# Patient Record
Sex: Female | Born: 1970
Health system: Southern US, Community
[De-identification: ages and names within clinical notes are randomized; demographics above are authoritative.]

## PROBLEM LIST (undated history)

## (undated) DIAGNOSIS — G4730 Sleep apnea, unspecified: Secondary | ICD-10-CM

## (undated) DIAGNOSIS — J45909 Unspecified asthma, uncomplicated: Secondary | ICD-10-CM

## (undated) DIAGNOSIS — E119 Type 2 diabetes mellitus without complications: Secondary | ICD-10-CM

## (undated) DIAGNOSIS — I10 Essential (primary) hypertension: Secondary | ICD-10-CM

## (undated) DIAGNOSIS — R112 Nausea with vomiting, unspecified: Secondary | ICD-10-CM

## (undated) DIAGNOSIS — F32A Depression, unspecified: Secondary | ICD-10-CM

## (undated) DIAGNOSIS — Z9889 Other specified postprocedural states: Secondary | ICD-10-CM

## (undated) DIAGNOSIS — T8859XA Other complications of anesthesia, initial encounter: Secondary | ICD-10-CM

## (undated) DIAGNOSIS — T4145XA Adverse effect of unspecified anesthetic, initial encounter: Secondary | ICD-10-CM

## (undated) DIAGNOSIS — F329 Major depressive disorder, single episode, unspecified: Secondary | ICD-10-CM

## (undated) DIAGNOSIS — L719 Rosacea, unspecified: Secondary | ICD-10-CM

## (undated) DIAGNOSIS — K469 Unspecified abdominal hernia without obstruction or gangrene: Secondary | ICD-10-CM

## (undated) DIAGNOSIS — F419 Anxiety disorder, unspecified: Secondary | ICD-10-CM

## (undated) DIAGNOSIS — K56609 Unspecified intestinal obstruction, unspecified as to partial versus complete obstruction: Secondary | ICD-10-CM

## (undated) DIAGNOSIS — K219 Gastro-esophageal reflux disease without esophagitis: Secondary | ICD-10-CM

## (undated) DIAGNOSIS — E282 Polycystic ovarian syndrome: Secondary | ICD-10-CM

## (undated) DIAGNOSIS — G43909 Migraine, unspecified, not intractable, without status migrainosus: Secondary | ICD-10-CM

## (undated) DIAGNOSIS — K589 Irritable bowel syndrome without diarrhea: Secondary | ICD-10-CM

## (undated) HISTORY — PX: OTHER SURGICAL HISTORY: SHX169

## (undated) HISTORY — PX: TONSILECTOMY, ADENOIDECTOMY, BILATERAL MYRINGOTOMY AND TUBES: SHX2538

## (undated) HISTORY — PX: ADENOIDECTOMY: SUR15

## (undated) HISTORY — DX: Anxiety disorder, unspecified: F41.9

## (undated) HISTORY — DX: Morbid (severe) obesity due to excess calories: E66.01

## (undated) HISTORY — PX: UMBILICAL HERNIA REPAIR: SHX196

## (undated) HISTORY — DX: Type 2 diabetes mellitus without complications: E11.9

## (undated) HISTORY — DX: Rosacea, unspecified: L71.9

## (undated) HISTORY — DX: Essential (primary) hypertension: I10

## (undated) HISTORY — DX: Major depressive disorder, single episode, unspecified: F32.9

## (undated) HISTORY — PX: BACK SURGERY: SHX140

## (undated) HISTORY — PX: HERNIA REPAIR: SHX51

## (undated) HISTORY — DX: Migraine, unspecified, not intractable, without status migrainosus: G43.909

## (undated) HISTORY — DX: Polycystic ovarian syndrome: E28.2

## (undated) HISTORY — DX: Irritable bowel syndrome, unspecified: K58.9

## (undated) HISTORY — DX: Sleep apnea, unspecified: G47.30

## (undated) HISTORY — DX: Gastro-esophageal reflux disease without esophagitis: K21.9

## (undated) HISTORY — DX: Unspecified asthma, uncomplicated: J45.909

## (undated) HISTORY — DX: Unspecified abdominal hernia without obstruction or gangrene: K46.9

## (undated) HISTORY — DX: Depression, unspecified: F32.A

---
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
## 1999-11-16 ENCOUNTER — Other Ambulatory Visit: Admission: RE | Admit: 1999-11-16 | Discharge: 1999-11-16 | Payer: Self-pay | Admitting: Obstetrics and Gynecology

## 2002-02-07 ENCOUNTER — Emergency Department (HOSPITAL_COMMUNITY): Admission: EM | Admit: 2002-02-07 | Discharge: 2002-02-07 | Payer: Self-pay | Admitting: Emergency Medicine

## 2002-02-12 ENCOUNTER — Encounter: Admission: RE | Admit: 2002-02-12 | Discharge: 2002-02-12 | Payer: Self-pay | Admitting: Family Medicine

## 2002-02-12 ENCOUNTER — Encounter: Payer: Self-pay | Admitting: Family Medicine

## 2002-03-03 ENCOUNTER — Encounter: Admission: RE | Admit: 2002-03-03 | Discharge: 2002-04-30 | Payer: Self-pay | Admitting: Family Medicine

## 2002-06-17 ENCOUNTER — Other Ambulatory Visit: Admission: RE | Admit: 2002-06-17 | Discharge: 2002-06-17 | Payer: Self-pay | Admitting: Gynecology

## 2003-06-13 ENCOUNTER — Other Ambulatory Visit: Admission: RE | Admit: 2003-06-13 | Discharge: 2003-06-13 | Payer: Self-pay | Admitting: Gynecology

## 2003-08-27 ENCOUNTER — Emergency Department (HOSPITAL_COMMUNITY): Admission: EM | Admit: 2003-08-27 | Discharge: 2003-08-27 | Payer: Self-pay | Admitting: Emergency Medicine

## 2004-06-11 ENCOUNTER — Emergency Department (HOSPITAL_COMMUNITY): Admission: EM | Admit: 2004-06-11 | Discharge: 2004-06-11 | Payer: Self-pay | Admitting: Family Medicine

## 2008-02-11 ENCOUNTER — Ambulatory Visit (HOSPITAL_COMMUNITY): Admission: RE | Admit: 2008-02-11 | Discharge: 2008-02-11 | Payer: Self-pay | Admitting: Obstetrics & Gynecology

## 2009-05-30 ENCOUNTER — Ambulatory Visit (HOSPITAL_COMMUNITY): Admission: RE | Admit: 2009-05-30 | Discharge: 2009-05-30 | Payer: Self-pay | Admitting: General Surgery

## 2010-12-07 LAB — DIFFERENTIAL
Basophils Absolute: 0.1 10*3/uL (ref 0.0–0.1)
Basophils Relative: 1 % (ref 0–1)
Eosinophils Absolute: 0.4 10*3/uL (ref 0.0–0.7)
Eosinophils Relative: 3 % (ref 0–5)
Lymphocytes Relative: 29 % (ref 12–46)
Lymphs Abs: 3.4 10*3/uL (ref 0.7–4.0)
Monocytes Absolute: 0.8 10*3/uL (ref 0.1–1.0)
Monocytes Relative: 7 % (ref 3–12)
Neutro Abs: 7 10*3/uL (ref 1.7–7.7)
Neutrophils Relative %: 60 % (ref 43–77)

## 2010-12-07 LAB — BASIC METABOLIC PANEL
BUN: 13 mg/dL (ref 6–23)
CO2: 31 mEq/L (ref 19–32)
Calcium: 9.8 mg/dL (ref 8.4–10.5)
Chloride: 100 mEq/L (ref 96–112)
Creatinine, Ser: 0.8 mg/dL (ref 0.4–1.2)
GFR calc Af Amer: >60 mL/min (ref >60)
GFR calc non Af Amer: >60 mL/min (ref >60)
Glucose, Bld: 93 mg/dL (ref 70–99)
Potassium: 4.7 mEq/L (ref 3.5–5.1)
Sodium: 138 mEq/L (ref 135–145)

## 2010-12-07 LAB — CBC
HCT: 45.5 % (ref 36.0–46.0)
Hemoglobin: 15.3 g/dL — ABNORMAL HIGH (ref 12.0–15.0)
MCHC: 33.7 g/dL (ref 30.0–36.0)
MCV: 88.7 fL (ref 78.0–100.0)
Platelets: 293 10*3/uL (ref 150–400)
RBC: 5.13 MIL/uL — ABNORMAL HIGH (ref 3.87–5.11)
RDW: 13.7 % (ref 11.5–15.5)
WBC: 11.7 10*3/uL — ABNORMAL HIGH (ref 4.0–10.5)

## 2010-12-07 LAB — GLUCOSE, CAPILLARY
Glucose-Capillary: 105 mg/dL — ABNORMAL HIGH (ref 70–99)
Glucose-Capillary: 163 mg/dL — ABNORMAL HIGH (ref 70–99)

## 2011-03-15 ENCOUNTER — Other Ambulatory Visit (INDEPENDENT_AMBULATORY_CARE_PROVIDER_SITE_OTHER): Payer: Self-pay | Admitting: General Surgery

## 2011-03-15 ENCOUNTER — Ambulatory Visit (HOSPITAL_COMMUNITY)
Admission: RE | Admit: 2011-03-15 | Discharge: 2011-03-15 | Disposition: A | Discharge status: Home / Self Care | Payer: 59 | Source: Ambulatory Visit | Attending: General Surgery | Admitting: General Surgery

## 2011-03-15 ENCOUNTER — Encounter (HOSPITAL_COMMUNITY)
Admission: RE | Admit: 2011-03-15 | Discharge: 2011-03-15 | Disposition: A | Discharge status: Home / Self Care | Payer: 59 | Source: Ambulatory Visit | Attending: General Surgery | Admitting: General Surgery

## 2011-03-15 DIAGNOSIS — R0989 Other specified symptoms and signs involving the circulatory and respiratory systems: Secondary | ICD-10-CM | POA: Insufficient documentation

## 2011-03-15 DIAGNOSIS — R05 Cough: Secondary | ICD-10-CM | POA: Insufficient documentation

## 2011-03-15 DIAGNOSIS — Z01811 Encounter for preprocedural respiratory examination: Secondary | ICD-10-CM

## 2011-03-15 DIAGNOSIS — Z01810 Encounter for preprocedural cardiovascular examination: Secondary | ICD-10-CM | POA: Insufficient documentation

## 2011-03-15 DIAGNOSIS — R059 Cough, unspecified: Secondary | ICD-10-CM | POA: Insufficient documentation

## 2011-03-15 DIAGNOSIS — Z01818 Encounter for other preprocedural examination: Secondary | ICD-10-CM | POA: Insufficient documentation

## 2011-03-15 DIAGNOSIS — K439 Ventral hernia without obstruction or gangrene: Secondary | ICD-10-CM

## 2011-03-15 DIAGNOSIS — Z01812 Encounter for preprocedural laboratory examination: Secondary | ICD-10-CM | POA: Insufficient documentation

## 2011-03-15 DIAGNOSIS — E119 Type 2 diabetes mellitus without complications: Secondary | ICD-10-CM | POA: Insufficient documentation

## 2011-03-15 DIAGNOSIS — I10 Essential (primary) hypertension: Secondary | ICD-10-CM | POA: Insufficient documentation

## 2011-03-15 LAB — CBC
HCT: 44 % (ref 36.0–46.0)
Hemoglobin: 15.5 g/dL — ABNORMAL HIGH (ref 12.0–15.0)
MCH: 30.6 pg (ref 26.0–34.0)
MCHC: 35.2 g/dL (ref 30.0–36.0)
MCV: 87 fL (ref 78.0–100.0)
Platelets: 245 10*3/uL (ref 150–400)
RBC: 5.06 MIL/uL (ref 3.87–5.11)
RDW: 13.1 % (ref 11.5–15.5)
WBC: 10.9 10*3/uL — ABNORMAL HIGH (ref 4.0–10.5)

## 2011-03-15 LAB — DIFFERENTIAL
Basophils Absolute: 0.1 10*3/uL (ref 0.0–0.1)
Basophils Relative: 1 % (ref 0–1)
Eosinophils Absolute: 0.5 10*3/uL (ref 0.0–0.7)
Eosinophils Relative: 5 % (ref 0–5)
Lymphocytes Relative: 27 % (ref 12–46)
Lymphs Abs: 3 10*3/uL (ref 0.7–4.0)
Monocytes Absolute: 0.7 10*3/uL (ref 0.1–1.0)
Monocytes Relative: 6 % (ref 3–12)
Neutro Abs: 6.7 10*3/uL (ref 1.7–7.7)
Neutrophils Relative %: 61 % (ref 43–77)

## 2011-03-15 LAB — BASIC METABOLIC PANEL
BUN: 15 mg/dL (ref 6–23)
CO2: 29 mEq/L (ref 19–32)
Calcium: 9.9 mg/dL (ref 8.4–10.5)
Chloride: 102 mEq/L (ref 96–112)
Creatinine, Ser: 0.72 mg/dL (ref 0.50–1.10)
GFR calc Af Amer: >60 mL/min (ref >60)
GFR calc non Af Amer: >60 mL/min (ref >60)
Glucose, Bld: 86 mg/dL (ref 70–99)
Potassium: 4.3 mEq/L (ref 3.5–5.1)
Sodium: 141 mEq/L (ref 135–145)

## 2011-03-15 LAB — SURGICAL PCR SCREEN
MRSA, PCR: NEGATIVE
Staphylococcus aureus: POSITIVE — AB

## 2011-03-18 ENCOUNTER — Ambulatory Visit (HOSPITAL_COMMUNITY)
Admission: RE | Admit: 2011-03-18 | Discharge: 2011-03-20 | Disposition: A | Discharge status: Home / Self Care | Payer: 59 | Source: Ambulatory Visit | Attending: General Surgery | Admitting: General Surgery

## 2011-03-18 DIAGNOSIS — K432 Incisional hernia without obstruction or gangrene: Secondary | ICD-10-CM | POA: Insufficient documentation

## 2011-03-18 DIAGNOSIS — Z01812 Encounter for preprocedural laboratory examination: Secondary | ICD-10-CM | POA: Insufficient documentation

## 2011-03-18 DIAGNOSIS — M129 Arthropathy, unspecified: Secondary | ICD-10-CM | POA: Insufficient documentation

## 2011-03-18 DIAGNOSIS — Z87891 Personal history of nicotine dependence: Secondary | ICD-10-CM | POA: Insufficient documentation

## 2011-03-18 DIAGNOSIS — E119 Type 2 diabetes mellitus without complications: Secondary | ICD-10-CM | POA: Insufficient documentation

## 2011-03-18 DIAGNOSIS — J45909 Unspecified asthma, uncomplicated: Secondary | ICD-10-CM | POA: Insufficient documentation

## 2011-03-18 DIAGNOSIS — I10 Essential (primary) hypertension: Secondary | ICD-10-CM | POA: Insufficient documentation

## 2011-03-18 DIAGNOSIS — E669 Obesity, unspecified: Secondary | ICD-10-CM | POA: Insufficient documentation

## 2011-03-18 DIAGNOSIS — K219 Gastro-esophageal reflux disease without esophagitis: Secondary | ICD-10-CM | POA: Insufficient documentation

## 2011-03-18 LAB — GLUCOSE, CAPILLARY
Glucose-Capillary: 103 mg/dL — ABNORMAL HIGH (ref 70–99)
Glucose-Capillary: 121 mg/dL — ABNORMAL HIGH (ref 70–99)

## 2011-03-20 LAB — URINALYSIS, ROUTINE W REFLEX MICROSCOPIC
Bilirubin Urine: NEGATIVE
Glucose, UA: NEGATIVE mg/dL
Hgb urine dipstick: NEGATIVE
Ketones, ur: NEGATIVE mg/dL
Leukocytes, UA: NEGATIVE
Nitrite: NEGATIVE
Protein, ur: NEGATIVE mg/dL
Specific Gravity, Urine: 1.007 (ref 1.005–1.030)
Urobilinogen, UA: 0.2 mg/dL (ref 0.0–1.0)
pH: 7 (ref 5.0–8.0)

## 2011-03-22 NOTE — Op Note (Signed)
[name], Vargas NO.:  [id number]  MEDICAL RECORD NO.:  [id number]  LOCATION:  5118                         FACILITY:  [facility]  PHYSICIAN:  Cherylynn Ridges, M.D.    DATE OF BIRTH:  06-25-1971  DATE OF PROCEDURE:  03/18/2011 DATE OF DISCHARGE:                              OPERATIVE REPORT   PREOPERATIVE DIAGNOSIS:  Ventral hernia.  POSTOPERATIVE DIAGNOSIS:  Recurrent supraumbilical ventral hernia.  PROCEDURE:  Laparoscopic ventral hernia repair with 20 x 25 cm Physio mesh.  SURGEON:  Marta Lamas. Lindie Spruce, MD  ASSISTANT:  Wilmon Arms. Tsuei, MD  ANESTHESIA:  General endotracheal.  ESTIMATED BLOOD LOSS:  Less than 20 mL.  No complications.  CONDITION:  Stable.  FINDINGS:  About a 4-cm defect in left upper portion of previous umbilical hernia repair where the mesh had torn through the fascia.  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  The patient was taken to the operating room and placed on table in supine position.  After an adequate general endotracheal anesthetic was administered, she was prepped and draped in the usual sterile manner exposing the entire abdomen.  We started off using a OptiVu 11/12 mm cannula and placed in left upper quadrant.  We eventually had to move it more laterally as the entrance site was close to the margin of the previous hernia.  However, we were able to work to it initially and place another right upper quadrant 10/11 mm cannula under direct vision and then 2 right lower quadrant 5- mm cannulas under direct vision.  We insufflated gas through the cannula passed initially.  We used a 30-degree scope in order to inspect the area of the hernia.  This was all done after proper time-out was performed identifying the patient and procedure to be performed.  We dissected out the omentum which was caught up in the hernia defect that was in the left  upper portion of the previously placed Proceed patch.  We pulled that down and dissected it away with minimal bleeding.  We marked the edges of the hernia defect using a spinal needle and then on the skin circle with a marking pen.  5 cm extended away from the margins of the hernia defect. We measured for our mesh.  We came out with a piece of 20 x 25 piece of Ultra Physio mesh which subsequently tacked with pull-through sutures of 0 Novafil.  We rolled up the piece of mesh with eight evenly spaced Novafil sutures in it through the left upper quadrant cannula.  We then evenly around circumferentially around the wound used a suture retriever and pulled the mesh up to the anterior abdominal wall.  It was not tight with that initially, however, we were able to tack it down using a SecureStrap tacker circumferentially.  We tied down all the eighth sutures initially.  We used SecureStrap to cover the defect.  At that point, all counts were correct.  It was evenly applied to the wound. There was minimal difficulty in apply it.  We allowed the gas to escape as we watched the mesh come down which completely  occluded the area. All counts were correct.  We removed all cannulas and gas.  We used local anesthetic lipolyse liposomal infusion in order to provide local anesthesia.  This was done at all of the sites.  We closed the 10/11 mm cannula skin sites using running subcuticular stitch of 4-0 Monocryl.  We used Dermabond, Steri-Strips, and Tegaderm to complete the dressings.  An abdominal binder was applied.  All counts were correct.     Cherylynn Ridges, M.D.     JOW/MEDQ  D:  03/18/2011  T:  03/19/2011  Job:  [id number]  cc:   Carren Rang, M.D. Tamera Punt, PA  Electronically Signed by Jimmye Norman M.D. on 03/22/2011 12:00:57 AM

## 2011-03-25 ENCOUNTER — Encounter (INDEPENDENT_AMBULATORY_CARE_PROVIDER_SITE_OTHER): Payer: Self-pay

## 2011-03-28 ENCOUNTER — Telehealth (INDEPENDENT_AMBULATORY_CARE_PROVIDER_SITE_OTHER): Payer: Self-pay

## 2011-03-28 DIAGNOSIS — Z8719 Personal history of other diseases of the digestive system: Secondary | ICD-10-CM

## 2011-03-28 MED ORDER — HYDROCODONE-ACETAMINOPHEN 5-325 MG PO TABS: 1.0000 | ORAL_TABLET | Freq: Four times a day (QID) | ORAL | Status: AC | PRN

## 2011-04-02 ENCOUNTER — Ambulatory Visit (INDEPENDENT_AMBULATORY_CARE_PROVIDER_SITE_OTHER): Payer: 59 | Admitting: General Surgery

## 2011-04-02 ENCOUNTER — Encounter (INDEPENDENT_AMBULATORY_CARE_PROVIDER_SITE_OTHER): Payer: Self-pay | Admitting: General Surgery

## 2011-04-02 DIAGNOSIS — Z09 Encounter for follow-up examination after completed treatment for conditions other than malignant neoplasm: Secondary | ICD-10-CM | POA: Insufficient documentation

## 2011-04-02 MED ORDER — OXYCODONE-ACETAMINOPHEN 7.5-500 MG PO TABS: 1.0000 | ORAL_TABLET | Freq: Four times a day (QID) | ORAL | Status: DC | PRN

## 2011-04-02 NOTE — Progress Notes (Signed)
HPI The patient is status post laparoscopic ventral hernia repair and doing well. She has some dimpling near some of her sutures but otherwise is doing well.  PE Excellent repair no evidence of recurrence of her hernia. She does have some dimpling and some of the suture sites which should resolve over time. P 60, BP 122/72 Suddenly after I left the room after initial evaluation the patient became slightly clammy diaphoretic and dizzy. Vital signs have been taken  Studiy review There are no studies reviewed today.  Assessment Doing well status post laparoscopic ventral hernia repair  Plan I will see the patient in 2 weeks for followup . If at that time she is doing well I will release her from clinic. I will also prescribe her an additional 25 tablets of Percocet for pain control.

## 2011-04-10 ENCOUNTER — Other Ambulatory Visit (INDEPENDENT_AMBULATORY_CARE_PROVIDER_SITE_OTHER): Payer: Self-pay | Admitting: General Surgery

## 2011-04-10 DIAGNOSIS — R52 Pain, unspecified: Secondary | ICD-10-CM

## 2011-04-10 MED ORDER — OXYCODONE-ACETAMINOPHEN 7.5-500 MG PO TABS: 1.0000 | ORAL_TABLET | Freq: Four times a day (QID) | ORAL | Status: AC | PRN

## 2011-04-16 ENCOUNTER — Encounter (INDEPENDENT_AMBULATORY_CARE_PROVIDER_SITE_OTHER): Payer: Self-pay | Admitting: General Surgery

## 2011-04-16 ENCOUNTER — Ambulatory Visit (INDEPENDENT_AMBULATORY_CARE_PROVIDER_SITE_OTHER): Payer: 59 | Admitting: General Surgery

## 2011-04-16 DIAGNOSIS — Z09 Encounter for follow-up examination after completed treatment for conditions other than malignant neoplasm: Secondary | ICD-10-CM

## 2011-04-16 NOTE — Progress Notes (Signed)
HPI The patient comes in doing extremely well from her surgery.  PE No evidence of recurrent hernia. All her wounds are healing well without evidence of infection  Studiy review There are no studies  Assessment Doing well status post laparoscopic ventral hernia repair.  Plan See the patient on a p.r.n. basis.

## 2012-12-09 ENCOUNTER — Other Ambulatory Visit: Payer: Self-pay | Admitting: Obstetrics & Gynecology

## 2012-12-10 NOTE — Telephone Encounter (Signed)
Patient states she is still seeing [facility]- she does not have diabetes- her A1C is 5.6 at last check. She is using the metformin for her PCOS management. Can we please refill? Patient has annual exam in June with Dr Tamela Oddi.

## 2012-12-10 NOTE — Telephone Encounter (Signed)
Rx should be forwarded to primary care provider

## 2012-12-11 ENCOUNTER — Other Ambulatory Visit: Payer: Self-pay | Admitting: Obstetrics & Gynecology

## 2013-02-17 ENCOUNTER — Encounter: Payer: Self-pay | Admitting: Obstetrics & Gynecology

## 2013-02-17 ENCOUNTER — Ambulatory Visit (INDEPENDENT_AMBULATORY_CARE_PROVIDER_SITE_OTHER): Payer: 59 | Admitting: Obstetrics & Gynecology

## 2013-02-17 VITALS — BP 138/85 | HR 85 | Temp 98.6°F | Ht 72.5 in | Wt 370.0 lb

## 2013-02-17 DIAGNOSIS — Z01419 Encounter for gynecological examination (general) (routine) without abnormal findings: Secondary | ICD-10-CM

## 2013-02-17 NOTE — Progress Notes (Signed)
Subjective:     Kelly Vargas is a 42 y.o. female here for a routine exam.  Current complaints: annual exam.  Personal health questionnaire reviewed: yes.   Gynecologic History Patient's last menstrual period was 01/25/2013. Contraception: none Last Pap: 10/2011 Results were: normal Last mammogram: n/a  Obstetric History OB History   Grav Para Term Preterm Abortions TAB SAB Ect Mult Living                   The following portions of the patient's history were reviewed and updated as appropriate: allergies, current medications, past family history, past medical history, past social history, past surgical history and problem list.  Review of Systems Pertinent items are noted in HPI.    Objective:    General appearance: alert Breasts: normal appearance, no masses or tenderness Abdomen: soft, non-tender; bowel sounds normal; no masses,  no organomegaly Pelvic: cervix normal in appearance, external genitalia normal, no adnexal masses or tenderness, uterus normal size, shape, and consistency and vagina normal without discharge    Assessment:    Healthy female exam.    Plan:   Return prn

## 2013-02-17 NOTE — Patient Instructions (Addendum)

## 2013-02-19 ENCOUNTER — Encounter: Payer: Self-pay | Admitting: Obstetrics & Gynecology

## 2013-12-29 ENCOUNTER — Other Ambulatory Visit: Payer: Self-pay | Admitting: Obstetrics & Gynecology

## 2014-01-03 NOTE — Telephone Encounter (Signed)
Please review

## 2014-01-07 ENCOUNTER — Encounter: Payer: Self-pay | Admitting: Obstetrics & Gynecology

## 2014-05-13 ENCOUNTER — Ambulatory Visit (INDEPENDENT_AMBULATORY_CARE_PROVIDER_SITE_OTHER): Payer: 59 | Admitting: Neurology

## 2014-05-13 ENCOUNTER — Encounter: Payer: Self-pay | Admitting: Neurology

## 2014-05-13 VITALS — BP 151/82 | HR 91 | Ht 72.0 in | Wt >= 6400 oz

## 2014-05-13 DIAGNOSIS — G478 Other sleep disorders: Secondary | ICD-10-CM

## 2014-05-13 DIAGNOSIS — G4750 Parasomnia, unspecified: Secondary | ICD-10-CM

## 2014-05-13 DIAGNOSIS — G4733 Obstructive sleep apnea (adult) (pediatric): Secondary | ICD-10-CM

## 2014-05-13 DIAGNOSIS — R351 Nocturia: Secondary | ICD-10-CM

## 2014-05-13 DIAGNOSIS — G4763 Sleep related bruxism: Secondary | ICD-10-CM

## 2014-05-13 DIAGNOSIS — IMO0002 Reserved for concepts with insufficient information to code with codable children: Secondary | ICD-10-CM

## 2014-05-13 NOTE — Patient Instructions (Signed)

## 2014-05-13 NOTE — Progress Notes (Signed)
Subjective:    Patient ID: Kelly Vargas is a 43 y.o. female.  HPI    Huston Foley, MD, PhD [facility] [address], [address] [address] [place], [place] [address]  Dear Lawerance Bach,   I saw your patient, Kelly Vargas, upon your kind request in my neurologic clinic today for initial consultation of her sleep disturbance, in particular concern for underlying obstructive sleep apnea. The patient is unaccompanied today. As you know, Kelly Vargas is a 43 year old right-handed woman with an underlying medical history of polycystic ovarian syndrome, IBS, hypertension, reflux disease, obesity, depression and anxiety, PTSD, status post ventral hernia and umbilical hernia repairs, lumbar laminectomy, tonsillectomy and adenoidectomy, who reports difficulty with sleep maintenance with frequent nighttime awakenings, snoring, nocturia, witnessed apneas (per wife/partner). She works from home for [employer]. She has had sleep walking and sleep eating into adulthood. She has tried Nyquil, Xanax, benadryl. She used to get morning HAs, but these improved since she starting using a custom made bite guard for bruxism. She has had nocturia x 2-3 times, but also nocturnal incontinence. She denies RLS symptoms and is not known to kick in her sleep.  Her typical bedtime is reported to be around MN and usual wake time is around 7:30 AM. Sleep onset typically occurs within minutes after reading on her Kindle. She reports feeling poorly rested upon awakening. She wakes up on an average 3 times in the middle of the night and has to go to the bathroom 2-3 times on a typical night.  She reports excessive daytime somnolence (EDS) and Her Epworth Sleepiness Score (ESS) is 6/24 today. She has not fallen asleep while driving. The patient has been taking a nap during lunch time at times.  She has been known to snore for the past many years. Snoring is reportedly marked, and associated with choking sounds and  witnessed apneas. The patient admits to a sense of choking or strangling feeling. There is report of nighttime reflux, and she has to sleep on 2 pillows. She prefers to sleep on the R side. There is no nighttime cough experienced. The patient has not noted any RLS symptoms and is not known to kick while asleep or before falling asleep. There is family history of insomnia, but no obvious RLS or OSA.  She is a restless sleeper and in the morning, the bed is quite disheveled.   She denies cataplexy, sleep paralysis, hypnagogic or hypnopompic hallucinations, or sleep attacks. She does report any vivid dreams, some nightmares, no dream enactments, but parasomnias, such as sleep eating, and sleep walking. The patient has not had a sleep study or a home sleep test.  She consumes 5-6 caffeinated beverages per day, usually in the form of coffee and sodas, as late as dinner.  Her bedroom is usually dark and cool. There is no TV in the bedroom.   Her Past Medical History Is Significant For: Past Medical History  Diagnosis Date  . Hypertension   . Rosacea   . Polycystic ovarian disease   . Morbid obesity   . Hernia   . GERD (gastroesophageal reflux disease)   . Migraine   . Depression   . Anxiety     Her Past Surgical History Is Significant For: Past Surgical History  Procedure Laterality Date  . Hernia repair    . Tonsilectomy, adenoidectomy, bilateral myringotomy and tubes    . Adenoidectomy    . Back surgery    . Ventrical hernia repair    .  Umbilical hernia repair      Her Family History Is Significant For: Family History  Problem Relation Age of Onset  . Heart disease Mother   . Cancer Mother   . Stroke Mother   . Diabetes Mother   . Hypertension Mother   . Heart disease Father   . Hypertension Brother   . High Cholesterol Brother   . Depression Brother     Her Social History Is Significant For: History   Social History  . Marital Status: Legally Separated    Spouse Name:  [name]    Number of Children: 0  . Years of Education: Market researcher.   Occupational History  .  [employer]   Social History Main Topics  . Smoking status: Former Smoker -- 1.00 packs/day for 20 years    Types: Cigarettes    Quit date: 09/02/2006  . Smokeless tobacco: None  . Alcohol Use: Yes     Comment: occasionally  . Drug Use: No  . Sexual Activity: Yes    Partners: Female   Other Topics Concern  . None   Social History Narrative   Patient lives at home with spouse.   Caffeine Use: 5-6 cups daily    Her Allergies Are:  Allergies  Allergen Reactions  . Contrast Media [Iodinated Diagnostic Agents]     Renal failure   . Erythromycin   . Penicillins   :   Her Current Medications Are:  Outpatient Encounter Prescriptions as of 05/13/2014  Medication Sig  . Albuterol (VENTOLIN IN) Inhale 2 Inhalers into the lungs 3 (three) times daily as needed.    . ALPRAZolam (XANAX) 0.5 MG tablet Take 0.5 mg by mouth 2 (two) times daily as needed.    Marland Kitchen aspirin 81 MG chewable tablet Chew 81 mg by mouth daily.  . Cholecalciferol (VITAMIN D PO) Take 5,000 mg by mouth daily.  Tery Sanfilippo Sodium (COLACE PO) Take 1 tablet by mouth daily.  Marland Kitchen escitalopram (LEXAPRO) 20 MG tablet Take 20 mg by mouth daily.    . famotidine (PEPCID) 10 MG tablet Take 10 mg by mouth 2 (two) times daily.  . fexofenadine (ALLEGRA) 180 MG tablet Take 180 mg by mouth daily.  . hydrochlorothiazide (HYDRODIURIL) 12.5 MG tablet Take 12.5 mg by mouth daily.    . metFORMIN (GLUCOPHAGE) 500 MG tablet Take 500 mg by mouth.    . metFORMIN (GLUMETZA) 500 MG (MOD) 24 hr tablet Take 500 mg by mouth 2 (two) times daily with a meal.    . Prenatal Vit-Fe Fumarate-FA (M-VIT PO) Take by mouth.    . [DISCONTINUED] Erythromycin 2 % ointment Apply topically at bedtime and may repeat dose one time if needed.    . [DISCONTINUED] hydrOXYzine (VISTARIL) 25 MG capsule Take 25 mg by mouth 4 (four) times daily as needed.    . [DISCONTINUED]  lisinopril-hydrochlorothiazide (PRINZIDE,ZESTORETIC) 20-12.5 MG per tablet Take 1 tablet by mouth daily.    . [DISCONTINUED] meloxicam (MOBIC) 15 MG tablet Take 15 mg by mouth daily.  . [DISCONTINUED] metFORMIN (GLUCOPHAGE-XR) 500 MG 24 hr tablet TAKE 1 TABLET BY MOUTH TWICE DAILY WITH MEALS  . [DISCONTINUED] predniSONE (DELTASONE) 20 MG tablet Take 20 mg by mouth daily.    . [DISCONTINUED] promethazine-dextromethorphan (PROMETHAZINE-DM) 6.25-15 MG/5ML syrup Take by mouth 4 (four) times daily as needed.    . [DISCONTINUED] trimethoprim-polymyxin b (POLYTRIM) ophthalmic solution 3 (three) times daily.    :  Review of Systems:  Out of a complete 14 point review of  systems, all are reviewed and negative with the exception of these symptoms as listed below:   Review of Systems  Constitutional: Positive for fatigue.  Respiratory:       Snoring  Gastrointestinal: Positive for constipation.       IBS  Genitourinary:       Incontinence   Musculoskeletal:       Joint pain, aching muscles  Allergic/Immunologic:       Sensitive skin  Psychiatric/Behavioral: Positive for sleep disturbance.       Anxiety, Depression, Decreased energy     Objective:  Neurologic Exam  Physical Exam Physical Examination:   Filed Vitals:   05/13/14 0819  BP: 151/82  Pulse: 91    General Examination: The patient is a very pleasant 43 y.o. female in no acute distress. She appears well-developed and well-nourished and well groomed. She is morbidly obese.   HEENT: Normocephalic, atraumatic, pupils are equal, round and reactive to light and accommodation. Funduscopic exam is normal with sharp disc margins noted. Extraocular tracking is good without limitation to gaze excursion or nystagmus noted. Normal smooth pursuit is noted. Hearing is grossly intact. Tympanic membranes are clear on the L, but obscured with cerumen on the R. ace is symmetric with normal facial animation and normal facial sensation. Speech is  clear with no dysarthria noted. There is no hypophonia. There is no lip, neck/head, jaw or voice tremor. Neck is supple with full range of passive and active motion. There are no carotid bruits on auscultation. Oropharynx exam reveals: mild mouth dryness, adequate dental hygiene and mild to moderate airway crowding, due to small airway opening and swollen uvula. Mallampati is class II. She has a significant amount of erythema in her pharynx and even hard palate. Tongue protrudes centrally and palate elevates symmetrically. Tonsils are absent. Neck size is 20 3/8 inches. She has a Mild overbite. Nasal inspection reveals no significant nasal mucosal bogginess or redness and no septal deviation.   Chest: Clear to auscultation without wheezing, rhonchi or crackles noted.  Heart: S1+S2+0, regular and normal without murmurs, rubs or gallops noted.   Abdomen: Soft, non-tender and non-distended with normal bowel sounds appreciated on auscultation.  Extremities: There is no pitting edema in the distal lower extremities bilaterally. Pedal pulses are intact.  Skin: Warm and dry without trophic changes noted. There are no varicose veins.  Musculoskeletal: exam reveals no obvious joint deformities, tenderness or joint swelling or erythema.   Neurologically:  Mental status: The patient is awake, alert and oriented in all 4 spheres. Her immediate and remote memory, attention, language skills and fund of knowledge are appropriate. There is no evidence of aphasia, agnosia, apraxia or anomia. Speech is clear with normal prosody and enunciation. Thought process is linear. Mood is normal and affect is normal.  Cranial nerves II - XII are as described above under HEENT exam. In addition: shoulder shrug is normal with equal shoulder height noted. Motor exam: Normal bulk, strength and tone is noted. There is no drift, tremor or rebound. Romberg is negative. Reflexes are 2+ throughout. Babinski: Toes are flexor  bilaterally. Fine motor skills and coordination: intact with normal finger taps, normal hand movements, normal rapid alternating patting, normal foot taps and normal foot agility.  Cerebellar testing: No dysmetria or intention tremor on finger to nose testing. Heel to shin is unremarkable bilaterally. There is no truncal or gait ataxia.  Sensory exam: intact to light touch, pinprick, vibration, temperature sense in the upper and lower extremities.  Gait, station and balance: She stands easily. No veering to one side is noted. No leaning to one side is noted. Posture is age-appropriate and stance is narrow based. Gait shows normal stride length and normal pace. No problems turning are noted. She turns en bloc. Tandem walk is unremarkable. Intact toe and heel stance is noted.               Assessment and Plan:   In summary, NIOMI VALENT is a very pleasant 43 y.o.-year old female with an underlying medical history of polycystic ovarian syndrome, IBS, hypertension, reflux disease, obesity, depression and anxiety, status post ventral hernia and umbilical hernia repairs, lumbar laminectomy, tonsillectomy and adenoidectomy, who reports difficulty with sleep maintenance with frequent nighttime awakenings, nocturia, snoring, and witnessed apneas, as well as parasomnias. Her history and physical exam are concerning for obstructive sleep apnea (OSA). I had a long chat with the patient about my findings and the diagnosis of OSA, its prognosis and treatment options. We talked about medical treatments, surgical interventions and non-pharmacological approaches. I explained in particular the risks and ramifications of untreated moderate to severe OSA, especially with respect to developing cardiovascular disease down the Road, including congestive heart failure, difficult to treat hypertension, cardiac arrhythmias, or stroke. Even type 2 diabetes has, in part, been linked to untreated OSA. Symptoms of untreated OSA  include daytime sleepiness, memory problems, mood irritability and mood disorder such as depression and anxiety, lack of energy, as well as recurrent headaches, especially morning headaches. We talked about trying to maintain a healthy lifestyle in general, as well as the importance of weight control. I encouraged the patient to eat healthy, exercise daily and keep well hydrated, to keep a scheduled bedtime and wake time routine, to not skip any meals and eat healthy snacks in between meals. I advised the patient not to drive when feeling sleepy. I recommended the following at this time: sleep study with potential positive airway pressure titration.  I explained the sleep test procedure to the patient and also outlined possible surgical and non-surgical treatment options of OSA, including the use of a custom-made dental device (which would require a referral to a specialist dentist or oral surgeon), upper airway surgical options, such as pillar implants, radiofrequency surgery, tongue base surgery, and UPPP (which would involve a referral to an ENT surgeon). Rarely, jaw surgery such as mandibular advancement may be considered.  I also explained the CPAP treatment option to the patient, who indicated that she would be willing to try CPAP if the need arises. I explained the importance of being compliant with PAP treatment, not only for insurance purposes but primarily to improve Her symptoms, and for the patient's long term health benefit, including to reduce Her cardiovascular risks. I answered all her questions today and the patient was in agreement. I would like to see her back after the sleep study is completed and encouraged her to call with any interim questions, concerns, problems or updates.   Thank you very much for allowing me to participate in the care of this nice patient. If I can be of any further assistance to you please do not hesitate to call me at [phone].  Sincerely,   Huston Foley,  MD, PhD

## 2014-06-07 ENCOUNTER — Ambulatory Visit: Payer: 59

## 2014-06-08 ENCOUNTER — Telehealth: Payer: Self-pay | Admitting: Neurology

## 2014-06-08 DIAGNOSIS — G4731 Primary central sleep apnea: Secondary | ICD-10-CM

## 2014-06-08 DIAGNOSIS — G4730 Sleep apnea, unspecified: Secondary | ICD-10-CM

## 2014-06-08 DIAGNOSIS — G4710 Hypersomnia, unspecified: Secondary | ICD-10-CM

## 2014-06-08 DIAGNOSIS — G4733 Obstructive sleep apnea (adult) (pediatric): Secondary | ICD-10-CM

## 2014-06-08 NOTE — Telephone Encounter (Signed)
Please call and notify patient that the recent sleep study confirmed the diagnosis of OSA. She did well with BiPAP during the study with significant improvement of the respiratory events. Therefore, I would like start the patient on BiPAP at home. I placed the order in the chart.   Arrange for PAP set up at home through a DME company of patient's choice and fax/route report to PCP and referring MD (if other than PCP).   The patient will also need a follow up appointment with me in 6-8 weeks post set up that has to be scheduled; help the patient schedule this (in a follow-up slot).   Please re-enforce the importance of compliance with treatment and the need for us to monitor compliance data.   Once you have spoken to the patient and scheduled the return appointment, you may close this encounter, thanks,   Huston FoleySaima Adilynn Bessey, MD, PhD [facility]

## 2014-06-09 ENCOUNTER — Other Ambulatory Visit: Payer: Self-pay | Admitting: *Deleted

## 2014-06-09 ENCOUNTER — Encounter: Payer: Self-pay | Admitting: Neurology

## 2014-06-09 DIAGNOSIS — R351 Nocturia: Secondary | ICD-10-CM

## 2014-06-09 DIAGNOSIS — G4763 Sleep related bruxism: Secondary | ICD-10-CM

## 2014-06-09 DIAGNOSIS — G478 Other sleep disorders: Secondary | ICD-10-CM

## 2014-06-09 DIAGNOSIS — G4733 Obstructive sleep apnea (adult) (pediatric): Secondary | ICD-10-CM

## 2014-06-09 DIAGNOSIS — G4750 Parasomnia, unspecified: Secondary | ICD-10-CM

## 2014-06-13 ENCOUNTER — Telehealth: Payer: Self-pay | Admitting: *Deleted

## 2014-06-13 NOTE — Telephone Encounter (Signed)
After leaving 3 messages we were finally able to contact this patient and provide results of her split night study.  Patient was informed there was severe obstructive sleep apnea observed in her test and that BIPAP therapy had been ordered by Dr. Frances FurbishAthar.  Patient was in agreement with starting therapy and was referred to [facility] for BIPAP set up.  Patient informed that we would mail a copy of her test results and that a copy would be faxed to Dr. Milagros Evenerupinder Kaur.

## 2014-06-13 NOTE — Telephone Encounter (Signed)
I left VM's times three on the patient's answering service with no response from the patient.  Final VM stated we would mail her out the results and provided her referring MD a copy of the results; Dr. Milagros Evenerupinder Kaur.

## 2014-07-01 ENCOUNTER — Telehealth: Payer: Self-pay | Admitting: *Deleted

## 2014-07-01 NOTE — Telephone Encounter (Signed)
[facility] medication- Metformin ER 500 mg 1 tablet twice daily

## 2014-07-03 NOTE — Telephone Encounter (Signed)
OK to refill

## 2014-07-05 ENCOUNTER — Other Ambulatory Visit: Payer: Self-pay | Admitting: *Deleted

## 2014-07-05 DIAGNOSIS — E669 Obesity, unspecified: Secondary | ICD-10-CM

## 2014-07-05 MED ORDER — METFORMIN HCL ER (MOD) 500 MG PO TB24
500.0000 mg | ORAL_TABLET | Freq: Two times a day (BID) | ORAL | Status: DC
Start: 2014-07-05 — End: 2019-02-10

## 2014-07-05 NOTE — Telephone Encounter (Signed)
Rx sent to [facility] via e scribe

## 2014-07-19 ENCOUNTER — Telehealth: Payer: Self-pay | Admitting: *Deleted

## 2014-07-19 NOTE — Telephone Encounter (Signed)
Patient contacted me with numerous questions and issues with her CPAP use.  Patient is however compliant in usage per [facility].  Patient is complaining of awakening with the CPAP mask making noises and there are documented leak issues.  [facility] is suppose to be sending her a different size mask.  Patient is also complaining of increased pedal edema, which should be improved with CPAP use.  Patient's current Rx settings are 21/17 cm ST with RR of 12.  In reviewing the study I think a slightly lower pressure still may be effective and make usage more effective, while reducing leak.  (19/15 cm ??)  Patient and [facility] will be advised of Dr. Teofilo PodAthar's decision and a follow up appointment will be scheduled in the 6-8 week window post PAP start.

## 2014-07-22 ENCOUNTER — Encounter: Payer: Self-pay | Admitting: Neurology

## 2014-07-26 ENCOUNTER — Telehealth: Payer: Self-pay | Admitting: *Deleted

## 2014-07-26 ENCOUNTER — Ambulatory Visit (INDEPENDENT_AMBULATORY_CARE_PROVIDER_SITE_OTHER): Payer: 59 | Admitting: Neurology

## 2014-07-26 ENCOUNTER — Encounter: Payer: Self-pay | Admitting: Neurology

## 2014-07-26 VITALS — BP 128/80 | HR 78 | Temp 98.0°F | Ht 72.0 in | Wt >= 6400 oz

## 2014-07-26 DIAGNOSIS — Z9989 Dependence on other enabling machines and devices: Secondary | ICD-10-CM

## 2014-07-26 DIAGNOSIS — G4733 Obstructive sleep apnea (adult) (pediatric): Secondary | ICD-10-CM

## 2014-07-26 DIAGNOSIS — G4731 Primary central sleep apnea: Secondary | ICD-10-CM

## 2014-07-26 NOTE — Patient Instructions (Signed)
Please continue using your BiPAP regularly. While your insurance requires that you use PAP at least 4 hours each night on 70% of the nights, I recommend, that you not skip any nights and use it throughout the night if you can. Getting used to PAP and staying with the treatment long term does take time and patience and discipline. Untreated obstructive sleep apnea when it is moderate to severe can have an adverse impact on cardiovascular health and raise her risk for heart disease, arrhythmias, hypertension, congestive heart failure, stroke and diabetes. Untreated obstructive sleep apnea causes sleep disruption, nonrestorative sleep, and sleep deprivation. This can have an impact on your day to day functioning and cause daytime sleepiness and impairment of cognitive function, memory loss, mood disturbance, and problems focussing. Using PAP regularly can improve these symptoms.  We will reduce your pressure some, in an effort to reduce the air leak. Keep an eye on your weight.

## 2014-07-26 NOTE — Telephone Encounter (Signed)
Pt was prescribed Glumetza at last visit.  Insurance is needing a prior approval.  In review with Dr Tamela OddiJackson Moore, she would like to know if pt has a PCP that she can follow up with.  Pt was given this Rx for obesity. Call placed to pt.  Left message advising pt to contact office to let us know if she has a PCP or would like referral in order for her to get medication that she needs.

## 2014-07-26 NOTE — Progress Notes (Signed)
Subjective:    Patient ID: Kelly Vargas is a 43 y.o. female.  HPI     Interim history:   Kelly Vargas is a 43 year old right-handed woman with an underlying medical history of polycystic ovarian syndrome, IBS, hypertension, reflux disease, obesity, depression and anxiety, PTSD, status post ventral hernia and umbilical hernia repairs, lumbar laminectomy, tonsillectomy and adenoidectomy, who presents for follow-up consultation of her obstructive sleep apnea. The patient is unaccompanied today. I first met her on 05/13/2014 at the request of her psychiatrist, at which time the patient reported difficulty with sleep maintenance with frequent nighttime awakenings, snoring, nocturia, and witnessed apneas. I asked her to return for a sleep study. She had a split-night sleep study on 06/07/2014 and went over her test results with her in detail today. Her baseline sleep efficiency was 62.9% with a latency to sleep of 15.5 minutes and wake after sleep onset of 21 minutes with moderate sleep fragmentation noted. She had an increased percentage of stage I sleep, an increased percentage of slow-wave sleep, and absence of REM sleep. No EKG changes were noted and no significant PLMS. She had loud snoring. She slept on her back only. Her total AHI was 61 per hour. Baseline oxygen saturation was only 88%, nadir was 80%. She was therefore started on CPAP therapy. Sleep efficiency was improved. She had an increased percentage of REM sleep at 39.3%. Average oxygen saturation was 89%, nadir was 78%. The patient was started on CPAP. She was first tried on a nasal mask but had significant mouth opening. She was titrated from 5-15 cm. She had significant residual sleep disordered breathing and therefore was switched to BiPAP therapy. She was titrated from 16/12 cm to 20/16 cm. She had central apneas. She was switched to BiPAP ST. She was titrated to a final pressure of 21/17 with a rate of 12. Residual AHI was 0 on the final  setting with her O2 nadir of 89% on the final setting. Based on her test results I tarted her on BiPAP ST at home.  Today, I reviewed her BiPAP compliance data from 06/21/2014 through 07/20/2014 which is a total of 30 days during which time she was under percent compliant. Percent used days greater than 4 hours was 100%, indicating superb compliance. Average usage of 8 hours and 17 minutes. Pressure at 21/17 with a rate of 12. Residual AHI 1 per hour, leak was high.  Today, she reports doing well, she sleeps better and she has not had nocturia. She feels more alert during the day. She has noted a 12 lb weight gain, which is unusual. She is very pleased with how she is doing.   She works from home for [employer]. She has had sleep walking and sleep eating into adulthood. She has tried Nyquil, Xanax, benadryl. She used to get morning HAs, but these improved since she starting using a custom made bite guard for bruxism. She has had nocturia x 2-3 times, but also nocturnal incontinence. She denies RLS symptoms and is not known to kick in her sleep.  Her typical bedtime is reported to be around MN and usual wake time is around 7:30 AM. Sleep onset typically occurs within minutes after reading on her Kindle. She reports feeling poorly rested upon awakening. She wakes up on an average 3 times in the middle of the night and has to go to the bathroom 2-3 times on a typical night.   She reports excessive daytime somnolence (EDS) and Her Epworth Sleepiness Score (  ESS) is 6/24 today. She has not fallen asleep while driving. The patient has been taking a nap during lunch time at times.   She has been known to snore for the past many years. Snoring is reportedly marked, and associated with choking sounds and witnessed apneas. The patient admits to a sense of choking or strangling feeling. There is report of nighttime reflux, and she has to sleep on 2 pillows. She prefers to sleep on the R side. There is no nighttime cough  experienced. The patient has not noted any RLS symptoms and is not known to kick while asleep or before falling asleep. There is family history of insomnia, but no obvious RLS or OSA.   She is a restless sleeper and in the morning, the bed is quite disheveled.    She denies cataplexy, sleep paralysis, hypnagogic or hypnopompic hallucinations, or sleep attacks. She does report any vivid dreams, some nightmares, no dream enactments, but parasomnias, such as sleep eating, and sleep walking. The patient has not had a sleep study or a home sleep test.   She consumes 5-6 caffeinated beverages per day, usually in the form of coffee and sodas, as late as dinner. Her bedroom is usually dark and cool. There is no TV in the bedroom.   Her Past Medical History Is Significant For: Past Medical History  Diagnosis Date  . Hypertension   . Rosacea   . Polycystic ovarian disease   . Morbid obesity   . Hernia   . GERD (gastroesophageal reflux disease)   . Migraine   . Depression   . Anxiety     Her Past Surgical History Is Significant For: Past Surgical History  Procedure Laterality Date  . Hernia repair    . Tonsilectomy, adenoidectomy, bilateral myringotomy and tubes    . Adenoidectomy    . Back surgery    . Ventrical hernia repair    . Umbilical hernia repair      Her Family History Is Significant For: Family History  Problem Relation Age of Onset  . Heart disease Mother   . Cancer Mother   . Stroke Mother   . Diabetes Mother   . Hypertension Mother   . Heart disease Father   . Hypertension Brother   . High Cholesterol Brother   . Depression Brother     Her Social History Is Significant For: History   Social History  . Marital Status: Legally Separated    Spouse Name: [name]    Number of Children: 0  . Years of Education: Programmer, systems.   Occupational History  .  [employer]   Social History Main Topics  . Smoking status: Former Smoker -- 1.00 packs/day for 20 years    Types:  Cigarettes    Quit date: 09/02/2006  . Smokeless tobacco: Never Used  . Alcohol Use: 0.0 oz/week    0 Not specified per week     Comment: occasionally  . Drug Use: No  . Sexual Activity:    Partners: Female   Other Topics Concern  . None   Social History Narrative   Patient lives at home with spouse.   Caffeine Use: 5-6 cups daily    Her Allergies Are:  Allergies  Allergen Reactions  . Contrast Media [Iodinated Diagnostic Agents]     Renal failure   . Erythromycin   . Penicillins   :   Her Current Medications Are:  Outpatient Encounter Prescriptions as of 07/26/2014  Medication Sig  . Albuterol (VENTOLIN  IN) Inhale 2 Inhalers into the lungs 3 (three) times daily as needed.    . ALPRAZolam (XANAX) 0.5 MG tablet Take 0.5 mg by mouth 2 (two) times daily as needed.    Marland Kitchen aspirin 81 MG chewable tablet Chew 81 mg by mouth daily.  . Cholecalciferol (VITAMIN D PO) Take 5,000 mg by mouth daily.  Mariane Baumgarten Sodium (COLACE PO) Take 1 tablet by mouth daily.  Marland Kitchen escitalopram (LEXAPRO) 20 MG tablet Take 20 mg by mouth daily.    . famotidine (PEPCID) 20 MG tablet Take 20 mg by mouth 2 (two) times daily.  . fexofenadine (ALLEGRA) 180 MG tablet Take 180 mg by mouth daily.  . hydrochlorothiazide (HYDRODIURIL) 12.5 MG tablet Take 12.5 mg by mouth daily.    . metFORMIN (GLUMETZA) 500 MG (MOD) 24 hr tablet Take 1 tablet (500 mg total) by mouth 2 (two) times daily with a meal.  . Prenatal Vit-Fe Fumarate-FA (M-VIT PO) Take by mouth.    . triamcinolone cream (KENALOG) 0.1 % As needed  . [DISCONTINUED] famotidine (PEPCID) 10 MG tablet Take 10 mg by mouth 2 (two) times daily.  . [DISCONTINUED] metFORMIN (GLUCOPHAGE) 500 MG tablet Take 500 mg by mouth.    :  Review of Systems:  Out of a complete 14 point review of systems, all are reviewed and negative with the exception of these symptoms as listed below:   Review of Systems  Constitutional: Positive for activity change.  HENT: Positive for  ear pain.   Eyes: Positive for itching.  Cardiovascular: Positive for leg swelling.  Endocrine:       Excessive thirst  Musculoskeletal: Positive for back pain.       Neck stiffness, aching muscles    Objective:  Neurologic Exam  Physical Exam Physical Examination:   Filed Vitals:   07/26/14 0814  BP: 128/80  Pulse: 78  Temp: 98 F (36.7 C)   General Examination: The patient is a very pleasant 43 y.o. female in no acute distress. She appears well-developed and well-nourished and well groomed. She is morbidly obese.   HEENT: Normocephalic, atraumatic, pupils are equal, round and reactive to light and accommodation. Funduscopic exam is normal with sharp disc margins noted. Extraocular tracking is good without limitation to gaze excursion or nystagmus noted. Normal smooth pursuit is noted. Hearing is grossly intact. Fais symmetric with normal facial animation and normal facial sensation. Speech is clear with no dysarthria noted. There is no hypophonia. There is no lip, neck/head, jaw or voice tremor. Neck is supple with full range of passive and active motion. There are no carotid bruits on auscultation. Oropharynx exam reveals: mild mouth dryness, adequate dental hygiene and mild to moderate airway crowding, due to small airway opening and swollen uvula. Mallampati is class II. She has a significant amount of erythema in her pharynx and even hard palate. Tongue protrudes centrally and palate elevates symmetrically. Tonsils are absent. She has a Mild overbite. Nasal inspection reveals no significant nasal mucosal bogginess or redness and no septal deviation.   Chest: Clear to auscultation without wheezing, rhonchi or crackles noted.  Heart: S1+S2+0, regular and normal without murmurs, rubs or gallops noted.   Abdomen: Soft, non-tender and non-distended with normal bowel sounds appreciated on auscultation.  Extremities: There is trace edema in the distal lower extremities bilaterally.  Pedal pulses are intact.  Skin: Warm and dry without trophic changes noted. There are no varicose veins.  Musculoskeletal: exam reveals no obvious joint deformities, tenderness or joint swelling  or erythema.   Neurologically:  Mental status: The patient is awake, alert and oriented in all 4 spheres. Her immediate and remote memory, attention, language skills and fund of knowledge are appropriate. There is no evidence of aphasia, agnosia, apraxia or anomia. Speech is clear with normal prosody and enunciation. Thought process is linear. Mood is normal and affect is normal.  Cranial nerves II - XII are as described above under HEENT exam. In addition: shoulder shrug is normal with equal shoulder height noted. Motor exam: Normal bulk, strength and tone is noted. There is no drift, tremor or rebound. Romberg is negative. Reflexes are 1+ throughout. Babinski: Toes are flexor bilaterally. Fine motor skills and coordination: intact with normal finger taps, normal hand movements, normal rapid alternating patting, normal foot taps and normal foot agility.  Cerebellar testing: No dysmetria or intention tremor on finger to nose testing. Heel to shin is unremarkable bilaterally. There is no truncal or gait ataxia.  Sensory exam: intact to light touch, pinprick, vibration, temperature sense in the upper and lower extremities.  Gait, station and balance: She stands easily. No veering to one side is noted. No leaning to one side is noted. Posture is age-appropriate and stance is narrow based. Gait shows normal stride length and normal pace. No problems turning are noted. She turns en bloc. Tandem walk is unremarkable.   Assessment and Plan:   In summary, MCKENZEE BEEM is a very pleasant 43 year old female with an underlying medical history of polycystic ovarian syndrome, IBS, hypertension, reflux disease, obesity, depression and anxiety, status post ventral hernia and umbilical hernia repairs, lumbar laminectomy,  tonsillectomy and adenoidectomy, who presents for follow up consultation of her severe OSA as demonstrated with a recent split night sleep study. She has evidence of central sleep apnea and CPAP did not help her sleep disordered breathing with residual obstructive sleep apnea noted while on CPAP and evidence of significant central apneas. She was placed on BiPAP then on BiPAP ST which helped eliminate her central apneas completely. She is on a rather high pressure. This is probably part of the reason why her leak is so high. She has done very well with treatment and feels much improved. Nevertheless, in an effort to reduce her leak I would like for her to get a mask refit and I would like to reduce her pressure 219/15 cm with the same rate of 12/m. She is congratulated on her superb compliance. I'm pleased that she feels so much better. She has however gained some weight and we should watch this carefully. Typically treatment of her obstructive sleep apnea results in weight loss but it may be too early to tell. It also helps improve edema. I would like to see her back in 3 months. Her exam is stable. I explained the importance of being compliant with PAP treatment, not only for insurance purposes but primarily to improve Her symptoms, and for the patient's long term health benefit, including to reduce Her cardiovascular risks. We talked about her sleep test results in detail and I explained her compliance data to hers well today. I answered all her questions today and the patient was in agreement. I encouraged her to call with any interim questions, concerns, problems or updates.

## 2014-08-09 ENCOUNTER — Telehealth: Payer: Self-pay

## 2014-08-09 ENCOUNTER — Other Ambulatory Visit: Payer: Self-pay | Admitting: *Deleted

## 2014-08-09 DIAGNOSIS — Z7689 Persons encountering health services in other specified circumstances: Secondary | ICD-10-CM

## 2014-08-09 NOTE — Progress Notes (Signed)
Called to discuss medication and insurance coverage with pt.  Pt states that she does not have a PCP and would like a referral.  Pt made aware that referral would be sent.  Pt made aware that we will contact insurance regarding medication and let her know if there are further concerns.

## 2014-08-09 NOTE — Telephone Encounter (Signed)
Told patient to call [facility] - they are taking new patients - [facility] is not - gave her number and Dr. Jocelyn LamerBetti Reese's name

## 2014-08-29 ENCOUNTER — Encounter: Payer: Self-pay | Admitting: *Deleted

## 2014-08-30 ENCOUNTER — Encounter: Payer: Self-pay | Admitting: Obstetrics & Gynecology

## 2014-10-24 ENCOUNTER — Ambulatory Visit: Payer: 59 | Admitting: Neurology

## 2014-10-27 ENCOUNTER — Ambulatory Visit (INDEPENDENT_AMBULATORY_CARE_PROVIDER_SITE_OTHER): Payer: 59 | Admitting: Neurology

## 2014-10-27 ENCOUNTER — Encounter: Payer: Self-pay | Admitting: Neurology

## 2014-10-27 VITALS — BP 124/81 | HR 65 | Temp 98.2°F | Ht 72.0 in | Wt >= 6400 oz

## 2014-10-27 DIAGNOSIS — G4731 Primary central sleep apnea: Secondary | ICD-10-CM

## 2014-10-27 DIAGNOSIS — G4733 Obstructive sleep apnea (adult) (pediatric): Secondary | ICD-10-CM

## 2014-10-27 DIAGNOSIS — Z9989 Dependence on other enabling machines and devices: Secondary | ICD-10-CM

## 2014-10-27 NOTE — Patient Instructions (Signed)
We will reduce your pressure setting to 19/15 cm to help with tolerance and reduce the leak.

## 2014-10-27 NOTE — Progress Notes (Signed)
Subjective:    Patient ID: Kelly Vargas is a 44 y.o. female.  HPI     Interim history:   Kelly Vargas is a 44 year old right-handed woman with an underlying medical history of polycystic ovarian syndrome, IBS, hypertension, reflux disease, obesity, depression and anxiety, PTSD, status post ventral hernia and umbilical hernia repairs, lumbar laminectomy, tonsillectomy and adenoidectomy, who presents for follow-up consultation of her sleep disordered breathing, on BiPAP ST treatment. The patient is unaccompanied today. I last saw her on 07/26/2014, at which time we talked about her sleep test results and her compliance data. She had obstructive sleep apnea which did not respond completely to CPAP therapy and standard BiPAP therapy and she started having central apneas on standard BiPAP. She was therefore switched to BiPAP ST. She had good compliance and indicated good results. She reported sleeping better and improvement of her nocturia as well as improved daytime alertness. Leak was high and I suggested we request a mask refit and reduction in BiPAP pressure 219/15 but both things were actually not gone. I have emailed her DME provider today. The patient states that she was mailed another fullface mask but this was even worse in terms of fit and her mouth would come open. She is trying to get on track with her weight loss.   Today, I reviewed her BiPAP ST compliance data from 09/24/2014 through 10/23/2014 which is a total of 30 days during which time she was under percent compliant. Average usage of 7 hours and 52 minutes with a pressure of 21/17 and the rate of 12. Residual AHI at 1 per hour and leak at times high with the 95th percentile at 75.6 L/m.  Today, she reports doing fairly well. Unfortunately, leak is still high. She still has lower extremity edema which is no better. Her blood pressure values have been good. Her nocturia is much improved from before treatment thankfully. Unfortunately, she  still has air leaking from her mask and has tightened it to much to where she now has pressure marks on her face. She also has never received a pressure reduction that I had requested 3 months ago.  I first met her on 05/13/2014 at the request of her psychiatrist, at which time the patient reported difficulty with sleep maintenance with frequent nighttime awakenings, snoring, nocturia, and witnessed apneas. I asked her to return for a sleep study. She had a split-night sleep study on 06/07/2014 and went over her test results with her in detail today. Her baseline sleep efficiency was 62.9% with a latency to sleep of 15.5 minutes and wake after sleep onset of 21 minutes with moderate sleep fragmentation noted. She had an increased percentage of stage I sleep, an increased percentage of slow-wave sleep, and absence of REM sleep. No EKG changes were noted and no significant PLMS. She had loud snoring. She slept on her back only. Her total AHI was 61 per hour. Baseline oxygen saturation was only 88%, nadir was 80%. She was therefore started on CPAP therapy. Sleep efficiency was improved. She had an increased percentage of REM sleep at 39.3%. Average oxygen saturation was 89%, nadir was 78%. The patient was started on CPAP. She was first tried on a nasal mask but had significant mouth opening. She was titrated from 5-15 cm. She had significant residual sleep disordered breathing and therefore was switched to BiPAP therapy. She was titrated from 16/12 cm to 20/16 cm. She had central apneas. She was switched to BiPAP ST. She was titrated  to a final pressure of 21/17 with a rate of 12. Residual AHI was 0 on the final setting with her O2 nadir of 89% on the final setting. Based on her test results I tarted her on BiPAP ST at home.  I reviewed her BiPAP compliance data from 06/21/2014 through 07/20/2014 which is a total of 30 days during which time she was under percent compliant. Percent used days greater than 4 hours  was 100%, indicating superb compliance. Average usage of 8 hours and 17 minutes. Pressure at 21/17 with a rate of 12. Residual AHI 1 per hour, leak was high.  She works from home for [employer]. She has had sleep walking and sleep eating into adulthood. She has tried Nyquil, Xanax, benadryl. She used to get morning HAs, but these improved since she starting using a custom made bite guard for bruxism. She has had nocturia x 2-3 times, but also nocturnal incontinence. She denies RLS symptoms and is not known to kick in her sleep.   Her typical bedtime is reported to be around MN and usual wake time is around 7:30 AM. Sleep onset typically occurs within minutes after reading on her Kindle. She reports feeling poorly rested upon awakening. She wakes up on an average 3 times in the middle of the night and has to go to the bathroom 2-3 times on a typical night.   She reports excessive daytime somnolence (EDS) and Her Epworth Sleepiness Score (ESS) is 6/24 today. She has not fallen asleep while driving. The patient has been taking a nap during lunch time at times.   She has been known to snore for the past many years. Snoring is reportedly marked, and associated with choking sounds and witnessed apneas. The patient admits to a sense of choking or strangling feeling. There is report of nighttime reflux, and she has to sleep on 2 pillows. She prefers to sleep on the R side. There is no nighttime cough experienced. The patient has not noted any RLS symptoms and is not known to kick while asleep or before falling asleep. There is family history of insomnia, but no obvious RLS or OSA.   She is a restless sleeper and in the morning, the bed is quite disheveled.    She denies cataplexy, sleep paralysis, hypnagogic or hypnopompic hallucinations, or sleep attacks. She does report any vivid dreams, some nightmares, no dream enactments, but parasomnias, such as sleep eating, and sleep walking. The patient has not had a sleep study or  a home sleep test.   She consumes 5-6 caffeinated beverages per day, usually in the form of coffee and sodas, as late as dinner. Her bedroom is usually dark and cool. There is no TV in the bedroom.   Her Past Medical History Is Significant For: Past Medical History  Diagnosis Date  . Hypertension   . Rosacea   . Polycystic ovarian disease   . Morbid obesity   . Hernia   . GERD (gastroesophageal reflux disease)   . Migraine   . Depression   . Anxiety     Her Past Surgical History Is Significant For: Past Surgical History  Procedure Laterality Date  . Hernia repair    . Tonsilectomy, adenoidectomy, bilateral myringotomy and tubes    . Adenoidectomy    . Back surgery    . Ventrical hernia repair    . Umbilical hernia repair      Her Family History Is Significant For: Family History  Problem Relation Age of Onset  .  Heart disease Mother   . Cancer Mother   . Stroke Mother   . Diabetes Mother   . Hypertension Mother   . Heart disease Father   . Hypertension Brother   . High Cholesterol Brother   . Depression Brother     Her Social History Is Significant For: History   Social History  . Marital Status: Legally Separated    Spouse Name: Kelly Vargas  . Number of Children: 0  . Years of Education: Asocc.   Occupational History  .  [employer]   Social History Main Topics  . Smoking status: Former Smoker -- 1.00 packs/day for 20 years    Types: Cigarettes    Quit date: 09/02/2006  . Smokeless tobacco: Never Used  . Alcohol Use: 0.0 oz/week    0 Standard drinks or equivalent per week     Comment: occasionally  . Drug Use: No  . Sexual Activity:    Partners: Female   Other Topics Concern  . None   Social History Narrative   Patient lives at home with spouse.   Caffeine Use: 5-6 cups daily    Her Allergies Are:  Allergies  Allergen Reactions  . Contrast Media [Iodinated Diagnostic Agents]     Renal failure   . Erythromycin   . Penicillins   :    Her Current Medications Are:  Outpatient Encounter Prescriptions as of 10/27/2014  Medication Sig  . Albuterol (VENTOLIN IN) Inhale 2 Inhalers into the lungs 3 (three) times daily as needed.    . ALPRAZolam (XANAX) 0.5 MG tablet Take 0.5 mg by mouth 2 (two) times daily as needed.    Marland Kitchen aspirin 81 MG chewable tablet Chew 81 mg by mouth daily.  . Cholecalciferol (VITAMIN D PO) Take 5,000 mg by mouth daily.  Kelly Vargas Sodium (COLACE PO) Take 1 tablet by mouth daily.  Marland Kitchen escitalopram (LEXAPRO) 20 MG tablet Take 20 mg by mouth daily.    . famotidine (PEPCID) 20 MG tablet Take 20 mg by mouth 2 (two) times daily.  . fexofenadine (ALLEGRA) 180 MG tablet Take 180 mg by mouth daily.  Marland Kitchen FLUVIRIN SUSP   . hydrochlorothiazide (HYDRODIURIL) 12.5 MG tablet Take 12.5 mg by mouth daily.    . metFORMIN (GLUMETZA) 500 MG (MOD) 24 hr tablet Take 1 tablet (500 mg total) by mouth 2 (two) times daily with a meal.  . Prenatal Vit-Fe Fumarate-FA (M-VIT PO) Take by mouth.    . triamcinolone cream (KENALOG) 0.1 % As needed  :  Review of Systems:  Out of a complete 14 point review of systems, all are reviewed and negative with the exception of these symptoms as listed below:   Review of Systems  All other systems reviewed and are negative.   Objective:  Neurologic Exam  Physical Exam Physical Examination:   Filed Vitals:   10/27/14 0817  BP: 124/81  Pulse: 65  Temp: 98.2 F (36.8 C)   General Examination: The patient is a very pleasant 44 y.o. female in no acute distress. She appears well-developed and well-nourished and well groomed. She is morbidly obese.   HEENT: Normocephalic, atraumatic, pupils are equal, round and reactive to light and accommodation. Funduscopic exam is normal with sharp disc margins noted. Extraocular tracking is good without limitation to gaze excursion or nystagmus noted. Normal smooth pursuit is noted. Hearing is grossly intact. Face is symmetric with normal facial animation  and normal facial sensation. she has erythematous pressure marks across the nasal bridge and  her cheeks. Speech is clear with no dysarthria noted. There is no hypophonia. There is no lip, neck/head, jaw or voice tremor. Neck is supple with full range of passive and active motion. There are no carotid bruits on auscultation. Oropharynx exam reveals: mild mouth dryness, adequate dental hygiene and mild to moderate airway crowding, due to small airway opening and swollen uvula. Mallampati is class II. She has a significant amount of erythema in her pharynx and even hard palate. Tongue protrudes centrally and palate elevates symmetrically. Tonsils are absent. She has a Mild overbite. Nasal inspection reveals no significant nasal mucosal bogginess or redness and no septal deviation.   Chest: Clear to auscultation without wheezing, rhonchi or crackles noted.  Heart: S1+S2+0, regular and normal without murmurs, rubs or gallops noted.   Abdomen: Soft, non-tender and non-distended with normal bowel sounds appreciated on auscultation.  Extremities: There is trace edema in the distal lower extremities bilaterally. Pedal pulses are intact.  Skin: Warm and dry without trophic changes noted. There are no varicose veins.  Musculoskeletal: exam reveals no obvious joint deformities, tenderness or joint swelling or erythema.   Neurologically:  Mental status: The patient is awake, alert and oriented in all 4 spheres. Her immediate and remote memory, attention, language skills and fund of knowledge are appropriate. There is no evidence of aphasia, agnosia, apraxia or anomia. Speech is clear with normal prosody and enunciation. Thought process is linear. Mood is normal and affect is normal.  Cranial nerves II - XII are as described above under HEENT exam. In addition: shoulder shrug is normal with equal shoulder height noted. Motor exam: Normal bulk, strength and tone is noted. There is no drift, tremor or rebound.  Romberg is negative. Reflexes are 1+ throughout. Babinski: Toes are flexor bilaterally. Fine motor skills and coordination: intact with normal finger taps, normal hand movements, normal rapid alternating patting, normal foot taps and normal foot agility.  Cerebellar testing: No dysmetria or intention tremor on finger to nose testing. Heel to shin is very difficult for her due to body habitus. There is no truncal or gait ataxia.  Sensory exam: intact to light touch, pinprick, vibration, temperature sense in the upper and lower extremities.  Gait, station and balance: She stands easily. No veering to one side is noted. No leaning to one side is noted. Posture is age-appropriate and stance is narrow based. Gait shows normal stride length and normal pace. No problems turning are noted. She turns en bloc. Tandem walk is unremarkable.   Assessment and Plan:   In summary, Kelly Vargas is a very pleasant 44 year old female with an underlying medical history of polycystic ovarian syndrome, IBS, hypertension, reflux disease,  morbid obesity, depression and anxiety, status post ventral hernia and umbilical hernia repairs, lumbar laminectomy, tonsillectomy and adenoidectomy, who presents for follow up consultation of her severe OSA as demonstrated with a recent split night sleep study. She had evidence of  Severe central sleep apnea and CPAP did not help her sleep disordered breathing with residual obstructive sleep apnea noted while on CPAP and evidence of significant central apneas. She was placed on BiPAP then on BiPAP ST which helped eliminate her central apneas completely. She is on a rather high pressure. This is probably part of the reason why her leak is so high. She has done very well with treatment and feels much improved. Nevertheless, in an effort to reduce her leak I would like for her to get a mask refit and I would  like to reduce her pressure to 19/15 cm with the same rate of 12/m. She is congratulated  on her superb compliance.  we are both pleased that she is feeling better and in particular her nocturia is only once at night or none sometimes which is much improved. Nevertheless, she is reminded to work hard on weight loss, we will make these changes in her mask and her pressure and I will see him back in 3 months. Her physical exam is otherwise stable. I went over her sleep study results again from October 2015 and her most recent compliance data. I explained the importance of being compliant with PAP treatment, not only for insurance purposes but primarily to improve Her symptoms, and for the patient's long term health benefit, including to reduce Her cardiovascular risks. I answered all her questions today and the patient was in agreement. I encouraged her to call with any interim questions, concerns, problems or updates.

## 2014-11-07 ENCOUNTER — Encounter: Payer: Self-pay | Admitting: Neurology

## 2015-02-13 ENCOUNTER — Ambulatory Visit: Payer: 59 | Admitting: Neurology

## 2015-02-20 ENCOUNTER — Ambulatory Visit (INDEPENDENT_AMBULATORY_CARE_PROVIDER_SITE_OTHER): Payer: 59 | Admitting: Neurology

## 2015-02-20 ENCOUNTER — Encounter: Payer: Self-pay | Admitting: Neurology

## 2015-02-20 VITALS — BP 142/90 | HR 72 | Resp 20 | Ht 72.0 in | Wt >= 6400 oz

## 2015-02-20 DIAGNOSIS — G4731 Primary central sleep apnea: Secondary | ICD-10-CM | POA: Diagnosis not present

## 2015-02-20 DIAGNOSIS — Z9989 Dependence on other enabling machines and devices: Secondary | ICD-10-CM

## 2015-02-20 DIAGNOSIS — G4733 Obstructive sleep apnea (adult) (pediatric): Secondary | ICD-10-CM | POA: Diagnosis not present

## 2015-02-20 NOTE — Progress Notes (Signed)
Subjective:    Vargas ID: Kelly Vargas is a 44 y.o. female.  HPI    Interim history:  Kelly Vargas is a 44 year old right-handed woman with an underlying medical history of polycystic ovarian syndrome, IBS, hypertension, reflux disease, obesity, depression and anxiety, PTSD, status post ventral hernia and umbilical hernia repairs, lumbar laminectomy, tonsillectomy and adenoidectomy, who presents for follow-up consultation of her sleep disordered breathing, on BiPAP ST treatment. Kelly Vargas is unaccompanied today. I last saw her on  10/27/2014, at which time she reported doing fairly well. Unfortunately, leak from Kelly mask was still high. Her lower extremity edema was no better. Her blood pressure values were good. Her nocturia was much improved from before treatment. I requested a reduction in her pressure to 19/15 cm.  Today, 02/20/2015: I reviewed her BiPAP compliance data from 01/18/2015 through 02/16/2015 which is a total of 30 days during which time she used her machine every night, percent used days greater than 4 hours was under percent, indicating superb compliance with an average usage of 8 hours and 34 minutes for all nights, residual AHI low at 0.2 per hour but leak still high, albeit improved with Kelly 95th percentile at 63.2 L/m. Pressure at 19/15 with a rate of 12.  Today, 02/20/2015: She reports doing fairly well. She is trying to lose weight. She has lost about 11 pounds. She is going to start grad school in July. She is in a [employer]. She is going to establish care with a new primary care physician later this month. She is now using a smaller fullface mask. Her leak has improved but is still quite noticeable to her as well. She has adjusted to Kelly lower pressure well.  Previously:   I saw her on 07/26/2014, at which time we talked about her sleep test results and her compliance data. She had obstructive sleep apnea which did not respond completely to CPAP therapy and  standard BiPAP therapy and she started having central apneas on standard BiPAP. She was therefore switched to BiPAP ST. She had good compliance and indicated good results. She reported sleeping better and improvement of her nocturia as well as improved daytime alertness. Leak was high and I suggested we request a mask refit and reduction in BiPAP pressure 19/15 cm but both things were actually not gone. I have emailed her DME provider today. Kelly Vargas states that she was mailed another fullface mask but this was even worse in terms of fit and her mouth would come open. She is trying to get on track with her weight loss.   I reviewed her BiPAP ST compliance data from 09/24/2014 through 10/23/2014 which is a total of 30 days during which time she was under percent compliant. Average usage of 7 hours and 52 minutes with a pressure of 21/17 and Kelly rate of 12. Residual AHI at 1 per hour and leak at times high with Kelly 95th percentile at 75.6 L/m.  I first met her on 05/13/2014 at Kelly request of her psychiatrist, at which time Kelly Vargas reported difficulty with sleep maintenance with frequent nighttime awakenings, snoring, nocturia, and witnessed apneas. I asked her to return for a sleep study. She had a split-night sleep study on 06/07/2014 and went over her test results with her in detail today. Her baseline sleep efficiency was 62.9% with a latency to sleep of 15.5 minutes and wake after sleep onset of 21 minutes with moderate sleep fragmentation noted. She had an increased percentage of  stage I sleep, an increased percentage of slow-wave sleep, and absence of REM sleep. No EKG changes were noted and no significant PLMS. She had loud snoring. She slept on her back only. Her total AHI was 61 per hour. Baseline oxygen saturation was only 88%, nadir was 80%. She was therefore started on CPAP therapy. Sleep efficiency was improved. She had an increased percentage of REM sleep at 39.3%. Average oxygen saturation  was 89%, nadir was 78%. Kelly Vargas was started on CPAP. She was first tried on a nasal mask but had significant mouth opening. She was titrated from 5-15 cm. She had significant residual sleep disordered breathing and therefore was switched to BiPAP therapy. She was titrated from 16/12 cm to 20/16 cm. She had central apneas. She was switched to BiPAP ST. She was titrated to a final pressure of 21/17 with a rate of 12. Residual AHI was 0 on Kelly final setting with her O2 nadir of 89% on Kelly final setting. Based on her test results I tarted her on BiPAP ST at home.  I reviewed her BiPAP compliance data from 06/21/2014 through 07/20/2014 which is a total of 30 days during which time she was under percent compliant. Percent used days greater than 4 hours was 100%, indicating superb compliance. Average usage of 8 hours and 17 minutes. Pressure at 21/17 with a rate of 12. Residual AHI 1 per hour, leak was high.  She works from home for [employer]. She has had sleep walking and sleep eating into adulthood. She has tried Nyquil, Xanax, benadryl. She used to get morning HAs, but these improved since she starting using a custom made bite guard for bruxism. She has had nocturia x 2-3 times, but also nocturnal incontinence. She denies RLS symptoms and is not known to kick in her sleep.   Her typical bedtime is reported to be around MN and usual wake time is around 7:30 AM. Sleep onset typically occurs within minutes after reading on her Kindle. She reports feeling poorly rested upon awakening. She wakes up on an average 3 times in Kelly middle of Kelly night and has to go to Kelly bathroom 2-3 times on a typical night.   She reports excessive daytime somnolence (EDS) and Her Epworth Sleepiness Score (ESS) is 6/24 today. She has not fallen asleep while driving. Kelly Vargas has been taking a nap during lunch time at times.   She has been known to snore for Kelly past many years. Snoring is reportedly marked, and associated with choking  sounds and witnessed apneas. Kelly Vargas admits to a sense of choking or strangling feeling. There is report of nighttime reflux, and she has to sleep on 2 pillows. She prefers to sleep on Kelly R side. There is no nighttime cough experienced. Kelly Vargas has not noted any RLS symptoms and is not known to kick while asleep or before falling asleep. There is family history of insomnia, but no obvious RLS or OSA.   She is a restless sleeper and in Kelly morning, Kelly bed is quite disheveled.    She denies cataplexy, sleep paralysis, hypnagogic or hypnopompic hallucinations, or sleep attacks. She does report any vivid dreams, some nightmares, no dream enactments, but parasomnias, such as sleep eating, and sleep walking. Kelly Vargas has not had a sleep study or a home sleep test.   She consumes 5-6 caffeinated beverages per day, usually in Kelly form of coffee and sodas, as late as dinner. Her bedroom is usually dark and cool.  There is no TV in Kelly bedroom.    Her Past Medical History Is Significant For: Past Medical History  Diagnosis Date  . Hypertension   . Rosacea   . Polycystic ovarian disease   . Morbid obesity   . Hernia   . GERD (gastroesophageal reflux disease)   . Migraine   . Depression   . Anxiety     Her Past Surgical History Is Significant For: Past Surgical History  Procedure Laterality Date  . Hernia repair    . Tonsilectomy, adenoidectomy, bilateral myringotomy and tubes    . Adenoidectomy    . Back surgery    . Ventrical hernia repair    . Umbilical hernia repair      Her Family History Is Significant For: Family History  Problem Relation Age of Onset  . Heart disease Mother   . Cancer Mother   . Stroke Mother   . Diabetes Mother   . Hypertension Mother   . Heart disease Father   . Hypertension Brother   . High Cholesterol Brother   . Depression Brother     Her Social History Is Significant For: History   Social History  . Marital Status: Legally Separated     Spouse Name: [name]  . Number of Children: 0  . Years of Education: Asocc.   Occupational History  .  [employer]   Social History Main Topics  . Smoking status: Former Smoker -- 1.00 packs/day for 20 years    Types: Cigarettes    Quit date: 09/02/2006  . Smokeless tobacco: Never Used  . Alcohol Use: 0.0 oz/week    0 Standard drinks or equivalent per week     Comment: occasionally  . Drug Use: No  . Sexual Activity:    Partners: Female   Other Topics Concern  . None   Social History Narrative   Vargas lives at home with spouse.   Caffeine Use: 5-6 cups of coffee daily    Her Allergies Are:  Allergies  Allergen Reactions  . Contrast Media [Iodinated Diagnostic Agents]     Renal failure   . Erythromycin   . Penicillins   :   Her Current Medications Are:  Outpatient Encounter Prescriptions as of 02/20/2015  Medication Sig  . Albuterol (VENTOLIN IN) Inhale 2 Inhalers into Kelly lungs 3 (three) times daily as needed.    . ALPRAZolam (XANAX) 0.5 MG tablet Take 0.5 mg by mouth 2 (two) times daily as needed.    Marland Kitchen aspirin 81 MG chewable tablet Chew 81 mg by mouth daily.  . Cholecalciferol (VITAMIN D PO) Take 5,000 mg by mouth daily.  Mariane Baumgarten Sodium (COLACE PO) Take 1 tablet by mouth daily.  Marland Kitchen escitalopram (LEXAPRO) 20 MG tablet Take 20 mg by mouth daily.    . famotidine (PEPCID) 20 MG tablet Take 20 mg by mouth 2 (two) times daily.  . fexofenadine (ALLEGRA) 180 MG tablet Take 180 mg by mouth daily.  Marland Kitchen FLUVIRIN SUSP   . hydrochlorothiazide (HYDRODIURIL) 12.5 MG tablet Take 12.5 mg by mouth daily.    . metFORMIN (GLUMETZA) 500 MG (MOD) 24 hr tablet Take 1 tablet (500 mg total) by mouth 2 (two) times daily with a meal.  . Prenatal Vit-Fe Fumarate-FA (M-VIT PO) Take by mouth.    . triamcinolone cream (KENALOG) 0.1 % As needed   No facility-administered encounter medications on file as of 02/20/2015.  :  Review of Systems:  Out of a complete 14 point review of  systems,  all are reviewed and negative with Kelly exception of these symptoms as listed below:   Review of Systems  All other systems reviewed and are negative.   Objective:  Neurologic Exam  Physical Exam Physical Examination:   Filed Vitals:   02/20/15 1144  BP: 142/90  Pulse: 72  Resp: 20   General Examination: Kelly Vargas is a very pleasant 44 y.o. female in no acute distress. She appears well-developed and well-nourished and well groomed. She is morbidly obese.   HEENT: Normocephalic, atraumatic, pupils are equal, round and reactive to light and accommodation. Funduscopic exam is normal with sharp disc margins noted. Extraocular tracking is good without limitation to gaze excursion or nystagmus noted. Normal smooth pursuit is noted. Hearing is grossly intact. Face is symmetric with normal facial animation and normal facial sensation. Tympanic membranes are clear bilaterally. Speech is clear with no dysarthria noted. There is no hypophonia. There is no lip, neck/head, jaw or voice tremor. Neck is supple with full range of passive and active motion. There are no carotid bruits on auscultation. Oropharynx exam reveals: mild mouth dryness, adequate dental hygiene and mild to moderate airway crowding, due to small airway opening and swollen uvula. Mallampati is class II. She has a significant amount of erythema in her pharynx and even hard palate. Tongue protrudes centrally and palate elevates symmetrically. Tonsils are absent. She has a Mild overbite.   Chest: Clear to auscultation without wheezing, rhonchi or crackles noted.  Heart: S1+S2+0, regular and normal without murmurs, rubs or gallops noted.   Abdomen: Soft, non-tender and non-distended with normal bowel sounds appreciated on auscultation.  Extremities: There is trace edema in Kelly distal lower extremities bilaterally. Pedal pulses are intact.  Skin: Warm and dry without trophic changes noted. There are no varicose  veins.  Musculoskeletal: exam reveals no obvious joint deformities, tenderness or joint swelling or erythema.   Neurologically:  Mental status: Kelly Vargas is awake, alert and oriented in all 4 spheres. Her immediate and remote memory, attention, language skills and fund of knowledge are appropriate. There is no evidence of aphasia, agnosia, apraxia or anomia. Speech is clear with normal prosody and enunciation. Thought process is linear. Mood is normal and affect is normal.  Cranial nerves II - XII are as described above under HEENT exam. In addition: shoulder shrug is normal with equal shoulder height noted. Motor exam: Normal bulk, strength and tone is noted. There is no drift, tremor or rebound. Romberg is negative. Reflexes are 1+ throughout. Babinski: Toes are flexor bilaterally. Fine motor skills and coordination: intact with normal finger taps, normal hand movements, normal rapid alternating patting, normal foot taps and normal foot agility.  Cerebellar testing: No dysmetria or intention tremor on finger to nose testing. Heel to shin is very difficult for her due to body habitus. There is no truncal or gait ataxia.  Sensory exam: intact to light touch in Kelly upper and lower extremities.  Gait, station and balance: She stands easily. No veering to one side is noted. No leaning to one side is noted. Posture is age-appropriate and stance is narrow based. Gait shows normal stride length and normal pace. No problems turning are noted. She turns en bloc. Tandem walk is slightly difficult for her.   Assessment and Plan:   In summary, MEIKO IVES is a very pleasant 44 year old female with an underlying medical history of polycystic ovarian syndrome, IBS, hypertension, reflux disease,  morbid obesity, depression and anxiety, status post ventral  hernia and umbilical hernia repairs, lumbar laminectomy, tonsillectomy and adenoidectomy, who presents for follow up consultation of her severe OSA as  demonstrated with a recent split night sleep study. She had evidence of severe central sleep apnea and CPAP did not help her sleep disordered breathing with residual obstructive sleep apnea noted while on CPAP and evidence of significant central apneas. She was placed on BiPAP therapy, then on BiPAP ST which helped eliminate her central apneas completely. She still has a rather high leak from Kelly mask. She is now on a smaller sized fullface mask. Leak has improved with pressure reduction and mask adjustment. She has been trying to lose weight. She does feel improved with her sleep after treatment was started. She is fully compliant with treatment with BiPAP therapy at this time and is commended for this. Today, I suggested we further reduce her pressure to 17/13 cm with Kelly same rate of 12/m. She is reminded to continue to strive for weight loss. Her physical exam is stable. I went over her most recent compliance data with her. I explained Kelly importance of being compliant with PAP treatment, not only for insurance purposes but primarily to improve Her symptoms, and for Kelly Vargas's long term health benefit, including to reduce Her cardiovascular risks.  I would like to see her back in 6 months, sooner if needed. I answered all her questions today and Kelly Vargas was in agreement. I encouraged her to call with any interim questions, concerns, problems or updates.  I spent 15 minutes in total face-to-face time with Kelly Vargas, more than 50% of which was spent in counseling and coordination of care, reviewing test results, reviewing medication and discussing or reviewing Kelly diagnosis of OSA, its prognosis and treatment options.

## 2015-02-20 NOTE — Patient Instructions (Signed)
We will further decrease your pressure to 17/13 cm and see if we get by with a lower pressure and your airleak improve. Continue working on weight loss!

## 2015-02-24 ENCOUNTER — Ambulatory Visit: Payer: 59 | Admitting: Certified Nurse Midwife

## 2015-03-02 ENCOUNTER — Encounter: Payer: Self-pay | Admitting: Certified Nurse Midwife

## 2015-03-02 ENCOUNTER — Ambulatory Visit (INDEPENDENT_AMBULATORY_CARE_PROVIDER_SITE_OTHER): Payer: 59 | Admitting: Certified Nurse Midwife

## 2015-03-02 VITALS — BP 133/89 | HR 69 | Temp 97.2°F | Ht 71.0 in | Wt >= 6400 oz

## 2015-03-02 DIAGNOSIS — L680 Hirsutism: Secondary | ICD-10-CM

## 2015-03-02 DIAGNOSIS — Z01419 Encounter for gynecological examination (general) (routine) without abnormal findings: Secondary | ICD-10-CM

## 2015-03-02 DIAGNOSIS — E1169 Type 2 diabetes mellitus with other specified complication: Secondary | ICD-10-CM | POA: Diagnosis not present

## 2015-03-02 DIAGNOSIS — E1159 Type 2 diabetes mellitus with other circulatory complications: Secondary | ICD-10-CM

## 2015-03-02 DIAGNOSIS — E282 Polycystic ovarian syndrome: Secondary | ICD-10-CM

## 2015-03-02 DIAGNOSIS — N912 Amenorrhea, unspecified: Secondary | ICD-10-CM

## 2015-03-02 DIAGNOSIS — R1030 Lower abdominal pain, unspecified: Secondary | ICD-10-CM | POA: Diagnosis not present

## 2015-03-02 DIAGNOSIS — I10 Essential (primary) hypertension: Secondary | ICD-10-CM | POA: Diagnosis not present

## 2015-03-02 DIAGNOSIS — B3731 Acute candidiasis of vulva and vagina: Secondary | ICD-10-CM

## 2015-03-02 DIAGNOSIS — B373 Candidiasis of vulva and vagina: Secondary | ICD-10-CM

## 2015-03-02 DIAGNOSIS — I152 Hypertension secondary to endocrine disorders: Secondary | ICD-10-CM | POA: Insufficient documentation

## 2015-03-02 DIAGNOSIS — Z6841 Body Mass Index (BMI) 40.0 and over, adult: Secondary | ICD-10-CM

## 2015-03-02 MED ORDER — NORGESTIM-ETH ESTRAD TRIPHASIC 0.18/0.215/0.25 MG-35 MCG PO TABS: 1.0000 | ORAL_TABLET | Freq: Every day | ORAL | Status: DC

## 2015-03-02 MED ORDER — TERCONAZOLE 0.4 % VA CREA: 1.0000 | TOPICAL_CREAM | Freq: Every day | VAGINAL | Status: DC

## 2015-03-02 MED ORDER — PROGESTERONE MICRONIZED 200 MG PO CAPS: ORAL_CAPSULE | ORAL | Status: DC

## 2015-03-02 MED ORDER — SPIRONOLACTONE 50 MG PO TABS: 50.0000 mg | ORAL_TABLET | Freq: Every day | ORAL | Status: DC

## 2015-03-02 MED ORDER — CLOTRIMAZOLE 1 % EX CREA
1.0000 | TOPICAL_CREAM | Freq: Two times a day (BID) | CUTANEOUS | Status: DC
Start: 2015-03-02 — End: 2015-08-22

## 2015-03-02 MED ORDER — EFLORNITHINE HCL 13.9 % EX CREA: 1.0000 "application " | TOPICAL_CREAM | Freq: Two times a day (BID) | CUTANEOUS | Status: DC

## 2015-03-02 MED ORDER — FLUCONAZOLE 100 MG PO TABS: 100.0000 mg | ORAL_TABLET | Freq: Once | ORAL | Status: DC

## 2015-03-02 MED ORDER — NYSTATIN 100000 UNIT/GM EX POWD: 1.0000 g | Freq: Three times a day (TID) | CUTANEOUS | Status: DC

## 2015-03-02 NOTE — Addendum Note (Signed)
Addended by: Marya LandryFOSTER, Darrin Koman D on: 03/02/2015 05:10 PM   Modules accepted: Orders

## 2015-03-02 NOTE — Progress Notes (Signed)
Patient ID: Kelly Vargas, female   DOB: 01/18/1971, 44 y.o.   MRN: [id number]    Subjective:     Kelly Vargas is a 44 y.o. female here for a routine exam.  Hx of PCOS.  Not having regular periods.  Not having periods, amenorrhea since last July 2015.  Has been on metformin.  Current complaints: amenorrhea.  Lesbian.      Personal health questionnaire:  Is patient Ashkenazi Jewish, have a family history of breast and/or ovarian cancer: no Is there a family history of uterine cancer diagnosed at age < 2950, gastrointestinal cancer, urinary tract cancer, family member who is a Personnel officerLynch syndrome-associated carrier: no Is the patient overweight and hypertensive, family history of diabetes, personal history of gestational diabetes, preeclampsia or PCOS: yes Is patient over 5455, have PCOS,  family history of premature CHD under age 42, diabetes, smoke, have hypertension or peripheral artery disease:  yes At any time, has a partner hit, kicked or otherwise hurt or frightened you?: no Over the past 2 weeks, have you felt down, depressed or hopeless?: no Over the past 2 weeks, have you felt little interest or pleasure in doing things?:Dr. Lafayette DragonCarr sees her for depression and anxiety.     Gynecologic History No LMP recorded. Contraception: none Last Pap: unknown. Results were: normal according to the patient Last mammogram: none. Has not had a mammogram.    Obstetric History OB History  No data available    Past Medical History  Diagnosis Date  . Hypertension   . Rosacea   . Polycystic ovarian disease   . Morbid obesity   . Hernia   . GERD (gastroesophageal reflux disease)   . Migraine   . Depression   . Anxiety   . Sleep apnea     Past Surgical History  Procedure Laterality Date  . Hernia repair    . Tonsilectomy, adenoidectomy, bilateral myringotomy and tubes    . Adenoidectomy    . Back surgery    . Ventrical hernia repair    . Umbilical hernia repair       Current outpatient  prescriptions:  .  Albuterol (VENTOLIN IN), Inhale 2 Inhalers into the lungs 3 (three) times daily as needed.  , Disp: , Rfl:  .  ALPRAZolam (XANAX) 0.5 MG tablet, Take 0.5 mg by mouth 2 (two) times daily as needed.  , Disp: , Rfl:  .  aspirin 81 MG chewable tablet, Chew 81 mg by mouth daily., Disp: , Rfl:  .  Cholecalciferol (VITAMIN D PO), Take 5,000 mg by mouth daily., Disp: , Rfl:  .  Docusate Sodium (COLACE PO), Take 1 tablet by mouth daily., Disp: , Rfl:  .  escitalopram (LEXAPRO) 20 MG tablet, Take 20 mg by mouth daily.  , Disp: , Rfl:  .  famotidine (PEPCID) 20 MG tablet, Take 20 mg by mouth 2 (two) times daily., Disp: , Rfl:  .  fexofenadine (ALLEGRA) 180 MG tablet, Take 180 mg by mouth daily., Disp: , Rfl:  .  hydrochlorothiazide (HYDRODIURIL) 12.5 MG tablet, Take 25 mg by mouth daily. , Disp: , Rfl:  .  metFORMIN (GLUMETZA) 500 MG (MOD) 24 hr tablet, Take 1 tablet (500 mg total) by mouth 2 (two) times daily with a meal., Disp: 60 tablet, Rfl: 11 .  Prenatal Vit-Fe Fumarate-FA (M-VIT PO), Take by mouth.  , Disp: , Rfl:  .  triamcinolone cream (KENALOG) 0.1 %, As needed, Disp: , Rfl:  .  clotrimazole (LOTRIMIN AF)  1 % cream, Apply 1 application topically 2 (two) times daily., Disp: 30 g, Rfl: 0 .  Eflornithine HCl 13.9 % cream, Apply 1 application topically 2 (two) times daily with a meal., Disp: 45 g, Rfl: 4 .  fluconazole (DIFLUCAN) 100 MG tablet, Take 1 tablet (100 mg total) by mouth once. Repeat dose in 48-72 hour., Disp: 3 tablet, Rfl: 0 .  FLUVIRIN SUSP, , Disp: , Rfl: 0 .  Norgestimate-Ethinyl Estradiol Triphasic 0.18/0.215/0.25 MG-35 MCG tablet, Take 1 tablet by mouth daily., Disp: 1 Package, Rfl: 11 .  nystatin (MYCOSTATIN/NYSTOP) 100000 UNIT/GM POWD, Apply 1 g topically 3 (three) times daily., Disp: 60 g, Rfl: 0 .  progesterone (PROMETRIUM) 200 MG capsule, Take 2 tablets at bedtime for 10 days., Disp: 20 capsule, Rfl: 0 .  spironolactone (ALDACTONE) 50 MG tablet, Take 1 tablet  (50 mg total) by mouth daily. Take with breakfast., Disp: 30 tablet, Rfl: 12 .  terconazole (TERAZOL 7) 0.4 % vaginal cream, Place 1 applicator vaginally at bedtime., Disp: 45 g, Rfl: 0 Allergies  Allergen Reactions  . Chamomile Swelling  . Contrast Media [Iodinated Diagnostic Agents]     Renal failure   . Erythromycin   . Penicillins     History  Substance Use Topics  . Smoking status: Former Smoker -- 1.00 packs/day for 20 years    Types: Cigarettes    Quit date: 09/02/2006  . Smokeless tobacco: Never Used  . Alcohol Use: 0.0 oz/week    0 Standard drinks or equivalent per week     Comment: occasionally    Family History  Problem Relation Age of Onset  . Heart disease Mother   . Cancer Mother   . Stroke Mother   . Diabetes Mother   . Hypertension Mother   . Heart disease Father   . Hypertension Brother   . High Cholesterol Brother   . Depression Brother       Review of Systems  Constitutional: negative for fatigue and weight loss Respiratory: negative for cough and wheezing Cardiovascular: negative for chest pain, fatigue and palpitations Gastrointestinal: negative for abdominal pain and change in bowel habits Musculoskeletal:negative for myalgias Neurological: negative for gait problems and tremors Behavioral/Psych: negative for abusive relationship, depression Endocrine: negative for temperature intolerance   Genitourinary:negative for abnormal menstrual periods, genital lesions, hot flashes, and sexual problems. + vaginal discharge, amenorrhea X1 year.  Integument/breast: negative for breast lump, breast tenderness, nipple discharge and skin lesion(s)    Objective:       BP 133/89 mmHg  Pulse 69  Temp(Src) 97.2 F (36.2 C)  Ht 5\' 11"  (1.803 m)  Wt 414 lb (187.789 kg)  BMI 57.77 kg/m2 General:   alert  Skin:   no rash or abnormalities  Lungs:   clear to auscultation bilaterally  Heart:   regular rate and rhythm, S1, S2 normal, no murmur, click, rub or  gallop  Breasts:   normal without suspicious masses, skin or nipple changes or axillary nodes  Abdomen:  normal findings: no organomegaly, soft, non-tender and no hernia Morbid obesity  Pelvis:  External genitalia: normal general appearance Urinary system: urethral meatus normal and bladder without fullness, nontender Vaginal: normal without tenderness, induration or masses Cervix: normal appearance Adnexa: normal bimanual exam, difficult to assess d/t excess adipose tissue Uterus: retroverted and non-tender, normal size   Lab Review Urine pregnancy test Labs reviewed yes Radiologic studies reviewed no  50% of 30 min visit spent on counseling and coordination of care.  Assessment:    Healthy female exam.   VVC Morbid obesity Bilateral Groin discomfort PCOS Hirsutism DM2 Amenorrhea    Plan:    Education reviewed: depression evaluation, low fat, low cholesterol diet, safe sex/STD prevention, self breast exams, skin cancer screening and weight bearing exercise. Contraception: OCP (estrogen/progesterone). Mammogram ordered. Follow up in: 6 months.   Meds ordered this encounter  Medications  . nystatin (MYCOSTATIN/NYSTOP) 100000 UNIT/GM POWD    Sig: Apply 1 g topically 3 (three) times daily.    Dispense:  60 g    Refill:  0  . clotrimazole (LOTRIMIN AF) 1 % cream    Sig: Apply 1 application topically 2 (two) times daily.    Dispense:  30 g    Refill:  0  . spironolactone (ALDACTONE) 50 MG tablet    Sig: Take 1 tablet (50 mg total) by mouth daily. Take with breakfast.    Dispense:  30 tablet    Refill:  12  . Eflornithine HCl 13.9 % cream    Sig: Apply 1 application topically 2 (two) times daily with a meal.    Dispense:  45 g    Refill:  4  . progesterone (PROMETRIUM) 200 MG capsule    Sig: Take 2 tablets at bedtime for 10 days.    Dispense:  20 capsule    Refill:  0  . Norgestimate-Ethinyl Estradiol Triphasic 0.18/0.215/0.25 MG-35 MCG tablet    Sig: Take 1  tablet by mouth daily.    Dispense:  1 Package    Refill:  11  . fluconazole (DIFLUCAN) 100 MG tablet    Sig: Take 1 tablet (100 mg total) by mouth once. Repeat dose in 48-72 hour.    Dispense:  3 tablet    Refill:  0  . terconazole (TERAZOL 7) 0.4 % vaginal cream    Sig: Place 1 applicator vaginally at bedtime.    Dispense:  45 g    Refill:  0   Orders Placed This Encounter  Procedures  . MM DIGITAL SCREENING BILATERAL    Standing Status: Future     Number of Occurrences:      Standing Expiration Date: 05/01/2016    Order Specific Question:  Reason for Exam (SYMPTOM  OR DIAGNOSIS REQUIRED)    Answer:  well woman exam    Order Specific Question:  Is the patient pregnant?    Answer:  No    Order Specific Question:  Preferred imaging location?    Answer:  [facility]

## 2015-03-05 LAB — SURESWAB BACTERIAL VAGINOSIS/ITIS
Atopobium vaginae: NOT DETECTED Log (cells/mL)
C. PARAPSILOSIS, DNA: NOT DETECTED
C. albicans, DNA: NOT DETECTED
C. glabrata, DNA: NOT DETECTED
C. tropicalis, DNA: NOT DETECTED
Gardnerella vaginalis: NOT DETECTED Log (cells/mL)
LACTOBACILLUS SPECIES: 6.6 Log (cells/mL)
MEGASPHAERA SPECIES: NOT DETECTED Log (cells/mL)
T. vaginalis RNA, QL TMA: NOT DETECTED

## 2015-03-07 LAB — PAP IG AND HPV HIGH-RISK: HPV DNA High Risk: NOT DETECTED

## 2015-06-19 ENCOUNTER — Other Ambulatory Visit: Payer: Self-pay | Admitting: Certified Nurse Midwife

## 2015-08-15 ENCOUNTER — Telehealth: Payer: Self-pay

## 2015-08-15 NOTE — Telephone Encounter (Signed)
Left message on cell and home number to call us back and reschedule appt on 12/27. Dr. Frances FurbishAthar will be out of the office that day. Patient need 30min appt.

## 2015-08-22 ENCOUNTER — Encounter: Payer: Self-pay | Admitting: Neurology

## 2015-08-22 ENCOUNTER — Ambulatory Visit (INDEPENDENT_AMBULATORY_CARE_PROVIDER_SITE_OTHER): Payer: 59 | Admitting: Neurology

## 2015-08-22 VITALS — BP 142/90 | HR 78 | Resp 20 | Ht 71.0 in | Wt >= 6400 oz

## 2015-08-22 DIAGNOSIS — G4733 Obstructive sleep apnea (adult) (pediatric): Secondary | ICD-10-CM

## 2015-08-22 DIAGNOSIS — R351 Nocturia: Secondary | ICD-10-CM | POA: Diagnosis not present

## 2015-08-22 DIAGNOSIS — G4731 Primary central sleep apnea: Secondary | ICD-10-CM

## 2015-08-22 NOTE — Patient Instructions (Signed)
Please continue your BiPAP and work on weight loss.   We will do a one year check.

## 2015-08-22 NOTE — Progress Notes (Signed)
Subjective:    Patient ID: Kelly Vargas is a 44 y.o. female.  HPI     Interim history:   Kelly Vargas is a 44 year old right-handed woman with an underlying medical history of polycystic ovarian syndrome, IBS, hypertension, reflux disease, obesity, depression and anxiety, PTSD, status post ventral hernia and umbilical hernia repairs, lumbar laminectomy, tonsillectomy and adenoidectomy, who presents for follow-up consultation of her sleep disordered breathing, on BiPAP ST treatment. The patient is unaccompanied today. I last saw her on 02/20/2015, at which time she reported doing fairly well. She was trying to lose weight and had lost about 11 pounds. 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 She was enrolling in the masters program. She was seeking care with a new primary care physician. She was using a small fullface mask. She was compliant with BiPAP treatment. I encouraged her to continue with treatment and to strive for weight loss.  Today, 08/22/2015: I reviewed her BiPAP compliance data from 07/22/2015 through 08/20/2015 which is a total of 30 days during which time she used her machine every night with percent used days greater than 4 hours at 97%, indicating excellent compliance with an average usage of 8 hours and 29 minutes, residual AHI low at 0.1 per hour, leak consistently high with the 95th percentile of leak at 65.1 L/m on a pressure of 17/13 with a rate of 12.  Today, 08/22/2015: She reports having had more stress since she is working FT and in [employer] school. She had been started on spironolactone 50 mg once daily and OCP for her PCOS. She has lost a little bit of weight. She drinks water, coffee, some tea, and 24 oz of sodas per day. She has urinary urgency and nocturia, occasional urinary incontinence for which she has to wear depends and Tena.   Previously:   I saw her on 10/27/2014, at which time she reported doing fairly well. Unfortunately, leak from the mask  was still high. Her lower extremity edema was no better. Her blood pressure values were good. Her nocturia was much improved from before treatment. I requested a reduction in her pressure to 19/15 cm.  I reviewed her BiPAP compliance data from 01/18/2015 through 02/16/2015 which is a total of 30 days during which time she used her machine every night, percent used days greater than 4 hours was under percent, indicating superb compliance with an average usage of 8 hours and 34 minutes for all nights, residual AHI low at 0.2 per hour but leak still high, albeit improved with the 95th percentile at 63.2 L/m. Pressure at 19/15 with a rate of 12.  I saw her on 07/26/2014, at which time we talked about her sleep test results and her compliance data. She had obstructive sleep apnea which did not respond completely to CPAP therapy and standard BiPAP therapy and she started having central apneas on standard BiPAP. She was therefore switched to BiPAP ST. She had good compliance and indicated good results. She reported sleeping better and improvement of her nocturia as well as improved daytime alertness. Leak was high and I suggested we request a mask refit and reduction in BiPAP pressure 19/15 cm but both things were actually not gone. I have emailed her DME provider today. The patient states that she was mailed another fullface mask but this was even worse in terms of fit and her mouth would come open. She is trying to get on track with her weight loss.   I  reviewed her BiPAP ST compliance data from 09/24/2014 through 10/23/2014 which is a total of 30 days during which time she was under percent compliant. Average usage of 7 hours and 52 minutes with a pressure of 21/17 and the rate of 12. Residual AHI at 1 per hour and leak at times high with the 95th percentile at 75.6 L/m.  I first met her on 05/13/2014 at the request of her psychiatrist, at which time the patient reported difficulty with sleep maintenance with  frequent nighttime awakenings, snoring, nocturia, and witnessed apneas. I asked her to return for a sleep study. She had a split-night sleep study on 06/07/2014 and went over her test results with her in detail today. Her baseline sleep efficiency was 62.9% with a latency to sleep of 15.5 minutes and wake after sleep onset of 21 minutes with moderate sleep fragmentation noted. She had an increased percentage of stage I sleep, an increased percentage of slow-wave sleep, and absence of REM sleep. No EKG changes were noted and no significant PLMS. She had loud snoring. She slept on her back only. Her total AHI was 61 per hour. Baseline oxygen saturation was only 88%, nadir was 80%. She was therefore started on CPAP therapy. Sleep efficiency was improved. She had an increased percentage of REM sleep at 39.3%. Average oxygen saturation was 89%, nadir was 78%. The patient was started on CPAP. She was first tried on a nasal mask but had significant mouth opening. She was titrated from 5-15 cm. She had significant residual sleep disordered breathing and therefore was switched to BiPAP therapy. She was titrated from 16/12 cm to 20/16 cm. She had central apneas. She was switched to BiPAP ST. She was titrated to a final pressure of 21/17 with a rate of 12. Residual AHI was 0 on the final setting with her O2 nadir of 89% on the final setting. Based on her test results I tarted her on BiPAP ST at home.  I reviewed her BiPAP compliance data from 06/21/2014 through 07/20/2014 which is a total of 30 days during which time she was under percent compliant. Percent used days greater than 4 hours was 100%, indicating superb compliance. Average usage of 8 hours and 17 minutes. Pressure at 21/17 with a rate of 12. Residual AHI 1 per hour, leak was high.  She works from home for [employer]. She has had sleep walking and sleep eating into adulthood. She has tried Nyquil, Xanax, benadryl. She used to get morning HAs, but these improved since  she starting using a custom made bite guard for bruxism. She has had nocturia x 2-3 times, but also nocturnal incontinence. She denies RLS symptoms and is not known to kick in her sleep.   Her typical bedtime is reported to be around MN and usual wake time is around 7:30 AM. Sleep onset typically occurs within minutes after reading on her Kindle. She reports feeling poorly rested upon awakening. She wakes up on an average 3 times in the middle of the night and has to go to the bathroom 2-3 times on a typical night.   She reports excessive daytime somnolence (EDS) and Her Epworth Sleepiness Score (ESS) is 6/24 today. She has not fallen asleep while driving. The patient has been taking a nap during lunch time at times.   She has been known to snore for the past many years. Snoring is reportedly marked, and associated with choking sounds and witnessed apneas. The patient admits to a sense of choking or  strangling feeling. There is report of nighttime reflux, and she has to sleep on 2 pillows. She prefers to sleep on the R side. There is no nighttime cough experienced. The patient has not noted any RLS symptoms and is not known to kick while asleep or before falling asleep. There is family history of insomnia, but no obvious RLS or OSA.   She is a restless sleeper and in the morning, the bed is quite disheveled.    She denies cataplexy, sleep paralysis, hypnagogic or hypnopompic hallucinations, or sleep attacks. She does report any vivid dreams, some nightmares, no dream enactments, but parasomnias, such as sleep eating, and sleep walking. The patient has not had a sleep study or a home sleep test.   She consumes 5-6 caffeinated beverages per day, usually in the form of coffee and sodas, as late as dinner. Her bedroom is usually dark and cool. There is no TV in the bedroom.    Her Past Medical History Is Significant For: Past Medical History  Diagnosis Date  . Hypertension   . Rosacea   . Polycystic ovarian  disease   . Morbid obesity ([place])   . Hernia   . GERD (gastroesophageal reflux disease)   . Migraine   . Depression   . Anxiety   . Sleep apnea     Her Past Surgical History Is Significant For: Past Surgical History  Procedure Laterality Date  . Hernia repair    . Tonsilectomy, adenoidectomy, bilateral myringotomy and tubes    . Adenoidectomy    . Back surgery    . Ventrical hernia repair    . Umbilical hernia repair      Her Family History Is Significant For: Family History  Problem Relation Age of Onset  . Heart disease Mother   . Cancer Mother   . Stroke Mother   . Diabetes Mother   . Hypertension Mother   . Heart disease Father   . Hypertension Brother   . High Cholesterol Brother   . Depression Brother     Her Social History Is Significant For: Social History   Social History  . Marital Status: Legally Separated    Spouse Name: [name]  . Number of Children: 0  . Years of Education: Asocc.   Occupational History  .  [employer]   Social History Main Topics  . Smoking status: Former Smoker -- 1.00 packs/day for 20 years    Types: Cigarettes    Quit date: 09/02/2006  . Smokeless tobacco: Never Used  . Alcohol Use: 0.0 oz/week    0 Standard drinks or equivalent per week     Comment: occasionally  . Drug Use: No  . Sexual Activity:    Partners: Female   Other Topics Concern  . None   Social History Narrative   Patient lives at home with spouse.   Caffeine Use: 5-6 cups of coffee daily    Her Allergies Are:  Allergies  Allergen Reactions  . Chamomile Swelling  . Contrast Media [Iodinated Diagnostic Agents]     Renal failure   . Erythromycin   . Penicillins   :   Her Current Medications Are:  Outpatient Encounter Prescriptions as of 08/22/2015  Medication Sig  . Albuterol (VENTOLIN IN) Inhale 2 Inhalers into the lungs 3 (three) times daily as needed.    . ALPRAZolam (XANAX) 0.5 MG tablet Take 0.5 mg by mouth 2 (two) times daily as  needed.    Marland Kitchen aspirin 81 MG chewable tablet  Chew 81 mg by mouth daily.  . Cholecalciferol (VITAMIN D PO) Take 5,000 mg by mouth daily.  Kelly Vargas Sodium (COLACE PO) Take 1 tablet by mouth daily.  Marland Kitchen escitalopram (LEXAPRO) 20 MG tablet Take 20 mg by mouth daily.    . famotidine (PEPCID) 20 MG tablet Take 20 mg by mouth 2 (two) times daily.  . fexofenadine (ALLEGRA) 180 MG tablet Take 180 mg by mouth daily.  Marland Kitchen FLUVIRIN SUSP   . hydrochlorothiazide (HYDRODIURIL) 12.5 MG tablet Take 25 mg by mouth daily.   . metFORMIN (GLUMETZA) 500 MG (MOD) 24 hr tablet Take 1 tablet (500 mg total) by mouth 2 (two) times daily with a meal.  . Norgestimate-Ethinyl Estradiol Triphasic 0.18/0.215/0.25 MG-35 MCG tablet Take 1 tablet by mouth daily.  . Prenatal Vit-Fe Fumarate-FA (M-VIT PO) Take by mouth.    . spironolactone (ALDACTONE) 50 MG tablet Take 1 tablet (50 mg total) by mouth daily. Take with breakfast.  . [DISCONTINUED] clotrimazole (LOTRIMIN AF) 1 % cream Apply 1 application topically 2 (two) times daily.  . [DISCONTINUED] Eflornithine HCl 13.9 % cream Apply 1 application topically 2 (two) times daily with a meal.  . [DISCONTINUED] fluconazole (DIFLUCAN) 100 MG tablet Take 1 tablet (100 mg total) by mouth once. Repeat dose in 48-72 hour.  . [DISCONTINUED] nystatin (MYCOSTATIN/NYSTOP) 100000 UNIT/GM POWD Apply 1 g topically 3 (three) times daily.  . [DISCONTINUED] progesterone (PROMETRIUM) 200 MG capsule Take 2 tablets at bedtime for 10 days.  . [DISCONTINUED] terconazole (TERAZOL 7) 0.4 % vaginal cream Place 1 applicator vaginally at bedtime.  . [DISCONTINUED] triamcinolone cream (KENALOG) 0.1 % As needed   No facility-administered encounter medications on file as of 08/22/2015.  :  Review of Systems:  Out of a complete 14 point review of systems, all are reviewed and negative with the exception of these symptoms as listed below:   Review of Systems  Neurological:       Patient tried trazodone for  sleep but did not like the way it made her feel, she is no longer taking it.  Patient feels that the pressure needs to be increased on her BiPAP machine. She is starting to have nightmares and incontinence at night.     Objective:  Neurologic Exam  Physical Exam Physical Examination:   Filed Vitals:   08/22/15 1037  BP: 142/90  Pulse: 78  Resp: 20    General Examination: The patient is a very pleasant 44 y.o. female in no acute distress. She appears well-developed and well-nourished and well groomed. She is morbidly obese.   HEENT: Normocephalic, atraumatic, pupils are equal, round and reactive to light and accommodation. Funduscopic exam is normal with sharp disc margins noted. Extraocular tracking is good without limitation to gaze excursion or nystagmus noted. Normal smooth pursuit is noted. Hearing is grossly intact. Face is symmetric with normal facial animation and normal facial sensation. Tympanic membranes are clear bilaterally. Speech is clear with no dysarthria noted. There is no hypophonia. There is no lip, neck/head, jaw or voice tremor. Neck is supple with full range of passive and active motion. There are no carotid bruits on auscultation. Oropharynx exam reveals: mild mouth dryness, adequate dental hygiene and mild to moderate airway crowding, due to small airway opening and swollen uvula. Mallampati is class II. She has a significant amount of erythema in her pharynx and even hard palate. Tongue protrudes centrally and palate elevates symmetrically. Tonsils are absent. She has a Mild overbite.   Chest: Clear to  auscultation without wheezing, rhonchi or crackles noted.  Heart: S1+S2+0, regular and normal without murmurs, rubs or gallops noted.   Abdomen: Soft, non-tender and non-distended with normal bowel sounds appreciated on auscultation.  Extremities: There is trace edema in the distal lower extremities bilaterally. Pedal pulses are intact.  Skin: Warm and dry without  trophic changes noted. There are no varicose veins.  Musculoskeletal: exam reveals no obvious joint deformities, tenderness or joint swelling or erythema.   Neurologically:  Mental status: The patient is awake, alert and oriented in all 4 spheres. Her immediate and remote memory, attention, language skills and fund of knowledge are appropriate. There is no evidence of aphasia, agnosia, apraxia or anomia. Speech is clear with normal prosody and enunciation. Thought process is linear. Mood is normal and affect is normal.  Cranial nerves II - XII are as described above under HEENT exam. In addition: shoulder shrug is normal with equal shoulder height noted. Motor exam: Normal bulk, strength and tone is noted. There is no drift, tremor or rebound. Romberg is negative. Reflexes are 1+ throughout. Babinski: Toes are flexor bilaterally. Fine motor skills and coordination: intact with normal finger taps, normal hand movements, normal rapid alternating patting, normal foot taps and normal foot agility.  Cerebellar testing: No dysmetria or intention tremor on finger to nose testing. Heel to shin is very difficult for her due to body habitus. There is no truncal or gait ataxia.  Sensory exam: intact to light touch in the upper and lower extremities.  Gait, station and balance: She stands easily. No veering to one side is noted. No leaning to one side is noted. Posture is age-appropriate and stance is narrow based. Gait shows normal stride length and normal pace. No problems turning are noted. She turns en bloc. Tandem walk is slightly difficult for her.   Assessment and Plan:   In summary, ELIANNE GUBSER is a very pleasant 44 year old female with an underlying medical history of polycystic ovarian syndrome, IBS, hypertension, reflux disease, morbid obesity, depression and anxiety, status post ventral hernia and umbilical hernia repairs, lumbar laminectomy, tonsillectomy and adenoidectomy, who presents for follow  up consultation of her severe OSA as demonstrated with a split night sleep study on 06/07/2014. She had evidence of severe central sleep apnea and CPAP did not help her sleep disordered breathing with residual obstructive sleep apnea noted while on CPAP and evidence of significant central apneas. She was placed on BiPAP therapy, then on BiPAP ST. She has had ongoing issues with large leak from the mask. She has had a mask refit. She has been fully compliant with treatment. Her residual AHI is close to 0 per hour at this time. She is advised that she is well treated with positive airway pressure at this time. She complains of recurrence of some of her nocturia and even nocturnal urinary incontinence. In the past, she had seen urology. She does report that both her mother and her maternal grandmother had problems with bladder prolapse. She is advised to reconsider a follow-up with urology. She is furthermore advised to watch whether her urinary complaints could be secondary to what she drinks and how much caffeine she takes in. She has been started on a second diuretic in the form of spironolactone about 6 months ago as well. From my end of things, her exam is stable. She is encouraged to try to continue to pursue weight loss aggressively. She has lost about 4 pounds in the last 6 months. She has  received supplies regularly. At this juncture, I suggested a once yearly checkup with me. I answered all her questions today and she was in agreement. I spent 20 minutes in total face-to-face time with the patient, more than 50% of which was spent in counseling and coordination of care, reviewing test results, reviewing medication and discussing or reviewing the diagnosis of OSA, its prognosis and treatment options.

## 2015-08-29 ENCOUNTER — Ambulatory Visit: Payer: 59 | Admitting: Neurology

## 2015-09-01 ENCOUNTER — Ambulatory Visit: Payer: 59 | Admitting: Certified Nurse Midwife

## 2015-09-08 ENCOUNTER — Ambulatory Visit: Payer: 59 | Admitting: Certified Nurse Midwife

## 2015-09-15 ENCOUNTER — Ambulatory Visit (INDEPENDENT_AMBULATORY_CARE_PROVIDER_SITE_OTHER): Payer: 59 | Admitting: Certified Nurse Midwife

## 2015-09-15 VITALS — BP 147/91 | HR 80 | Wt >= 6400 oz

## 2015-09-15 DIAGNOSIS — E282 Polycystic ovarian syndrome: Secondary | ICD-10-CM

## 2015-09-16 ENCOUNTER — Encounter: Payer: Self-pay | Admitting: Certified Nurse Midwife

## 2015-09-16 NOTE — Progress Notes (Signed)
Patient ID: Kelly Vargas, female   DOB: [birth year], 45 y.o.   MRN: [id number]  Chief Complaint  Patient presents with  . Follow-up    HPI Kelly Vargas is a 45 y.o. female.  Here for f/u on PCOS.  Was having amenorrhea, hirsutism, weight gain.  OCPs started.  Reports having regular periods with OCPs, the first period she reports as being heavy.  In December she had a light period.  Desires to continue OCPs, metformin and spironolactone.   Here for exam with her partner.   HPI  Past Medical History  Diagnosis Date  . Hypertension   . Rosacea   . Polycystic ovarian disease   . Morbid obesity ([facility])   . Hernia   . GERD (gastroesophageal reflux disease)   . Migraine   . Depression   . Anxiety   . Sleep apnea     Past Surgical History  Procedure Laterality Date  . Hernia repair    . Tonsilectomy, adenoidectomy, bilateral myringotomy and tubes    . Adenoidectomy    . Back surgery    . Ventrical hernia repair    . Umbilical hernia repair      Family History  Problem Relation Age of Onset  . Heart disease Mother   . Cancer Mother   . Stroke Mother   . Diabetes Mother   . Hypertension Mother   . Heart disease Father   . Hypertension Brother   . High Cholesterol Brother   . Depression Brother     Social History Social History  Substance Use Topics  . Smoking status: Former Smoker -- 1.00 packs/day for 20 years    Types: Cigarettes    Quit date: 09/02/2006  . Smokeless tobacco: Never Used  . Alcohol Use: 0.0 oz/week    0 Standard drinks or equivalent per week     Comment: occasionally    Allergies  Allergen Reactions  . Chamomile Swelling  . Contrast Media [Iodinated Diagnostic Agents]     Renal failure   . Erythromycin   . Penicillins     Current Outpatient Prescriptions  Medication Sig Dispense Refill  . Albuterol (VENTOLIN IN) Inhale 2 Inhalers into the lungs 3 (three) times daily as needed.      . ALPRAZolam (XANAX) 0.5 MG tablet Take 0.5 mg by mouth  2 (two) times daily as needed.      Marland Kitchen aspirin 81 MG chewable tablet Chew 81 mg by mouth daily.    . Cholecalciferol (VITAMIN D PO) Take 5,000 mg by mouth daily.    Tery Sanfilippo Sodium (COLACE PO) Take 1 tablet by mouth daily.    Marland Kitchen escitalopram (LEXAPRO) 20 MG tablet Take 20 mg by mouth daily.      . famotidine (PEPCID) 20 MG tablet Take 20 mg by mouth 2 (two) times daily.    . fexofenadine (ALLEGRA) 180 MG tablet Take 180 mg by mouth daily.    Marland Kitchen FLUVIRIN SUSP   0  . hydrochlorothiazide (HYDRODIURIL) 12.5 MG tablet Take 25 mg by mouth daily.     . metFORMIN (GLUMETZA) 500 MG (MOD) 24 hr tablet Take 1 tablet (500 mg total) by mouth 2 (two) times daily with a meal. 60 tablet 11  . Norgestimate-Ethinyl Estradiol Triphasic 0.18/0.215/0.25 MG-35 MCG tablet Take 1 tablet by mouth daily. 1 Package 11  . Prenatal Vit-Fe Fumarate-FA (M-VIT PO) Take by mouth.      . spironolactone (ALDACTONE) 50 MG tablet Take 1 tablet (50 mg total)  by mouth daily. Take with breakfast. 30 tablet 12   No current facility-administered medications for this visit.    Review of Systems Review of Systems Constitutional: negative for fatigue and weight loss Respiratory: negative for cough and wheezing Cardiovascular: negative for chest pain, fatigue and palpitations Gastrointestinal: negative for abdominal pain and change in bowel habits Genitourinary:negative Integument/breast: negative for nipple discharge Musculoskeletal:negative for myalgias Neurological: negative for gait problems and tremors Behavioral/Psych: negative for abusive relationship, depression Endocrine: negative for temperature intolerance     Blood pressure 147/91, pulse 80, weight 412 lb (186.882 kg), last menstrual period 09/05/2015.  Physical Exam Physical Exam General:   alert  Skin:   no rash or abnormalities  Lungs:   clear to auscultation bilaterally  Heart:   regular rate and rhythm, S1, S2 normal, no murmur, click, rub or gallop    90%  of 15 min visit spent on counseling and coordination of care.   Data Reviewed Previous medical hx, meds, labs  Assessment     PCOS  Contraception management     Plan    Orders Placed This Encounter  Procedures  . MM DIGITAL SCREENING BILATERAL    Standing Status: Future     Number of Occurrences:      Standing Expiration Date: 11/12/2016    Order Specific Question:  Reason for Exam (SYMPTOM  OR DIAGNOSIS REQUIRED)    Answer:  well woman    Order Specific Question:  Is the patient pregnant?    Answer:  No    Order Specific Question:  [facility]?    [facility]   No orders of the defined types were placed in this encounter.    Follow up as needed or for annual exam.

## 2015-10-05 ENCOUNTER — Other Ambulatory Visit: Payer: Self-pay | Admitting: Certified Nurse Midwife

## 2015-10-05 DIAGNOSIS — Z1231 Encounter for screening mammogram for malignant neoplasm of breast: Secondary | ICD-10-CM

## 2016-01-09 ENCOUNTER — Encounter: Payer: Self-pay | Admitting: Dietician

## 2016-01-09 ENCOUNTER — Encounter: Payer: 59 | Attending: Physician Assistant | Admitting: Dietician

## 2016-01-09 DIAGNOSIS — E282 Polycystic ovarian syndrome: Secondary | ICD-10-CM | POA: Diagnosis present

## 2016-01-09 DIAGNOSIS — E669 Obesity, unspecified: Secondary | ICD-10-CM | POA: Diagnosis present

## 2016-01-09 NOTE — Progress Notes (Signed)
Medical Nutrition Therapy:  Appt start time: 0805 end time:  0910.   Assessment:  Primary concerns today: Kelly Vargas is here today since she is struggling with obesity and has PCOS. It is very hard for her to lose weight. Has been to a dietitian 3-4 times and most she has been able to lose 80 lbs was down to 315 lbs before last hernia repair 3-4 years ago. Was going to the gym 5 x week, 1800 calories, 150 g of protein, and about 90 g of carbs. Got out of the habit of following diet and exercise after she had surgery. Currently having about 2500 calories.   Highest weight was 420 lbs (lost about 12 lbs recently). Metformin has increased to 1500 mg a month ago. Currently has an app on her phone and trying to log food. Started logging about 1 month ago but has not lost weight yet. Would like to get weight down under 300 lbs. Feels like accountabilty and cutting back on carbs helps her to lose weight. Transitioning to high protein and low carb diet. Buying groceries online so not tempted to buy extra foods.   Loves salty, fried foods. Not so much of a sweet eater including fruit. Does not eat much bread. Hungry in the morning and lunch and not as hungry at dinner. Used to snack in the evening but no longer does since she started taking Cymbalta.Trying to do more teas instead of sodas.   Works for [employer] at home 8-6. Going to school from home at night. Lives with her spouse Maryan Char) who is also overweight and she is following a modified Atkins diet. [name] likes to cook large dinner. Will often cook a lot on the weekends to have foods for the week. Does not miss or skip meals. Goes out to eat most Thursday nights (eats out about 2 x week). Used to eat out a lot.    Preferred Learning Style:    No preference indicated   Learning Readiness:   Ready  MEDICATIONS: see list   DIETARY INTAKE:  Usual eating pattern includes 3 meals and 1 snacks per day.  Avoided foods include: some fish,  asparagus, greens, doesn't love fruit    24-hr recall:  B ( AM): 2 pieces of white wheat with peanut butter toast or PB&J or Canadian bacon with tea with splenda or sometimes coffee Snk ( AM): none  L ( PM): leftovers from dinner - grilled meat Snk ( PM):  1-2 [place] ices or fiber one bar D ( PM): grilled 12 oz chicken salad with lettuce, mozzarella, ham Snk ( PM): none recently  Beverages: tea with splenda, water, diet pepsi    Usual physical activity: not much   Estimated energy needs: 1800 calories 200 g carbohydrates 135 g protein 50 g fat  Progress Towards Goal(s):  In progress.   Nutritional Diagnosis:  [place]-3.3 Overweight/obesity As related to hx of large portion sizes and PCOS.  As evidenced by BMI of 51.1.    Intervention:  Nutrition counseling provided. Plan: Try making ranch with fat free [place] yogurt with Hidden [facility] packets.  Try to eat meals without TV or computer when possible.  Try to take 20 minutes to eat, chew well, put your fork down between bites, drink water. Have seconds only if you are hungry.  Use smaller plates at dinner meal. Try fixing your own plate Aim to fill half of your plate with vegetables, 4-5 oz of protein, and quarter of your plate  with starch (what you can hold in your hand).  Try using protein shake for coffee creamer. Plan to walk in the morning around 10 AM 3 x week for 30 minutes.   Teaching Method Utilized:  Visual Auditory Hands on  Handouts given during visit include:  MyPlate Handou  1800 calorie meal plan  Heart Healthy Eating Nutrition Therapy   Barriers to learning/adherence to lifestyle change: spouse likes to cook large dinner meals  Demonstrated degree of understanding via:  Teach Back   Monitoring/Evaluation:  Dietary intake, exercise, and body weight in 1 month(s).

## 2016-01-09 NOTE — Patient Instructions (Addendum)
Try making ranch with fat free [place] yogurt with Hidden [facility] packets.  Try to eat meals without TV or computer when possible.  Try to take 20 minutes to eat, chew well, put your fork down between bites, drink water. Have seconds only if you are hungry.  Use smaller plates at dinner meal. Try fixing your own plate Aim to fill half of your plate with vegetables, 4-5 oz of protein, and quarter of your plate with starch (what you can hold in your hand).  Try using protein shake for coffee creamer. Plan to walk in the morning around 10 AM 3 x week for 30 minutes.

## 2016-01-30 ENCOUNTER — Other Ambulatory Visit: Payer: Self-pay | Admitting: Certified Nurse Midwife

## 2016-02-12 ENCOUNTER — Ambulatory Visit: Payer: Self-pay | Admitting: Dietician

## 2016-02-17 ENCOUNTER — Other Ambulatory Visit: Payer: Self-pay | Admitting: Certified Nurse Midwife

## 2016-02-17 DIAGNOSIS — Z3041 Encounter for surveillance of contraceptive pills: Secondary | ICD-10-CM

## 2016-03-06 ENCOUNTER — Other Ambulatory Visit: Payer: Self-pay | Admitting: Certified Nurse Midwife

## 2016-03-07 ENCOUNTER — Ambulatory Visit: Payer: Self-pay | Admitting: Dietician

## 2016-03-11 ENCOUNTER — Ambulatory Visit
Admission: RE | Admit: 2016-03-11 | Discharge: 2016-03-11 | Disposition: A | Discharge status: Home / Self Care | Payer: 59 | Source: Ambulatory Visit | Attending: Certified Nurse Midwife | Admitting: Certified Nurse Midwife

## 2016-03-11 ENCOUNTER — Encounter: Payer: 59 | Attending: Physician Assistant | Admitting: Dietician

## 2016-03-11 ENCOUNTER — Encounter: Payer: Self-pay | Admitting: Dietician

## 2016-03-11 DIAGNOSIS — E282 Polycystic ovarian syndrome: Secondary | ICD-10-CM | POA: Insufficient documentation

## 2016-03-11 DIAGNOSIS — E669 Obesity, unspecified: Secondary | ICD-10-CM | POA: Insufficient documentation

## 2016-03-11 DIAGNOSIS — Z1231 Encounter for screening mammogram for malignant neoplasm of breast: Secondary | ICD-10-CM

## 2016-03-11 NOTE — Patient Instructions (Addendum)
Plan to exercise while grilling dinner/increasing movement.  Try to get up once per hour from desk.  Think about doing some movements while on conference calls (get headset working). Have no more than 3 meals from a restaurant per week.  Try grilling extra food for times when you can't cook.    Try making ranch with fat free plain [place] yogurt with [employer] packets.  Try doing single serve popcorn and 1 [place] ice for snacks.

## 2016-03-11 NOTE — Progress Notes (Signed)
  Medical Nutrition Therapy:  Appt start time: 0735 end time:  810   Assessment:  Primary concerns today: Kelly Vargas returns today with a 1 lb weight loss. Expected to lose more weight. Has cut out a lot of snacks (chips and sweets) and reduced soda intake. Having Dajeeling tea instead. Has noticed that she eats out a lot more than she thought she did. Started documenting food on Lose It app eating and about 2000 calories per day. Wife is doing Atkins diet now so has cut carbs a lot. Ordering groceries from [employer] now so she doesn't go in the store. Has not been able to fit in walks though trying to be more active doing yard work.   Currently back in school this semester.   Preferred Learning Style:    No preference indicated   Learning Readiness:   Ready  MEDICATIONS: see list   DIETARY INTAKE:  Usual eating pattern includes 3 meals and 1 snacks per day.  Avoided foods include: some fish, asparagus, greens, doesn't love fruit    24-hr recall:  B ( AM): 2 pieces of white wheat with peanut butter toast or PB&J or [place] bacon with tea with splenda or sometimes coffee Snk ( AM): none  L ( PM): leftovers from dinner - grilled meat with zucchini or bags of lettuce with tuna with Ginger dressing or cottage cheese with fruit Snk ( PM):  None or popcorn (simply salted) D ( PM): vegetables with grilled beef, [place] keilbasa, shrimp, or 4 oz chicken stir fry  Snk ( PM): 1-2 [place] ice or Sugar free fudge bars Beverages: water, 3 glasses diet pepsi, [place] tea with splenda, 2 cups coffee with sugar free french vanilla  Usual physical activity: not much   Estimated energy needs: 1800 calories 200 g carbohydrates 135 g protein 50 g fat  Progress Towards Goal(s):  In progress.   Nutritional Diagnosis:  [place]-3.3 Overweight/obesity As related to hx of large portion sizes and PCOS.  As evidenced by BMI of 51.1.    Intervention:  Nutrition counseling provided. Plan: Plan to  exercise while grilling dinner/increasing movement.  Try to get up once per hour from desk.  Think about doing some movements while on conference calls (get headset working). Have no more than 3 meals from a restaurant per week.  Try grilling extra food for times when you can't cook.    Try making ranch with fat free plain [place] yogurt with [employer] packets.  Try doing single serve popcorn and 1 [place] ice for snacks.   Teaching Method Utilized:  Visual Auditory Hands on  Handouts given during visit include:  none  Barriers to learning/adherence to lifestyle change: none  Demonstrated degree of understanding via:  Teach Back   Monitoring/Evaluation:  Dietary intake, exercise, and body weight in 2 month(s).

## 2016-04-09 ENCOUNTER — Other Ambulatory Visit: Payer: Self-pay | Admitting: Obstetrics

## 2016-04-09 DIAGNOSIS — Z3041 Encounter for surveillance of contraceptive pills: Secondary | ICD-10-CM

## 2016-05-20 ENCOUNTER — Encounter: Payer: 59 | Attending: Physician Assistant | Admitting: Dietician

## 2016-05-20 DIAGNOSIS — E669 Obesity, unspecified: Secondary | ICD-10-CM | POA: Diagnosis not present

## 2016-05-20 DIAGNOSIS — E282 Polycystic ovarian syndrome: Secondary | ICD-10-CM | POA: Diagnosis present

## 2016-05-20 NOTE — Patient Instructions (Addendum)
Plan to exercise on Saturdays and increase when you can. Think about doing some movements while on conference calls (get headset working).  Try measuring popcorn - 3 cups is a serving. Try chewing raw vegetables if needing to chew and not hungry. Try to focus on food and not eat while doing other things when possible. Try to have a boiled with 1 oatmeal in the morning.  Try to cut back on coffee creamer (try Premier Protein shake).  For the next the 3-4 weeks, focus on moving more, sleeping more, cutting back on extra foods/carbs.

## 2016-05-20 NOTE — Progress Notes (Signed)
  Medical Nutrition Therapy:  Appt start time: 0835 end time:  905   Assessment:  Primary concerns today: Kelly Vargas returns today with a 3 lb weight gain in past 2 months. Has still been working on cutting back on sweets and chips. Had [place] food over the weekend. Has been drinking more water and Sprite Zero.Kelly Vargas is still on Atkins diet and has lost 36 lbs. Still ordering groceries from [employer].  Both her and her wife are in school. Making a lot of food on weekends. Uses an air fryer.  Not using the Lose it app anymore.   Preferred Learning Style:    No preference indicated   Learning Readiness:   Ready  MEDICATIONS: see list   DIETARY INTAKE:  Usual eating pattern includes 3 meals and 1 snacks per day.  Avoided foods include: some fish, asparagus, greens, doesn't love fruit    24-hr recall:  B ( AM): 2 apple cinnamon oatmeal packets (am): none  L ( PM): sandwich with lunch meat or peanut butter and jelly leftovers from dinner - grilled meat with zucchini or bags of lettuce with tuna with Ginger dressing or cottage cheese with fruit Snk ( PM):  None or popcorn D ( PM): vegetables with grilled beef, [place] keilbasa, shrimp, or 4 oz chicken stir fry  Snk ( PM): 1-2 [place] ices or Sugar free fudge bars Beverages: water, Sprite Zero, 2 cups coffee with sugar free french vanilla  Usual physical activity: not much besides housework  Estimated energy needs: 1800 calories 200 g carbohydrates 135 g protein 50 g fat  Progress Towards Goal(s):  In progress.   Nutritional Diagnosis:  [place]-3.3 Overweight/obesity As related to hx of large portion sizes and PCOS.  As evidenced by BMI of 51.1.    Intervention:  Nutrition counseling provided. Plan: Plan to exercise on Saturdays and increase when you can. Think about doing some movements while on conference calls (get headset working).  Try measuring popcorn - 3 cups is a serving. Try chewing raw vegetables if needing to chew and  not hungry. Try to focus on food and not eat while doing other things when possible. Try to have a boiled with 1 oatmeal in the morning.  Try to cut back on coffee creamer (try Premier Protein shake).  For the next the 3-4 weeks, focus on moving more, sleeping more, cutting back on extra foods/carbs.  Teaching Method Utilized:  Visual Auditory Hands on  Handouts given during visit include:  none  Barriers to learning/adherence to lifestyle change: none  Demonstrated degree of understanding via:  Teach Back   Monitoring/Evaluation:  Dietary intake, exercise, and body weight in 1 month(s).

## 2016-06-13 ENCOUNTER — Encounter: Payer: 59 | Attending: Physician Assistant | Admitting: Dietician

## 2016-06-13 DIAGNOSIS — E669 Obesity, unspecified: Secondary | ICD-10-CM | POA: Insufficient documentation

## 2016-06-13 DIAGNOSIS — E282 Polycystic ovarian syndrome: Secondary | ICD-10-CM | POA: Diagnosis present

## 2016-06-13 NOTE — Patient Instructions (Addendum)
Continue to exercise/do projects on Saturdays and increase when you can. Continue doing some movements while on conference calls (get headset working).  Continue documenting food choices.  Try to focus on food and not eat while doing other things when possible. Try to cut back on coffee creamer (try Premier Protein shake). Continue to try to get 8 hours of sleep.

## 2016-06-13 NOTE — Progress Notes (Signed)
  Medical Nutrition Therapy:  Appt start time: 0950 end time: 1010    Assessment:  Primary concerns today: Kelly Vargas returns today with a 4 lb weight loss in last 4 weeks. Has been cutting back on coffee creamer and [place] ices. When she wasn't in school for 3 weeks was able sleep more and work out more in the yard.   Has only class this fall and work is stressful. Back to documenting calories (around 1600).   Preferred Learning Style:    No preference indicated   Learning Readiness:   Ready  MEDICATIONS: see list   DIETARY INTAKE:  Usual eating pattern includes 3 meals and 1 snacks per day.  Avoided foods include: some fish, asparagus, greens, doesn't love fruit    24-hr recall:  B ( AM): 1 boiled egg and 2 more whites with a pear (am): none  L ( PM): sandwich with lunch meat or peanut butter and jelly leftovers from dinner - grilled meat with zucchini or bags of lettuce with tuna with Ginger dressing or cottage cheese with fruit Snk ( PM):  None or protein bar power bar  D ( PM): vegetables with grilled beef, [place] keilbasa, shrimp, fish sticks, or 4 oz chicken stir fry  Snk ( PM): 1 [place] ices  Beverages: water, Sprite Zero, 2 cups coffee with 2 tablespoons sugar free french vanilla  Usual physical activity: more yard work, parking further away and move around more   Estimated energy needs: 1800 calories 200 g carbohydrates 135 g protein 50 g fat  Progress Towards Goal(s):  In progress.   Nutritional Diagnosis:  -3.3 Overweight/obesity As related to hx of large portion sizes and PCOS.  As evidenced by BMI of 51.1.    Intervention:  Nutrition counseling provided. Plan: Continue to exercise/do projects on Saturdays and increase when you can. Continue doing some movements while on conference calls (get headset working).  Continue documenting food choices.  Try to focus on food and not eat while doing other things when possible. Try to cut back on coffee creamer  (try Premier Protein shake). Continue to try to get 8 hours of sleep.   Teaching Method Utilized:  Visual Auditory Hands on  Handouts given during visit include:  none  Barriers to learning/adherence to lifestyle change: none  Demonstrated degree of understanding via:  Teach Back   Monitoring/Evaluation:  Dietary intake, exercise, and body weight in 1 month(s).

## 2016-07-18 ENCOUNTER — Ambulatory Visit: Payer: 59 | Admitting: Dietician

## 2016-08-01 ENCOUNTER — Encounter: Payer: 59 | Attending: Physician Assistant | Admitting: Dietician

## 2016-08-01 DIAGNOSIS — E669 Obesity, unspecified: Secondary | ICD-10-CM | POA: Insufficient documentation

## 2016-08-01 DIAGNOSIS — E282 Polycystic ovarian syndrome: Secondary | ICD-10-CM | POA: Diagnosis present

## 2016-08-01 NOTE — Progress Notes (Signed)
  Medical Nutrition Therapy:  Appt start time: 0820 end time: 900   Assessment:  Primary concerns today: Kelly Vargas returns today with a 9 lb weight gain in past 6 weeks. Seeing a psychiatrist and started Cymbalta. Work has been very busy along with school. Has not had time to cook or eat right.   Preferred Learning Style:    No preference indicated   Learning Readiness:   Ready  MEDICATIONS: see list   DIETARY INTAKE:  Usual eating pattern includes 3 meals and 1 snacks per day.  Avoided foods include: some fish, asparagus, greens, doesn't love fruit    24-hr recall:  B ( AM): 1 boiled egg and 2 more whites sometimes with candian bacon or peanut butter and jelly  (am): none  L ( PM): protein bar, sandwich with lunch meat or peanut butter and jelly leftovers from dinner - grilled meat with zucchini or bags of lettuce with tuna with Ginger dressing or cottage cheese with fruit Snk ( PM):  None or protein bar power bar  D ( PM): vegetables with grilled beef, [place] ke[place]basa, shrimp, fish sticks, or 4 oz chicken stir fry, or meals from restaurants  Snk ( PM): none or 1 [place] ice  Beverages: water, Sprite Zero, 2 cups coffee with 2 tablespoons sugar free french vanilla, tea with splenda   Usual physical activity: none recently, parking further away and move around more   Estimated energy needs: 1800 calories 200 g carbohydrates 135 g protein 50 g fat  Progress Towards Goal(s):  In progress.   Nutritional Diagnosis:  [place]-3.3 Overweight/obesity As related to hx of large portion sizes and PCOS.  As evidenced by BMI of 51.1.    Intervention:  Nutrition counseling provided. Plan: Get new headset and look into sit/stand desk. Look into getting foot bike and arm band for your chair. Try cutting out coffee creamer.  Continue eating vegetables at dinner and and lunch when possible.  Consider doing frozen meals.   Teaching Method Utilized:  Visual Auditory Hands on  Handouts  given during visit include:  none  Barriers to learning/adherence to lifestyle change: none  Demonstrated degree of understanding via:  Teach Back   Monitoring/Evaluation:  Dietary intake, exercise, and body weight in 3 month(s).

## 2016-08-01 NOTE — Patient Instructions (Addendum)
Get new headset and look into sit/stand desk. Look into getting foot bike and arm band for your chair. Try cutting out coffee creamer.  Continue eating vegetables at dinner and and lunch when possible.  Consider doing frozen meals.

## 2016-08-18 ENCOUNTER — Encounter: Payer: Self-pay | Admitting: Neurology

## 2016-08-21 ENCOUNTER — Encounter: Payer: Self-pay | Admitting: Neurology

## 2016-08-21 ENCOUNTER — Ambulatory Visit (INDEPENDENT_AMBULATORY_CARE_PROVIDER_SITE_OTHER): Payer: 59 | Admitting: Neurology

## 2016-08-21 VITALS — BP 142/96 | HR 81 | Resp 20 | Ht 72.0 in | Wt >= 6400 oz

## 2016-08-21 DIAGNOSIS — G4733 Obstructive sleep apnea (adult) (pediatric): Secondary | ICD-10-CM

## 2016-08-21 DIAGNOSIS — G4731 Primary central sleep apnea: Secondary | ICD-10-CM

## 2016-08-21 NOTE — Patient Instructions (Signed)
We will reduce your BiPAP ST pressure to 15/11 cm.  Keep up the good work! I will see you back in 12 months for sleep apnea check up.

## 2016-08-21 NOTE — Progress Notes (Signed)
Subjective:    Patient ID: Kelly Vargas is a 45 y.o. female.  HPI     Interim history:   Kelly Vargas is a 45 year old right-handed woman with an underlying medical history of polycystic ovarian syndrome, IBS, hypertension, reflux disease, obesity, depression and anxiety, PTSD, status post ventral hernia and umbilical hernia repairs, lumbar laminectomy, tonsillectomy and adenoidectomy, who presents for follow-up consultation of her sleep disordered breathing, particularly her central sleep apnea, treated with BiPAP ST. The patient is unaccompanied today. I last saw her on 08/22/2015, at which time she was compliant with BiPAP ST. She had more stress secondary to working full time and she was in online school as well. She had started spironolactone 50 mg once daily and an OCP for her PCOS. She had lost a little bit of weight. She was complaining of urinary urgency and nocturia as well as incontinence at times.   Today, 08/21/2016: The reviewed her BiPAP compliance data from 07/20/2016 through 08/18/2016 which is a total of 30 days, during which time she used her BiPAP every night with percent used days greater than 4 hours at 100%, indicating superb compliance with an average usage of 8 hours and 28 minutes, residual AHI low at 0.6 per hour, leak with the 95th percentile at 73 L/m on a pressure of 17/13 with a rate of 12.   Today, 08/21/2016: She reports doing okay, stress at work, getting her masters degree, through [employer]. She is compliant with BiPAP, has a chinstrap, bite guard broke, getting a new one made in Jan. She had Some interim medication changes, no longer on birth control pill because of some side effects, still on Aldactone and metformin, she has been trying to drink enough water but also drinks sodas and tea. She is no longer on Lexapro and Cymbalta together, she is just on Cymbalta at this time.  Previously:   I saw her on 02/20/2015, at which time she reported doing fairly well.  She was trying to lose weight and had lost about 11 pounds. 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 She was enrolling in the masters program. She was seeking care with a new primary care physician. She was using a small fullface mask. She was compliant with BiPAP treatment. I encouraged her to continue with treatment and to strive for weight loss.   I reviewed her BiPAP compliance data from 07/22/2015 through 08/20/2015 which is a total of 30 days during which time she used her machine every night with percent used days greater than 4 hours at 97%, indicating excellent compliance with an average usage of 8 hours and 29 minutes, residual AHI low at 0.1 per hour, leak consistently high with the 95th percentile of leak at 65.1 L/m on a pressure of 17/13 with a rate of 12.    I saw her on 10/27/2014, at which time she reported doing fairly well. Unfortunately, leak from the mask was still high. Her lower extremity edema was no better. Her blood pressure values were good. Her nocturia was much improved from before treatment. I requested a reduction in her pressure to 19/15 cm.   I reviewed her BiPAP compliance data from 01/18/2015 through 02/16/2015 which is a total of 30 days during which time she used her machine every night, percent used days greater than 4 hours was under percent, indicating superb compliance with an average usage of 8 hours and 34 minutes for all nights, residual AHI low at 0.2 per hour  but leak still high, albeit improved with the 95th percentile at 63.2 L/m. Pressure at 19/15 with a rate of 12.   I saw her on 07/26/2014, at which time we talked about her sleep test results and her compliance data. She had obstructive sleep apnea which did not respond completely to CPAP therapy and standard BiPAP therapy and she started having central apneas on standard BiPAP. She was therefore switched to BiPAP ST. She had good compliance and indicated good results. She reported sleeping  better and improvement of her nocturia as well as improved daytime alertness. Leak was high and I suggested we request a mask refit and reduction in BiPAP pressure 19/15 cm but both things were actually not gone. I have emailed her DME provider today. The patient states that she was mailed another fullface mask but this was even worse in terms of fit and her mouth would come open. She is trying to get on track with her weight loss.    I reviewed her BiPAP ST compliance data from 09/24/2014 through 10/23/2014 which is a total of 30 days during which time she was under percent compliant. Average usage of 7 hours and 52 minutes with a pressure of 21/17 and the rate of 12. Residual AHI at 1 per hour and leak at times high with the 95th percentile at 75.6 L/m.   I first met her on 05/13/2014 at the request of her psychiatrist, at which time the patient reported difficulty with sleep maintenance with frequent nighttime awakenings, snoring, nocturia, and witnessed apneas. I asked her to return for a sleep study. She had a split-night sleep study on 06/07/2014 and went over her test results with her in detail today. Her baseline sleep efficiency was 62.9% with a latency to sleep of 15.5 minutes and wake after sleep onset of 21 minutes with moderate sleep fragmentation noted. She had an increased percentage of stage I sleep, an increased percentage of slow-wave sleep, and absence of REM sleep. No EKG changes were noted and no significant PLMS. She had loud snoring. She slept on her back only. Her total AHI was 61 per hour. Baseline oxygen saturation was only 88%, nadir was 80%. She was therefore started on CPAP therapy. Sleep efficiency was improved. She had an increased percentage of REM sleep at 39.3%. Average oxygen saturation was 89%, nadir was 78%. The patient was started on CPAP. She was first tried on a nasal mask but had significant mouth opening. She was titrated from 5-15 cm. She had significant residual sleep  disordered breathing and therefore was switched to BiPAP therapy. She was titrated from 16/12 cm to 20/16 cm. She had central apneas. She was switched to BiPAP ST. She was titrated to a final pressure of 21/17 with a rate of 12. Residual AHI was 0 on the final setting with her O2 nadir of 89% on the final setting. Based on her test results I tarted her on BiPAP ST at home.   I reviewed her BiPAP compliance data from 06/21/2014 through 07/20/2014 which is a total of 30 days during which time she was under percent compliant. Percent used days greater than 4 hours was 100%, indicating superb compliance. Average usage of 8 hours and 17 minutes. Pressure at 21/17 with a rate of 12. Residual AHI 1 per hour, leak was high.   She works from home for [employer]. She has had sleep walking and sleep eating into adulthood. She has tried Nyquil, Xanax, benadryl. She used to get morning  HAs, but these improved since she starting using a custom made bite guard for bruxism. She has had nocturia x 2-3 times, but also nocturnal incontinence. She denies RLS symptoms and is not known to kick in her sleep.   Her typical bedtime is reported to be around MN and usual wake time is around 7:30 AM. Sleep onset typically occurs within minutes after reading on her Kindle. She reports feeling poorly rested upon awakening. She wakes up on an average 3 times in the middle of the night and has to go to the bathroom 2-3 times on a typical night.   She reports excessive daytime somnolence (EDS) and Her Epworth Sleepiness Score (ESS) is 6/24 today. She has not fallen asleep while driving. The patient has been taking a nap during lunch time at times.   She has been known to snore for the past many years. Snoring is reportedly marked, and associated with choking sounds and witnessed apneas. The patient admits to a sense of choking or strangling feeling. There is report of nighttime reflux, and she has to sleep on 2 pillows. She prefers to sleep on the  R side. There is no nighttime cough experienced. The patient has not noted any RLS symptoms and is not known to kick while asleep or before falling asleep. There is family history of insomnia, but no obvious RLS or OSA.   She is a restless sleeper and in the morning, the bed is quite disheveled.    She denies cataplexy, sleep paralysis, hypnagogic or hypnopompic hallucinations, or sleep attacks. She does report any vivid dreams, some nightmares, no dream enactments, but parasomnias, such as sleep eating, and sleep walking. The patient has not had a sleep study or a home sleep test.   She consumes 5-6 caffeinated beverages per day, usually in the form of coffee and sodas, as late as dinner. Her bedroom is usually dark and cool. There is no TV in the bedroom.    Her Past Medical History Is Significant For: Past Medical History:  Diagnosis Date  . Anxiety   . Asthma   . Depression   . GERD (gastroesophageal reflux disease)   . Hernia   . Hypertension   . IBS (irritable bowel syndrome)   . Migraine   . Morbid obesity ([place])   . Polycystic ovarian disease   . Rosacea   . Sleep apnea     Her Past Surgical History Is Significant For: Past Surgical History:  Procedure Laterality Date  . ADENOIDECTOMY    . BACK SURGERY    . HERNIA REPAIR    . TONSILECTOMY, ADENOIDECTOMY, BILATERAL MYRINGOTOMY AND TUBES    . UMBILICAL HERNIA REPAIR    . ventrical hernia repair      Her Family History Is Significant For: Family History  Problem Relation Age of Onset  . Heart disease Mother   . Cancer Mother   . Stroke Mother   . Diabetes Mother   . Hypertension Mother   . Heart disease Father   . Hypertension Brother   . High Cholesterol Brother   . Depression Brother     Her Social History Is Significant For: Social History   Social History  . Marital status: Legally Separated    Spouse name: [name]  . Number of children: 0  . Years of education: [employer].   Occupational History  .  [employer]   Social History Main Topics  . Smoking status: Former Smoker    Packs/day: 1.00  Years: 20.00    Types: Cigarettes    Quit date: 09/02/2006  . Smokeless tobacco: Never Used  . Alcohol use 0.0 oz/week     Comment: occasionally  . Drug use: No  . Sexual activity: Yes    Partners: Female   Other Topics Concern  . None   Social History Narrative   Patient lives at home with spouse.   Caffeine Use: 5-6 cups of coffee daily    Her Allergies Are:  Allergies  Allergen Reactions  . Chamomile Swelling  . Contrast Media [Iodinated Diagnostic Agents]     Renal failure   . Erythromycin   . Penicillins   :   Her Current Medications Are:  Outpatient Encounter Prescriptions as of 08/21/2016  Medication Sig  . Albuterol (VENTOLIN IN) Inhale 2 Inhalers into the lungs 3 (three) times daily as needed.    . ALPRAZolam (XANAX) 0.5 MG tablet Take 0.5 mg by mouth 2 (two) times daily as needed.    Marland Kitchen aspirin 81 MG chewable tablet Chew 81 mg by mouth daily.  . Cholecalciferol (VITAMIN D PO) Take 5,000 mg by mouth daily.  . cyanocobalamin 500 MCG tablet Take 500 mcg by mouth daily.  Mariane Baumgarten Sodium (COLACE PO) Take 1 tablet by mouth daily.  . DULoxetine (CYMBALTA) 20 MG capsule Take 20 mg by mouth daily.  . famotidine (PEPCID) 20 MG tablet Take 20 mg by mouth daily.   . fexofenadine (ALLEGRA) 180 MG tablet Take 180 mg by mouth daily.  Marland Kitchen FLUVIRIN SUSP   . hydrochlorothiazide (HYDRODIURIL) 12.5 MG tablet Take 25 mg by mouth daily.   . metFORMIN (GLUMETZA) 500 MG (MOD) 24 hr tablet Take 1 tablet (500 mg total) by mouth 2 (two) times daily with a meal.  . Prenatal Vit-Fe Fumarate-FA (M-VIT PO) Take by mouth.    . spironolactone (ALDACTONE) 50 MG tablet Take 1 tablet (50 mg total) by mouth daily. Take with breakfast.  . [DISCONTINUED] escitalopram (LEXAPRO) 20 MG tablet Take 20 mg by mouth daily.    . [DISCONTINUED] TRI-SPRINTEC 0.18/0.215/0.25 MG-35 MCG tablet Take 1 tablet by mouth   daily   No facility-administered encounter medications on file as of 08/21/2016.   :  Review of Systems:  Out of a complete 14 point review of systems, all are reviewed and negative with the exception of these symptoms as listed below:  Review of Systems  Neurological:       Pt presents today to follow up on her cpap. Pt has no complaints today.    Objective:  Neurologic Exam  Physical Exam Physical Examination:   Vitals:   08/21/16 1046  BP: (!) 142/96  Pulse: 81  Resp: 20    General Examination: The patient is a very pleasant 45 y.o. female in no acute distress. She appears well-developed and well-nourished and well groomed. She is morbidly obese. Good spirits today.   HEENT: Normocephalic, atraumatic, pupils are equal, round and reactive to light and accommodation. Extraocular tracking is good without limitation to gaze excursion or nystagmus noted. Normal smooth pursuit is noted. Hearing is grossly intact. Face is symmetric with normal facial animation and normal facial sensation. Speech is clear with no dysarthria noted. There is no hypophonia. There is no lip, neck/head, jaw or voice tremor. Neck is supple with full range of passive and active motion. There are no carotid bruits on auscultation. Oropharynx exam reveals: mild to moderate mouth dryness, adequate dental hygiene and mild to moderate airway crowding, due to  small airway opening and swollen uvula. Mallampati is class II. Tongue protrudes centrally and palate elevates symmetrically. Tonsils are absent. She has a Mild overbite.   Chest: Clear to auscultation without wheezing, rhonchi or crackles noted.  Heart: S1+S2+0, regular and normal without murmurs, rubs or gallops noted.   Abdomen: Soft, non-tender and non-distended with normal bowel sounds appreciated on auscultation.  Extremities: There is trace edema in the distal lower extremities bilaterally.   Skin: Warm and dry without trophic changes noted.    Musculoskeletal: exam reveals no obvious joint deformities, tenderness or joint swelling or erythema.   Neurologically:  Mental status: The patient is awake, alert and oriented in all 4 spheres. Her immediate and remote memory, attention, language skills and fund of knowledge are appropriate. There is no evidence of aphasia, agnosia, apraxia or anomia. Speech is clear with normal prosody and enunciation. Thought process is linear. Mood is normal and affect is normal.  Cranial nerves II - XII are as described above under HEENT exam. In addition: shoulder shrug is normal with equal shoulder height noted. Motor exam: Normal bulk, strength and tone is noted. There is no drift, tremor or rebound. Romberg is negative. Reflexes are 1+ throughout. Fine motor skills and coordination: intact with normal finger taps, normal hand movements, normal rapid alternating patting, normal foot taps and normal foot agility.  Cerebellar testing: No dysmetria or intention tremor on finger to nose testing. Heel to shin is very difficult for her due to body habitus. There is no truncal or gait ataxia.  Sensory exam: intact to light touch in the upper and lower extremities.  Gait, station and balance: She stands easily. No veering to one side is noted. No leaning to one side is noted. Posture is age-appropriate and stance is narrow based. Gait shows normal stride length and normal pace. No problems turning are noted. She turns en bloc. Tandem walk is slightly difficult for her, d/t body habitus.   Assessment and Plan:   In summary, Kelly Vargas is a very pleasant 45 year old female with an underlying medical history of polycystic ovarian syndrome, IBS, hypertension, reflux disease, morbid obesity, depression and anxiety, status post ventral hernia and umbilical hernia repairs, lumbar laminectomy, tonsillectomy and adenoidectomy, who presents for follow up consultation of her severeSleep disordered breathing. She had severe  central apneas with CPAP and did well with BiPAP ST. She has been fully compliant with treatment. We reduced her pressure last year because of excess leak. She is still experiencing a lot of air leakage from the mask. She has been using a Simplus full facemask. She was tried on a quadruple full facemask in the sleep lab. She has had a couple of visits with her DME company for mask refit. She uses a chinstrap as well. She recently broke her bite guard that she was using for tooth grinding. She is going to get a new one made after the first of the year. Physical exam is stable. She is commended for her treatment adherence. We briefly reviewed her split-night sleep study results from 06/07/2014. She has a history of severe central apnea while on CPAP. She was tried on BiPAP, but did best on BiPAP ST. Residual AHI is less than 1 per hour, I suggested that we again try to reduce her BiPAP pressure  to see if we can reduce her leak. To that end, I suggested 15/11 cm, rate at 12/m. She is advised to follow-up in one year as she is doing well  and is stable.  I answered all her questions today and she was in agreement. I spent 20 minutes in total face-to-face time with the patient, more than 50% of which was spent in counseling and coordination of care, reviewing test results, reviewing medication and discussing or reviewing the diagnosis of OSA, its prognosis and treatment options.

## 2016-11-12 ENCOUNTER — Ambulatory Visit: Payer: 59 | Admitting: Dietician

## 2017-04-10 ENCOUNTER — Encounter: Payer: 59 | Attending: Family

## 2017-04-10 DIAGNOSIS — E119 Type 2 diabetes mellitus without complications: Secondary | ICD-10-CM | POA: Diagnosis not present

## 2017-04-10 DIAGNOSIS — E669 Obesity, unspecified: Secondary | ICD-10-CM | POA: Insufficient documentation

## 2017-04-10 DIAGNOSIS — Z713 Dietary counseling and surveillance: Secondary | ICD-10-CM | POA: Insufficient documentation

## 2017-04-10 NOTE — Progress Notes (Signed)
Patient was seen on 04/10/17 for the first of a series of three diabetes self-management courses at the [facility].  Patient Education Plan per assessed needs and concerns is to attend four course education program for Diabetes Self Management Education.  The following learning objectives were met by the patient during this class:  Describe diabetes  State some common risk factors for diabetes  Defines the role of glucose and insulin  Identifies type of diabetes and pathophysiology  Describe the relationship between diabetes and cardiovascular risk  State the members of the Healthcare Team  States the rationale for glucose monitoring  State when to test glucose  State their individual Target Range  State the importance of logging glucose readings  Describe how to interpret glucose readings  Identifies A1C target  Explain the correlation between A1c and eAG values  State symptoms and treatment of high blood glucose  State symptoms and treatment of low blood glucose  Explain proper technique for glucose testing  Identifies proper sharps disposal  Handouts given during class include:  Living Well with Diabetes book  Carb Counting and Meal Planning book  Meal Plan Card  Carbohydrate guide  Meal planning worksheet  Low Sodium Flavoring Tips  The diabetes portion plate  B2W to eAG Conversion Chart  Diabetes Medications  Diabetes Recommended Care Schedule  Support Group  Diabetes Success Plan  Core Class Satisfaction Survey   . The patient's Newest Vital Sign Health Literacy Assessment score was 6. . The patient scored 94% on the Diabetes Knowledge pre-test. . Educational strategies utilized during class included repetition, teach-back, eliminating medical jargon, and being open to questions.   Follow-Up Plan:  Attend core 2

## 2017-04-15 ENCOUNTER — Ambulatory Visit: Payer: 59

## 2017-04-17 DIAGNOSIS — Z713 Dietary counseling and surveillance: Secondary | ICD-10-CM | POA: Diagnosis not present

## 2017-04-17 DIAGNOSIS — E119 Type 2 diabetes mellitus without complications: Secondary | ICD-10-CM

## 2017-04-17 NOTE — Progress Notes (Signed)
Patient was seen on 04/17/17 for the second of a series of three diabetes self-management courses at the [facility]. The following learning objectives were met by the patient during this class:   Describe the role of different macronutrients on glucose  Explain how carbohydrates affect blood glucose  State what foods contain the most carbohydrates  Demonstrate carbohydrate counting  Demonstrate how to read Nutrition Facts food label  Describe effects of various fats on heart health  Describe the importance of good nutrition for health and healthy eating strategies  Describe techniques for managing your shopping, cooking and meal planning  List strategies to follow meal plan when dining out  Describe the effects of alcohol on glucose and how to use it safely  Goals:  Follow Diabetes Meal Plan as instructed  Aim to spread carbs evenly throughout the day  Aim for 3 meals per day and snacks as needed Include lean protein foods to meals/snacks  Monitor glucose levels as instructed by your doctor   Follow-Up Plan:  Attend Core 3  Work towards following your personal food plan.   

## 2017-04-22 ENCOUNTER — Ambulatory Visit: Payer: 59

## 2017-04-24 ENCOUNTER — Encounter: Payer: 59 | Admitting: Dietician

## 2017-04-24 DIAGNOSIS — E119 Type 2 diabetes mellitus without complications: Secondary | ICD-10-CM

## 2017-04-24 DIAGNOSIS — Z713 Dietary counseling and surveillance: Secondary | ICD-10-CM | POA: Diagnosis not present

## 2017-04-24 NOTE — Progress Notes (Signed)
Patient was seen on 04/24/17 for the third of a series of three diabetes self-management courses at the [facility].   Kelly Vargas the amount of activity recommended for healthy living . Describe activities suitable for individual needs . Identify ways to regularly incorporate activity into daily life . Identify barriers to activity and ways to over come these barriers  Identify diabetes medications being personally used and their primary action for lowering glucose and possible side effects . Describe role of stress on blood glucose and develop strategies to address psychosocial issues . Identify diabetes complications and ways to prevent them  Explain how to manage diabetes during illness . Evaluate success in meeting personal goal . Establish 2-3 goals that they will plan to diligently work on until they return for the  63-month follow-up visit  Goals:   I will count my carb choices at most meals and snacks  I will be active 30 minutes or more 5 times a week  I will take my diabetes medications as scheduled  I will eat less unhealthy fats by eating less fried foods  To help manage stress I will  meditate at least 3 times a week  Your patient has identified these potential barriers to change:  Motivation Stress  Your patient has identified their diabetes self-care support plan as  Family Support  . The patient scored 94% on the Diabetes Knowledge post-test.   Plan:  Attend Support Group as desired

## 2017-04-29 ENCOUNTER — Ambulatory Visit: Payer: 59

## 2017-07-03 ENCOUNTER — Other Ambulatory Visit: Payer: Self-pay | Admitting: Certified Nurse Midwife

## 2017-07-03 DIAGNOSIS — L680 Hirsutism: Secondary | ICD-10-CM

## 2017-07-04 NOTE — Telephone Encounter (Signed)
Last AEX 09/15/15 with you.  Clearance [facility] last filled 07/17. Please advise

## 2017-08-21 ENCOUNTER — Ambulatory Visit: Payer: 59 | Admitting: Neurology

## 2017-10-23 ENCOUNTER — Encounter: Payer: Self-pay | Admitting: Neurology

## 2017-10-23 ENCOUNTER — Ambulatory Visit (INDEPENDENT_AMBULATORY_CARE_PROVIDER_SITE_OTHER): Payer: 59 | Admitting: Neurology

## 2017-10-23 VITALS — BP 161/103 | HR 112 | Ht 72.0 in | Wt >= 6400 oz

## 2017-10-23 DIAGNOSIS — G4731 Primary central sleep apnea: Secondary | ICD-10-CM | POA: Diagnosis not present

## 2017-10-23 DIAGNOSIS — G4733 Obstructive sleep apnea (adult) (pediatric): Secondary | ICD-10-CM | POA: Diagnosis not present

## 2017-10-23 NOTE — Patient Instructions (Signed)
Please continue using your BiPAP regularly. While your insurance requires that you use BiPAP at least 4 hours each night on 70% of the nights, I recommend, that you not skip any nights and use it throughout the night if you can. Getting used to BiPAP and staying with the treatment long term does take time and patience and discipline. Untreated obstructive sleep apnea when it is moderate to severe can have an adverse impact on cardiovascular health and raise her risk for heart disease, arrhythmias, hypertension, congestive heart failure, stroke and diabetes. Untreated obstructive sleep apnea causes sleep disruption, nonrestorative sleep, and sleep deprivation. This can have an impact on your day to day functioning and cause daytime sleepiness and impairment of cognitive function, memory loss, mood disturbance, and problems focussing. Using BiPAP regularly can improve these symptoms.  Please remember, common headache triggers are: sleep deprivation, dehydration, overheating, stress, hypoglycemia or skipping meals and blood sugar fluctuations, excessive pain medications or excessive alcohol use or caffeine withdrawal. Some people have food triggers such as aged cheese, orange juice or chocolate, especially dark chocolate, or MSG (monosodium glutamate). Try to avoid these headache triggers as much possible. It may be helpful to keep a headache diary to figure out what makes your headaches worse or brings them on and what alleviates them. Some people report headache onset after exercise but studies have shown that regular exercise may actually prevent headaches from coming. If you have exercise-induced headaches, please make sure that you drink plenty of fluid before and after exercising and that you do not over do it and do not overheat.  I would like to suggest for help with migraine reduction, make sure that you drink at least 3 bottles of water per day, allow for enough sleep time of 7-8 hours, stress reduction  of course, blood pressure management, and see your new gyn.   For your foot pain and foot discoloration at night, please talk to your primary care about seeing a vascular specialist.

## 2017-10-23 NOTE — Progress Notes (Signed)
Subjective:    Patient ID: Kelly Vargas is a 47 y.o. female.  HPI     Interim history:   Kelly Vargas is a 47 year old right-handed woman with an underlying medical history of polycystic ovarian syndrome, IBS, hypertension, reflux disease, obesity, depression and anxiety, PTSD, status post ventral hernia and umbilical hernia repairs, lumbar laminectomy, tonsillectomy and adenoidectomy, who presents for follow-up consultation of her complex sleep apnea, treated with BiPAP ST. The patient is unaccompanied today. Of note, she missed an appointment on 08/21/2017. I last saw her on 08/21/2016, at which time she was fully compliant with her BiPAP ST. She was doing well overall. She was advised to follow-up routinely in one year. I did suggest we reduced her BiPAP pressure because of high leak. I reduced to 15/11 cm with a rate of 12.  Today, 10/23/2017: I reviewed her BiPAP compliance data from 09/22/2017 through 10/21/2017 which is a total of 30 days, during which time she used her BiPAP every night with percent used days greater than 4 hours at 100%, indicating superb compliance with an average usage of 8 hours and 26 minutes, residual AHI at goal at 0.5 per hour, leak high with the 95th percentile at 50.8 L/m on a pressure of 15/11 cm with a backup rate of 12. 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 Her metformin was increased because her A1c was increased as well. She recently started having menstrual periods again, after a gap of almost 1 year, her previous GYN moved away, she needs to establish care with a new GYN. She has had more headaches, she used to have migraines him a particularly before her menstrual period. She has noticed  foot pain including heat sensation at night and bright red discoloration of her feet at night. This is painful at the time.   The patient's allergies, current medications, family history, past medical history, past social history, past surgical history and problem list were reviewed and updated as appropriate.    Previously:   I saw her on 08/22/2015, at which time she was compliant with BiPAP ST. She had more stress secondary to working full time and she was in online school as well. She had started spironolactone 50 mg once daily and an OCP for her PCOS. She had lost a little bit of weight. She was complaining of urinary urgency and nocturia as well as incontinence at times.    08/21/2016: The reviewed her BiPAP compliance data from 07/20/2016 through 08/18/2016 which is a total of 30 days, during which time she used her BiPAP every night with percent used days greater than 4 hours at 100%, indicating superb compliance with an average usage of 8 hours and 28 minutes, residual AHI low at 0.6 per hour, leak with the 95th percentile at 73 L/m on a pressure of 17/13 with a rate of 12.    I saw her on 02/20/2015, at which time she reported doing fairly well. She was trying to lose weight and had lost about 11 pounds. 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 She was enrolling in the masters program. She was seeking care with a new primary care physician. She was using a small fullface mask. She was compliant with  BiPAP treatment. I encouraged her to continue with treatment and to strive for weight loss.   I reviewed her BiPAP compliance data from 07/22/2015 through 08/20/2015 which is a total of 30 days during which time she used her machine every night with percent used days greater than 4 hours at 97%, indicating excellent compliance with an average usage of 8 hours and 29 minutes, residual AHI low at 0.1 per hour, leak consistently high with the 95th percentile of leak at 65.1 L/m on a  pressure of 17/13 with a rate of 12.   I saw her on 10/27/2014, at which time she reported doing fairly well. Unfortunately, leak from the mask was still high. Her lower extremity edema was no better. Her blood pressure values were good. Her nocturia was much improved from before treatment. I requested a reduction in her pressure to 19/15 cm.   I reviewed her BiPAP compliance data from 01/18/2015 through 02/16/2015 which is a total of 30 days during which time she used her machine every night, percent used days greater than 4 hours was under percent, indicating superb compliance with an average usage of 8 hours and 34 minutes for all nights, residual AHI low at 0.2 per hour but leak still high, albeit improved with the 95th percentile at 63.2 L/m. Pressure at 19/15 with a rate of 12.   I saw her on 07/26/2014, at which time we talked about her sleep test results and her compliance data. She had obstructive sleep apnea which did not respond completely to CPAP therapy and standard BiPAP therapy and she started having central apneas on standard BiPAP. She was therefore switched to BiPAP ST. She had good compliance and indicated good results. She reported sleeping better and improvement of her nocturia as well as improved daytime alertness. Leak was high and I suggested we request a mask refit and reduction in BiPAP pressure 19/15 cm but both things were actually not gone. I have emailed her DME provider today. The patient states that she was mailed another fullface mask but this was even worse in terms of fit and her mouth would come open. She is trying to get on track with her weight loss.    I reviewed her BiPAP ST compliance data from 09/24/2014 through 10/23/2014 which is a total of 30 days during which time she was under percent compliant. Average usage of 7 hours and 52 minutes with a pressure of 21/17 and the rate of 12. Residual AHI at 1 per hour and leak at times high with the 95th percentile at 75.6  L/m.   I first met her on 05/13/2014 at the request of her psychiatrist, at which time the patient reported difficulty with sleep maintenance with frequent nighttime awakenings, snoring, nocturia, and witnessed apneas. I asked her to return for a sleep study. She had a split-night sleep study on 06/07/2014 and went over her test results with her in detail today. Her baseline sleep efficiency was 62.9% with a latency to sleep of 15.5 minutes and wake after sleep onset of 21 minutes with moderate sleep fragmentation noted. She had an increased percentage of stage I sleep, an increased percentage of slow-wave sleep, and absence of REM sleep. No EKG changes were noted and no significant PLMS. She had loud snoring. She slept on her back only. Her total AHI was 61 per hour. Baseline oxygen saturation was only 88%, nadir was 80%. She was therefore started on CPAP therapy. Sleep efficiency was improved. She had an  increased percentage of REM sleep at 39.3%. Average oxygen saturation was 89%, nadir was 78%. The patient was started on CPAP. She was first tried on a nasal mask but had significant mouth opening. She was titrated from 5-15 cm. She had significant residual sleep disordered breathing and therefore was switched to BiPAP therapy. She was titrated from 16/12 cm to 20/16 cm. She had central apneas. She was switched to BiPAP ST. She was titrated to a final pressure of 21/17 with a rate of 12. Residual AHI was 0 on the final setting with her O2 nadir of 89% on the final setting. Based on her test results I tarted her on BiPAP ST at home.   I reviewed her BiPAP compliance data from 06/21/2014 through 07/20/2014 which is a total of 30 days during which time she was under percent compliant. Percent used days greater than 4 hours was 100%, indicating superb compliance. Average usage of 8 hours and 17 minutes. Pressure at 21/17 with a rate of 12. Residual AHI 1 per hour, leak was high.   She works from home for [employer].  She has had sleep walking and sleep eating into adulthood. She has tried Nyquil, Xanax, benadryl. She used to get morning HAs, but these improved since she starting using a custom made bite guard for bruxism. She has had nocturia x 2-3 times, but also nocturnal incontinence. She denies RLS symptoms and is not known to kick in her sleep.   Her typical bedtime is reported to be around MN and usual wake time is around 7:30 AM. Sleep onset typically occurs within minutes after reading on her Kindle. She reports feeling poorly rested upon awakening. She wakes up on an average 3 times in the middle of the night and has to go to the bathroom 2-3 times on a typical night.   She reports excessive daytime somnolence (EDS) and Her Epworth Sleepiness Score (ESS) is 6/24 today. She has not fallen asleep while driving. The patient has been taking a nap during lunch time at times.   She has been known to snore for the past many years. Snoring is reportedly marked, and associated with choking sounds and witnessed apneas. The patient admits to a sense of choking or strangling feeling. There is report of nighttime reflux, and she has to sleep on 2 pillows. She prefers to sleep on the R side. There is no nighttime cough experienced. The patient has not noted any RLS symptoms and is not known to kick while asleep or before falling asleep. There is family history of insomnia, but no obvious RLS or OSA.   She is a restless sleeper and in the morning, the bed is quite disheveled.    She denies cataplexy, sleep paralysis, hypnagogic or hypnopompic hallucinations, or sleep attacks. She does report any vivid dreams, some nightmares, no dream enactments, but parasomnias, such as sleep eating, and sleep walking. The patient has not had a sleep study or a home sleep test.   She consumes 5-6 caffeinated beverages per day, usually in the form of coffee and sodas, as late as dinner. Her bedroom is usually dark and cool. There is no TV in the  bedroom.     Her Past Medical History Is Significant For: Past Medical History:  Diagnosis Date  . Anxiety   . Asthma   . Depression   . Diabetes mellitus without complication ([place])   . GERD (gastroesophageal reflux disease)   . Hernia   . Hypertension   .  IBS (irritable bowel syndrome)   . Migraine   . Morbid obesity ([place])   . Polycystic ovarian disease   . Rosacea   . Sleep apnea     Her Past Surgical History Is Significant For: Past Surgical History:  Procedure Laterality Date  . ADENOIDECTOMY    . BACK SURGERY    . HERNIA REPAIR    . TONSILECTOMY, ADENOIDECTOMY, BILATERAL MYRINGOTOMY AND TUBES    . UMBILICAL HERNIA REPAIR    . ventrical hernia repair      Her Family History Is Significant For: Family History  Problem Relation Age of Onset  . Heart disease Mother   . Cancer Mother   . Stroke Mother   . Diabetes Mother   . Hypertension Mother   . Heart disease Father   . Hypertension Brother   . High Cholesterol Brother   . Depression Brother     Her Social History Is Significant For: Social History   Socioeconomic History  . Marital status: Legally Separated    Spouse name: [name]  . Number of children: 0  . Years of education: [employer].  . Highest education level: None  Social Needs  . Financial resource strain: None  . Food insecurity - worry: None  . Food insecurity - inability: None  . Transportation needs - medical: None  . Transportation needs - non-medical: None  Occupational History    Employer: [employer]  Tobacco Use  . Smoking status: Former Smoker    Packs/day: 1.00    Years: 20.00    Pack years: 20.00    Types: Cigarettes    Last attempt to quit: 09/02/2006    Years since quitting: 11.1  . Smokeless tobacco: Never Used  Substance and Sexual Activity  . Alcohol use: Yes    Alcohol/week: 0.0 oz    Comment: occasionally  . Drug use: No  . Sexual activity: Yes    Partners: Female  Other Topics Concern  . None  Social History  Narrative   Patient lives at home with spouse.   Caffeine Use: 5-6 cups of coffee daily    Her Allergies Are:  Allergies  Allergen Reactions  . Chamomile Swelling  . Contrast Media [Iodinated Diagnostic Agents]     Renal failure   . Erythromycin   . Penicillins   :   Her Current Medications Are:  Outpatient Encounter Medications as of 10/23/2017  Medication Sig  . Albuterol (VENTOLIN IN) Inhale 2 Inhalers into the lungs 3 (three) times daily as needed.    . ALPRAZolam (XANAX) 0.5 MG tablet Take 0.5 mg by mouth 2 (two) times daily as needed.    Marland Kitchen aspirin 81 MG chewable tablet Chew 81 mg by mouth daily.  . Cholecalciferol (VITAMIN D PO) Take 5,000 mg by mouth daily.  . cyanocobalamin 500 MCG tablet Take 500 mcg by mouth daily.  Mariane Baumgarten Sodium (COLACE PO) Take 1 tablet by mouth daily.  . DULoxetine (CYMBALTA) 60 MG capsule Take 60 mg by mouth daily.  . famotidine (PEPCID) 20 MG tablet Take 20 mg by mouth daily.   . fexofenadine (ALLEGRA) 180 MG tablet Take 180 mg by mouth daily.  Marland Kitchen FLUVIRIN SUSP   . hydrochlorothiazide (HYDRODIURIL) 25 MG tablet Take 25 mg by mouth daily.  . metFORMIN (GLUMETZA) 500 MG (MOD) 24 hr tablet Take 1 tablet (500 mg total) by mouth 2 (two) times daily with a meal. (Patient taking differently: Take by mouth. 533m in am and 10043min pm)  .  Prenatal Vit-Fe Fumarate-FA (M-VIT PO) Take by mouth.    . spironolactone (ALDACTONE) 50 MG tablet TAKE 1 TABLET BY MOUTH  DAILY. TAKE WITH BREAKFAST.  . [DISCONTINUED] DULoxetine (CYMBALTA) 20 MG capsule Take 20 mg by mouth daily.  . [DISCONTINUED] hydrochlorothiazide (HYDRODIURIL) 12.5 MG tablet Take 25 mg by mouth daily.    No facility-administered encounter medications on file as of 10/23/2017.   :  Review of Systems:  Out of a complete 14 point review of systems, all are reviewed and negative with the exception of these symptoms as listed below: Review of Systems  Neurological:       Pt presents today to  discuss her bipap. Pt reports that her bipap is going well. Pt has had an increase in stress in her life due to family illnesses.    Objective:  Neurological Exam  Physical Exam Physical Examination:   Vitals:   10/23/17 1141  BP: (!) 161/103  Pulse: (!) 112    General Examination: The patient is a very pleasant 47 y.o. female in no acute distress. She appears well-developed and well-nourished and well groomed.   HEENT: Normocephalic, atraumatic, pupils are equal, round and reactive to light and accommodation. Extraocular tracking is good without limitation to gaze excursion or nystagmus noted. Normal smooth pursuit is noted. Hearing is grossly intact. Face is symmetric with normal facial animation and normal facial sensation. Speech is clear with no dysarthria noted. There is no hypophonia. There is no lip, neck/head, jaw or voice tremor. Neck is supple with full range of passive and active motion. There are no carotid bruits on auscultation. Oropharynx exam reveals: mild to moderate mouth dryness, adequate dental hygiene and mild to moderate airway crowding. Tongue protrudes centrally and palate elevates symmetrically. Tonsils are absent.   Chest: Clear to auscultation without wheezing, rhonchi or crackles noted.  Heart: S1+S2+0, regular and normal without murmurs, rubs or gallops noted.   Abdomen: Soft, non-tender and non-distended with normal bowel sounds appreciated on auscultation.  Extremities: There is trace edema in the distal lower extremities bilaterally.   Skin: Warm and dry, purplish discoloration of both feet particularly the toes area and forefoot areas, also reduced temperature in those areas. Normal appearing hands and fingers.   Musculoskeletal: exam reveals no obvious joint deformities, tenderness or joint swelling or erythema.   Neurologically:  Mental status: The patient is awake, alert and oriented in all 4 spheres. Her immediate and remote memory,  attention, language skills and fund of knowledge are appropriate. There is no evidence of aphasia, agnosia, apraxia or anomia. Speech is clear with normal prosody and enunciation. Thought process is linear. Mood is normal and affect is normal.  Cranial nerves II - XII are as described above under HEENT exam.  Motor exam: Normal bulk, strength and tone is noted. There is no drift, tremor or rebound. Reflexes are 1+ throughout. Fine motor skills and coordination: grossly intact.  Cerebellar testing: No dysmetria or intention tremor.  Sensory exam: intact to light touch in the upper and lower extremities.  Gait, station and balance: She stands easily. No veering to one side is noted. No leaning to one side is noted. Posture is age-appropriate and stance is narrow based. Gait shows normal stride length and normal pace. No problems turning are noted.  Assessment and Plan:   In summary, Kelly Vargas is a very pleasant 47 year old female with an underlying medical history of polycystic ovarian syndrome, IBS, hypertension, reflux disease, morbid obesity, depression and anxiety,  status post ventral hernia and umbilical hernia repairs, lumbar laminectomy, tonsillectomy and adenoidectomy, who presents for follow up consultation of her severe sleep disordered breathing with evidence of severe central apneas with CPAP. She has done well with BiPAP ST. She has been fully compliant with treatment. We reduced h,er pressure in 2016, due to excess air leak from the mask. She is still experiencing a lot of air leakage from the mask, but leak is overall better than 70 and 65 lpm before. She has been using a full facemask. She uses a chinstrap as well. She has had increased stress lately. She has had an elevated blood pressure today but improved by the end of the visit. She has had more headaches lately. We talked about migraine headaches and headache triggers quite a bit today as well. Physical exam is overall stable, but  weight has been fluctuating. She is commended for her BiPAP ST treatment adherence but is reminded to stay better hydrated and better rested, she may need blood pressure management. She is encouraged to establish care with her new GYN. She is also advised to follow-up with primary care and consider seeing a vascular specialist for her foot discoloration and pain at night with reddish discoloration noted at night. Of note, she had a split-night sleep study in 06/07/2014. She has a history of severe central apnea while on CPAP. She was tried on BiPAP, but did best on BiPAP ST. Residual AHI is less than 1 per hour. We reduced her pressure to 15/11 in 2017, rate at 12/m. She is advised to follow-up in one year. I answered all her questions today and she was in agreement. I spent 20 minutes in total face-to-face time with the patient, more than 50% of which was spent in counseling and coordination of care, reviewing test results, reviewing medication and discussing or reviewing the diagnosis of OSA, CSA, its prognosis and treatment options. Pertinent laboratory and imaging test results that were available during this visit with the patient were reviewed by me and considered in my medical decision making (see chart for details).

## 2018-09-09 ENCOUNTER — Inpatient Hospital Stay (HOSPITAL_COMMUNITY)
Admission: EM | Admit: 2018-09-09 | Discharge: 2018-09-16 | DRG: 394 | Disposition: A | Discharge status: Home / Self Care | Payer: 59 | Attending: Family Medicine | Admitting: Family Medicine

## 2018-09-09 ENCOUNTER — Emergency Department (HOSPITAL_COMMUNITY): Payer: 59

## 2018-09-09 ENCOUNTER — Encounter (HOSPITAL_COMMUNITY): Payer: Self-pay | Admitting: Emergency Medicine

## 2018-09-09 DIAGNOSIS — E876 Hypokalemia: Secondary | ICD-10-CM | POA: Diagnosis present

## 2018-09-09 DIAGNOSIS — K436 Other and unspecified ventral hernia with obstruction, without gangrene: Secondary | ICD-10-CM | POA: Diagnosis not present

## 2018-09-09 DIAGNOSIS — E119 Type 2 diabetes mellitus without complications: Secondary | ICD-10-CM | POA: Diagnosis present

## 2018-09-09 DIAGNOSIS — Z7984 Long term (current) use of oral hypoglycemic drugs: Secondary | ICD-10-CM | POA: Diagnosis not present

## 2018-09-09 DIAGNOSIS — G4733 Obstructive sleep apnea (adult) (pediatric): Secondary | ICD-10-CM | POA: Diagnosis present

## 2018-09-09 DIAGNOSIS — K430 Incisional hernia with obstruction, without gangrene: Secondary | ICD-10-CM | POA: Diagnosis not present

## 2018-09-09 DIAGNOSIS — F32A Depression, unspecified: Secondary | ICD-10-CM

## 2018-09-09 DIAGNOSIS — Z7951 Long term (current) use of inhaled steroids: Secondary | ICD-10-CM

## 2018-09-09 DIAGNOSIS — E1159 Type 2 diabetes mellitus with other circulatory complications: Secondary | ICD-10-CM | POA: Diagnosis not present

## 2018-09-09 DIAGNOSIS — Z880 Allergy status to penicillin: Secondary | ICD-10-CM | POA: Diagnosis not present

## 2018-09-09 DIAGNOSIS — R112 Nausea with vomiting, unspecified: Secondary | ICD-10-CM

## 2018-09-09 DIAGNOSIS — F419 Anxiety disorder, unspecified: Secondary | ICD-10-CM | POA: Diagnosis present

## 2018-09-09 DIAGNOSIS — Z6841 Body Mass Index (BMI) 40.0 and over, adult: Secondary | ICD-10-CM

## 2018-09-09 DIAGNOSIS — I10 Essential (primary) hypertension: Secondary | ICD-10-CM

## 2018-09-09 DIAGNOSIS — E282 Polycystic ovarian syndrome: Secondary | ICD-10-CM | POA: Diagnosis present

## 2018-09-09 DIAGNOSIS — Z87891 Personal history of nicotine dependence: Secondary | ICD-10-CM | POA: Diagnosis not present

## 2018-09-09 DIAGNOSIS — F329 Major depressive disorder, single episode, unspecified: Secondary | ICD-10-CM | POA: Diagnosis present

## 2018-09-09 DIAGNOSIS — K56609 Unspecified intestinal obstruction, unspecified as to partial versus complete obstruction: Secondary | ICD-10-CM | POA: Diagnosis not present

## 2018-09-09 DIAGNOSIS — Z79899 Other long term (current) drug therapy: Secondary | ICD-10-CM

## 2018-09-09 DIAGNOSIS — Z888 Allergy status to other drugs, medicaments and biological substances status: Secondary | ICD-10-CM

## 2018-09-09 DIAGNOSIS — K429 Umbilical hernia without obstruction or gangrene: Secondary | ICD-10-CM | POA: Diagnosis present

## 2018-09-09 DIAGNOSIS — K219 Gastro-esophageal reflux disease without esophagitis: Secondary | ICD-10-CM | POA: Diagnosis present

## 2018-09-09 DIAGNOSIS — J45909 Unspecified asthma, uncomplicated: Secondary | ICD-10-CM | POA: Diagnosis present

## 2018-09-09 DIAGNOSIS — I152 Hypertension secondary to endocrine disorders: Secondary | ICD-10-CM | POA: Diagnosis present

## 2018-09-09 DIAGNOSIS — Z7982 Long term (current) use of aspirin: Secondary | ICD-10-CM | POA: Diagnosis not present

## 2018-09-09 DIAGNOSIS — R197 Diarrhea, unspecified: Secondary | ICD-10-CM | POA: Diagnosis not present

## 2018-09-09 DIAGNOSIS — Z885 Allergy status to narcotic agent status: Secondary | ICD-10-CM | POA: Diagnosis not present

## 2018-09-09 DIAGNOSIS — R1084 Generalized abdominal pain: Secondary | ICD-10-CM | POA: Diagnosis not present

## 2018-09-09 LAB — URINALYSIS, ROUTINE W REFLEX MICROSCOPIC
Bilirubin Urine: NEGATIVE
Glucose, UA: NEGATIVE mg/dL
Ketones, ur: 20 mg/dL — AB
NITRITE: NEGATIVE
Protein, ur: 100 mg/dL — AB
SPECIFIC GRAVITY, URINE: 1.036 — AB (ref 1.005–1.030)
pH: 5 (ref 5.0–8.0)

## 2018-09-09 LAB — CBC
HCT: 48.1 % — ABNORMAL HIGH (ref 36.0–46.0)
Hemoglobin: 16 g/dL — ABNORMAL HIGH (ref 12.0–15.0)
MCH: 30.4 pg (ref 26.0–34.0)
MCHC: 33.3 g/dL (ref 30.0–36.0)
MCV: 91.3 fL (ref 80.0–100.0)
NRBC: 0 % (ref 0.0–0.2)
Platelets: 291 10*3/uL (ref 150–400)
RBC: 5.27 MIL/uL — ABNORMAL HIGH (ref 3.87–5.11)
RDW: 13.2 % (ref 11.5–15.5)
WBC: 12.3 10*3/uL — ABNORMAL HIGH (ref 4.0–10.5)

## 2018-09-09 LAB — I-STAT BETA HCG BLOOD, ED (MC, WL, AP ONLY): I-stat hCG, quantitative: <5 m[IU]/mL (ref <5)

## 2018-09-09 LAB — COMPREHENSIVE METABOLIC PANEL
ALT: 33 U/L (ref 0–44)
AST: 34 U/L (ref 15–41)
Albumin: 4.4 g/dL (ref 3.5–5.0)
Alkaline Phosphatase: 42 U/L (ref 38–126)
Anion gap: 13 (ref 5–15)
BUN: 17 mg/dL (ref 6–20)
CO2: 25 mmol/L (ref 22–32)
Calcium: 9.5 mg/dL (ref 8.9–10.3)
Chloride: 101 mmol/L (ref 98–111)
Creatinine, Ser: 0.8 mg/dL (ref 0.44–1.00)
Glucose, Bld: 138 mg/dL — ABNORMAL HIGH (ref 70–99)
Potassium: 3.2 mmol/L — ABNORMAL LOW (ref 3.5–5.1)
Sodium: 139 mmol/L (ref 135–145)
Total Bilirubin: 0.9 mg/dL (ref 0.3–1.2)
Total Protein: 7.7 g/dL (ref 6.5–8.1)

## 2018-09-09 LAB — LIPASE, BLOOD: Lipase: 33 U/L (ref 11–51)

## 2018-09-09 MED ORDER — SODIUM CHLORIDE 0.9 % IV BOLUS
2000.0000 mL | Freq: Once | INTRAVENOUS | Status: AC
  Administered 2018-09-09: 1000 mL via INTRAVENOUS

## 2018-09-09 MED ORDER — INSULIN ASPART 100 UNIT/ML ~~LOC~~ SOLN
0.0000 [IU] | SUBCUTANEOUS | Status: DC
  Administered 2018-09-10 – 2018-09-13 (×16): 1 [IU] via SUBCUTANEOUS

## 2018-09-09 MED ORDER — ONDANSETRON HCL 4 MG/2ML IJ SOLN
4.0000 mg | Freq: Four times a day (QID) | INTRAMUSCULAR | Status: DC | PRN
  Administered 2018-09-10 – 2018-09-11 (×4): 4 mg via INTRAVENOUS
  Filled 2018-09-09 (×4): qty 2

## 2018-09-09 MED ORDER — LORAZEPAM 2 MG/ML IJ SOLN
1.0000 mg | Freq: Once | INTRAMUSCULAR | Status: AC
  Administered 2018-09-10: 1 mg via INTRAVENOUS
  Filled 2018-09-09: qty 1

## 2018-09-09 MED ORDER — PROMETHAZINE HCL 25 MG/ML IJ SOLN
25.0000 mg | Freq: Once | INTRAMUSCULAR | Status: AC
  Administered 2018-09-09: 25 mg via INTRAVENOUS
  Filled 2018-09-09: qty 1

## 2018-09-09 MED ORDER — IOPAMIDOL (ISOVUE-300) INJECTION 61%
INTRAVENOUS | Status: AC
  Filled 2018-09-09: qty 100

## 2018-09-09 MED ORDER — ACETAMINOPHEN 325 MG PO TABS
650.0000 mg | ORAL_TABLET | Freq: Four times a day (QID) | ORAL | Status: DC | PRN
  Administered 2018-09-15 – 2018-09-16 (×3): 650 mg via ORAL
  Filled 2018-09-09 (×3): qty 2

## 2018-09-09 MED ORDER — SODIUM CHLORIDE (PF) 0.9 % IJ SOLN
INTRAMUSCULAR | Status: AC
  Filled 2018-09-09: qty 50

## 2018-09-09 MED ORDER — ONDANSETRON HCL 4 MG/2ML IJ SOLN
4.0000 mg | Freq: Once | INTRAMUSCULAR | Status: AC | PRN
  Administered 2018-09-09: 4 mg via INTRAVENOUS
  Filled 2018-09-09: qty 2

## 2018-09-09 MED ORDER — HYDROMORPHONE HCL 1 MG/ML IJ SOLN
0.5000 mg | INTRAMUSCULAR | Status: DC | PRN
  Administered 2018-09-10 – 2018-09-15 (×16): 0.5 mg via INTRAVENOUS
  Filled 2018-09-09 (×12): qty 0.5
  Filled 2018-09-09: qty 1
  Filled 2018-09-09 (×4): qty 0.5

## 2018-09-09 MED ORDER — ONDANSETRON HCL 4 MG PO TABS: 4.0000 mg | ORAL_TABLET | Freq: Four times a day (QID) | ORAL | Status: DC | PRN

## 2018-09-09 MED ORDER — SODIUM CHLORIDE 0.9 % IV SOLN
INTRAVENOUS | Status: DC
  Administered 2018-09-10 – 2018-09-12 (×6): via INTRAVENOUS

## 2018-09-09 MED ORDER — IOPAMIDOL (ISOVUE-300) INJECTION 61%
100.0000 mL | Freq: Once | INTRAVENOUS | Status: AC | PRN
  Administered 2018-09-09: 100 mL via INTRAVENOUS

## 2018-09-09 MED ORDER — ACETAMINOPHEN 650 MG RE SUPP: 650.0000 mg | Freq: Four times a day (QID) | RECTAL | Status: DC | PRN

## 2018-09-09 NOTE — ED Notes (Signed)
ED TO INPATIENT HANDOFF REPORT  Name/Age/Gender Kelly Vargas 48 y.o. female  Code Status    Code Status Orders  (From admission, onward)         Start     Ordered   09/09/18 2330  Full code  Continuous     09/09/18 2331        Code Status History    This patient has a current code status but no historical code status.      Home/SNF/Other Home  Chief Complaint N-V-D  Level of Care/Admitting Diagnosis ED Disposition    ED Disposition Condition Comment   Admit  Hospital Area: [facility] [[id number]]  Level of Care: Med-Surg [16]  Diagnosis: SBO (small bowel obstruction) [facility]) [[id number]]  Admitting Physician: Wyvonnia Dusky  Attending Physician: Hillary Bow [phone]  Estimated length of stay: past midnight tomorrow  Certification:: I certify this patient will need inpatient services for at least 2 midnights  PT Class (Do Not Modify): Inpatient [101]  PT Acc Code (Do Not Modify): Private [1]       Medical History Past Medical History:  Diagnosis Date  . Anxiety   . Asthma   . Depression   . Diabetes mellitus without complication (HCC)   . GERD (gastroesophageal reflux disease)   . Hernia   . Hypertension   . IBS (irritable bowel syndrome)   . Migraine   . Morbid obesity (HCC)   . Polycystic ovarian disease   . Rosacea   . Sleep apnea     Allergies Allergies  Allergen Reactions  . Chamomile Swelling  . Levomenol Swelling  . Penicillins Hives  . Erythromycin   . Morphine Nausea And Vomiting    Pt states morphine makes me have nausea and vomiting.    IV Location/Drains/Wounds Patient Lines/Drains/Airways Status   Active Line/Drains/Airways    Name:   Placement date:   Placement time:   Site:   Days:   Peripheral IV 09/09/18 Left Antecubital   09/09/18    1600    Antecubital   less than 1          Labs/Imaging Results for orders placed or performed during the hospital encounter of 09/09/18 (from the past 48  hour(s))  Urinalysis, Routine w reflex microscopic     Status: Abnormal   Collection Time: 09/09/18  4:23 PM  Result Value Ref Range   Color, Urine AMBER (A) YELLOW    Comment: BIOCHEMICALS MAY BE AFFECTED BY COLOR   APPearance HAZY (A) CLEAR   Specific Gravity, Urine 1.036 (H) 1.005 - 1.030   pH 5.0 5.0 - 8.0   Glucose, UA NEGATIVE NEGATIVE mg/dL   Hgb urine dipstick SMALL (A) NEGATIVE   Bilirubin Urine NEGATIVE NEGATIVE   Ketones, ur 20 (A) NEGATIVE mg/dL   Protein, ur 098 (A) NEGATIVE mg/dL   Nitrite NEGATIVE NEGATIVE   Leukocytes, UA SMALL (A) NEGATIVE   RBC / HPF 6-10 0 - 5 RBC/hpf   WBC, UA 21-50 0 - 5 WBC/hpf   Bacteria, UA MANY (A) NONE SEEN   Squamous Epithelial / LPF 11-20 0 - 5   Mucus PRESENT     Comment: Performed at [facility], [address], [place], [place] [address]  Lipase, blood     Status: None   Collection Time: 09/09/18  5:02 PM  Result Value Ref Range   Lipase 33 11 - 51 U/L    Comment: Performed at [facility], [address] [place], [place] [address]  Comprehensive metabolic panel     Status: Abnormal   Collection Time: 09/09/18  5:02 PM  Result Value Ref Range   Sodium 139 135 - 145 mmol/L   Potassium 3.2 (L) 3.5 - 5.1 mmol/L   Chloride 101 98 - 111 mmol/L   CO2 25 22 - 32 mmol/L   Glucose, Bld 138 (H) 70 - 99 mg/dL   BUN 17 6 - 20 mg/dL   Creatinine, Ser 6.960.80 0.44 - 1.00 mg/dL   Calcium 9.5 8.9 - 29.510.3 mg/dL   Total Protein 7.7 6.5 - 8.1 g/dL   Albumin 4.4 3.5 - 5.0 g/dL   AST 34 15 - 41 U/L   ALT 33 0 - 44 U/L   Alkaline Phosphatase 42 38 - 126 U/L   Total Bilirubin 0.9 0.3 - 1.2 mg/dL   GFR calc non Af Amer >60 >60 mL/min   GFR calc Af Amer >60 >60 mL/min   Anion gap 13 5 - 15    Comment: Performed at [facility], [address] [place], [place] [address]  CBC     Status: Abnormal   Collection Time: 09/09/18  5:02 PM  Result Value Ref Range   WBC 12.3 (H) 4.0 - 10.5 K/uL    RBC 5.27 (H) 3.87 - 5.11 MIL/uL   Hemoglobin 16.0 (H) 12.0 - 15.0 g/dL   HCT 24.448.1 (H) 01.036.0 - 27.246.0 %   MCV 91.3 80.0 - 100.0 fL   MCH 30.4 26.0 - 34.0 pg   MCHC 33.3 30.0 - 36.0 g/dL   RDW 53.613.2 64.411.5 - 03.415.5 %   Platelets 291 150 - 400 K/uL   nRBC 0.0 0.0 - 0.2 %    Comment: Performed at [facility], [address] [place], [place] [address]  I-Stat beta hCG blood, ED     Status: None   Collection Time: 09/09/18  5:07 PM  Result Value Ref Range   I-stat hCG, quantitative <5.0 <5 mIU/mL   Comment 3            Comment:   GEST. AGE      CONC.  (mIU/mL)   <=1 WEEK        5 - 50     2 WEEKS       50 - 500     3 WEEKS       100 - 10,000     4 WEEKS     1,000 - 30,000        FEMALE AND NON-PREGNANT FEMALE:     LESS THAN 5 mIU/mL    Ct Abdomen Pelvis W Contrast  Result Date: 09/09/2018 CLINICAL DATA:  Abdominal pain with nausea, vomiting and diarrhea EXAM: CT ABDOMEN AND PELVIS WITH CONTRAST TECHNIQUE: Multidetector CT imaging of the abdomen and pelvis was performed using the standard protocol following bolus administration of intravenous contrast. CONTRAST:  100mL ISOVUE-300 IOPAMIDOL (ISOVUE-300) INJECTION 61% COMPARISON:  None. FINDINGS: Lower chest: Normal heart size without pericardial effusion or thickening. Bibasilar dependent atelectasis without effusion, pulmonary consolidation or pneumothorax. No dominant mass. Hepatobiliary: Steatosis of the liver without biliary nor mass. Normal gallbladder free of stones. Pancreas: Normal Spleen: Normal size spleen with adjacent small splenule. Adrenals/Urinary Tract: Normal bilateral adrenal glands and kidneys. No nephrolithiasis nor obstructive uropathy. The urinary bladder is unremarkable for the degree of distention. Stomach/Bowel: Physiologic distention of the stomach with normal small bowel rotation. Mild fluid-filled distention jejunal  loops to 3.5 cm in caliber secondary to a right periumbilical omental fat and small bowel  containing hernia, series 4/75. This is likely contributing to dilatation of small bowel and partial or early SBO. No apparent incarceration. Adjacent to this is another ventral hernia containing small bowel and omental fat. There is fecalized material within the segment of small bowel projecting into the hernia as well as just outside the ventral hernia opening. The transverse dimension of the common hernia orifice is 9.5 cm which than separates into the two separate hernia sacs as above described. No large bowel dilatation or herniation is identified. There is colonic diverticulosis along the descending and sigmoid colon. Vascular/Lymphatic: No significant vascular findings are present. No enlarged abdominal or pelvic lymph nodes. Reproductive: Uterus and bilateral adnexa are unremarkable. Other: No free air or free fluid. Subcutaneous soft tissue induration of the included pannus. Musculoskeletal: Degenerative disc disease L4-5 and L5-S1. Facet arthropathy L3 through S1. IMPRESSION: 1. Mild fluid-filled distention of jejunal loops up to 3.5 cm secondary to a right periumbilical omental fat and small bowel containing hernia. This is likely contributing to dilatation of small bowel. No incarceration is identified. Adjacent to this is another ventral hernia containing small bowel and omental fat with small amount of fecalized material noted. This too is likely contributing to early or partial SBO. 2. Hepatic steatosis. 3. Lower lumbar degenerative disc disease L4-5 and L5-S1. Electronically Signed   By: Tollie Ethavid  Kwon M.D.   On: 09/09/2018 22:41    Pending Labs Unresulted Labs (From admission, onward)    Start     Ordered   09/10/18 0500  CBC  Tomorrow morning,   R     09/09/18 2331   09/10/18 0500  Basic metabolic panel  Tomorrow morning,   R     09/09/18 2331   09/09/18 2330  HIV antibody (Routine Testing)  Once,   R     09/09/18 2331   09/09/18 2114  Urine culture  ONCE - STAT,   STAT     09/09/18 2113           Vitals/Pain Today's Vitals   09/09/18 1627 09/09/18 1833 09/09/18 2300 09/09/18 2312  BP: (!) 151/110 (!) 140/92 (!) 155/91   Pulse: 84 76 66   Resp: 19 19 19    Temp: 97.9 F (36.6 C)     TempSrc: Oral     SpO2: 97% 98% 99%   PainSc:    8     Isolation Precautions No active isolations  Medications Medications  iopamidol (ISOVUE-300) 61 % injection (has no administration in time range)  sodium chloride (PF) 0.9 % injection (has no administration in time range)  0.9 %  sodium chloride infusion (has no administration in time range)  acetaminophen (TYLENOL) tablet 650 mg (has no administration in time range)    Or  acetaminophen (TYLENOL) suppository 650 mg (has no administration in time range)  ondansetron (ZOFRAN) tablet 4 mg (has no administration in time range)    Or  ondansetron (ZOFRAN) injection 4 mg (has no administration in time range)  insulin aspart (novoLOG) injection 0-9 Units (has no administration in time range)  HYDROmorphone (DILAUDID) injection 0.5 mg (has no administration in time range)  LORazepam (ATIVAN) injection 1 mg (has no administration in time range)  ondansetron (ZOFRAN) injection 4 mg (4 mg Intravenous Given 09/09/18 1831)  sodium chloride 0.9 % bolus 2,000 mL (0 mLs Intravenous Stopped 09/09/18 2300)  promethazine (PHENERGAN) injection 25 mg (25  mg Intravenous Given 09/09/18 2143)  iopamidol (ISOVUE-300) 61 % injection 100 mL (100 mLs Intravenous Contrast Given 09/09/18 2149)    Mobility walks with device

## 2018-09-09 NOTE — H&P (Addendum)
History and Physical    Kelly Vargas:[id number] DOB: Apr 29, 1971 DOA: 09/09/2018  PCP: Boneta Lucks, NP  Patient coming from: Home  I have personally briefly reviewed patient's old medical records in [facility]  Chief Complaint: Abd pain, N/V  HPI: Kelly Vargas is a 48 y.o. female with medical history significant of PCOS, DM, HTN.  Patient presents to the ED for evaluation of abd pain, N/V.  Symptoms onset 4 days ago, worsening.  Unable to tolerate anything PO.  Does report episodes of diarrhea.   ED Course: CT reveals PSBO likely due to 2 separate hernias contributing (periumbilical and ventral hernias).   Review of Systems: As per HPI otherwise 10 point review of systems negative.   Past Medical History:  Diagnosis Date  . Anxiety   . Asthma   . Depression   . Diabetes mellitus without complication (HCC)   . GERD (gastroesophageal reflux disease)   . Hernia   . Hypertension   . IBS (irritable bowel syndrome)   . Migraine   . Morbid obesity (HCC)   . Polycystic ovarian disease   . Rosacea   . Sleep apnea     Past Surgical History:  Procedure Laterality Date  . ADENOIDECTOMY    . BACK SURGERY    . HERNIA REPAIR    . TONSILECTOMY, ADENOIDECTOMY, BILATERAL MYRINGOTOMY AND TUBES    . UMBILICAL HERNIA REPAIR    . ventrical hernia repair       reports that she quit smoking about 12 years ago. Her smoking use included cigarettes. She has a 20.00 pack-year smoking history. She has never used smokeless tobacco. She reports current alcohol use. She reports that she does not use drugs.  Allergies  Allergen Reactions  . Chamomile Swelling  . Levomenol Swelling  . Penicillins Hives  . Erythromycin   . Morphine Nausea And Vomiting    Pt states morphine makes me have nausea and vomiting.    Family History  Problem Relation Age of Onset  . Heart disease Mother   . Cancer Mother   . Stroke Mother   . Diabetes Mother   . Hypertension Mother   . Heart  disease Father   . Hypertension Brother   . High Cholesterol Brother   . Depression Brother      Prior to Admission medications   Medication Sig Start Date End Date Taking? Authorizing Provider  acetaminophen (TYLENOL) 500 MG tablet Take 500 mg by mouth daily as needed for mild pain.   Yes [provider]  Albuterol (VENTOLIN IN) Inhale 2 Inhalers into the lungs 3 (three) times daily as needed.     Yes [provider]  ALPRAZolam Prudy Feeler) 0.5 MG tablet Take 0.5 mg by mouth 2 (two) times daily as needed for anxiety.    Yes [provider]  aspirin 81 MG chewable tablet Chew 81 mg by mouth daily.   Yes [provider]  Cholecalciferol (VITAMIN D PO) Take 5,000 mg by mouth daily.   Yes [provider]  cyanocobalamin 500 MCG tablet Take 500 mcg by mouth daily.   Yes [provider]  DULoxetine (CYMBALTA) 60 MG capsule Take 60 mg by mouth daily.   Yes [provider]  escitalopram (LEXAPRO) 10 MG tablet Take 10 mg by mouth daily.   Yes [provider]  famotidine (PEPCID) 20 MG tablet Take 20 mg by mouth daily.    Yes [provider]  fexofenadine (ALLEGRA) 180 MG tablet  Take 180 mg by mouth daily.   Yes [provider]  hydrochlorothiazide (HYDRODIURIL) 25 MG tablet Take 25 mg by mouth daily.   Yes [provider]  metFORMIN (GLUMETZA) 500 MG (MOD) 24 hr tablet Take 1 tablet (500 mg total) by mouth 2 (two) times daily with a meal. Patient taking differently: Take 1,000 mg by mouth.  07/05/14  Yes Antionette CharJackson-Moore, Lisa, MD  Prenatal Vit-Fe Fumarate-FA (M-VIT PO) Take by mouth.     Yes [provider]  spironolactone (ALDACTONE) 50 MG tablet TAKE 1 TABLET BY MOUTH  DAILY. TAKE WITH BREAKFAST. Patient taking differently: Take 50 mg by mouth daily.  07/04/17  Yes Roe Coombsenney, Rachelle A, CNM    Physical Exam: Vitals:   09/09/18 1627 09/09/18 1833 09/09/18 2300  BP: (!) 151/110 (!) 140/92 (!)  155/91  Pulse: 84 76 66  Resp: 19 19 19   Temp: 97.9 F (36.6 C)    TempSrc: Oral    SpO2: 97% 98% 99%    Constitutional: NAD, calm, comfortable Eyes: PERRL, lids and conjunctivae normal ENMT: Mucous membranes are moist. Posterior pharynx clear of any exudate or lesions.Normal dentition.  Neck: normal, supple, no masses, no thyromegaly Respiratory: clear to auscultation bilaterally, no wheezing, no crackles. Normal respiratory effort. No accessory muscle use.  Cardiovascular: Regular rate and rhythm, no murmurs / rubs / gallops. No extremity edema. 2+ pedal pulses. No carotid bruits.  Abdomen: no tenderness, no masses palpated. No hepatosplenomegaly. Bowel sounds positive.  Musculoskeletal: no clubbing / cyanosis. No joint deformity upper and lower extremities. Good ROM, no contractures. Normal muscle tone.  Skin: no rashes, lesions, ulcers. No induration Neurologic: CN 2-12 grossly intact. Sensation intact, DTR normal. Strength 5/5 in all 4.  Psychiatric: Normal judgment and insight. Alert and oriented x 3. Normal mood.    Labs on Admission: I have personally reviewed following labs and imaging studies  CBC: Recent Labs  Lab 09/09/18 1702  WBC 12.3*  HGB 16.0*  HCT 48.1*  MCV 91.3  PLT 291   Basic Metabolic Panel: Recent Labs  Lab 09/09/18 1702  NA 139  K 3.2*  CL 101  CO2 25  GLUCOSE 138*  BUN 17  CREATININE 0.80  CALCIUM 9.5   GFR: CrCl cannot be calculated (Unknown ideal weight.). Liver Function Tests: Recent Labs  Lab 09/09/18 1702  AST 34  ALT 33  ALKPHOS 42  BILITOT 0.9  PROT 7.7  ALBUMIN 4.4   Recent Labs  Lab 09/09/18 1702  LIPASE 33   No results for input(s): AMMONIA in the last 168 hours. Coagulation Profile: No results for input(s): INR, PROTIME in the last 168 hours. Cardiac Enzymes: No results for input(s): CKTOTAL, CKMB, CKMBINDEX, TROPONINI in the last 168 hours. BNP (last 3 results) No results for input(s): PROBNP in the last 8760  hours. HbA1C: No results for input(s): HGBA1C in the last 72 hours. CBG: No results for input(s): GLUCAP in the last 168 hours. Lipid Profile: No results for input(s): CHOL, HDL, LDLCALC, TRIG, CHOLHDL, LDLDIRECT in the last 72 hours. Thyroid Function Tests: No results for input(s): TSH, T4TOTAL, FREET4, T3FREE, THYROIDAB in the last 72 hours. Anemia Panel: No results for input(s): VITAMINB12, FOLATE, FERRITIN, TIBC, IRON, RETICCTPCT in the last 72 hours. Urine analysis:    Component Value Date/Time   COLORURINE AMBER (A) 09/09/2018 1623   APPEARANCEUR HAZY (A) 09/09/2018 1623   LABSPEC 1.036 (H) 09/09/2018 1623   PHURINE 5.0 09/09/2018 1623   GLUCOSEU NEGATIVE 09/09/2018 1623  HGBUR SMALL (A) 09/09/2018 1623   BILIRUBINUR NEGATIVE 09/09/2018 1623   KETONESUR 20 (A) 09/09/2018 1623   PROTEINUR 100 (A) 09/09/2018 1623   UROBILINOGEN 0.2 03/20/2011 1139   NITRITE NEGATIVE 09/09/2018 1623   LEUKOCYTESUR SMALL (A) 09/09/2018 1623    Radiological Exams on Admission: Ct Abdomen Pelvis W Contrast  Result Date: 09/09/2018 CLINICAL DATA:  Abdominal pain with nausea, vomiting and diarrhea EXAM: CT ABDOMEN AND PELVIS WITH CONTRAST TECHNIQUE: Multidetector CT imaging of the abdomen and pelvis was performed using the standard protocol following bolus administration of intravenous contrast. CONTRAST:  100mL ISOVUE-300 IOPAMIDOL (ISOVUE-300) INJECTION 61% COMPARISON:  None. FINDINGS: Lower chest: Normal heart size without pericardial effusion or thickening. Bibasilar dependent atelectasis without effusion, pulmonary consolidation or pneumothorax. No dominant mass. Hepatobiliary: Steatosis of the liver without biliary nor mass. Normal gallbladder free of stones. Pancreas: Normal Spleen: Normal size spleen with adjacent small splenule. Adrenals/Urinary Tract: Normal bilateral adrenal glands and kidneys. No nephrolithiasis nor obstructive uropathy. The urinary bladder is unremarkable for the degree of  distention. Stomach/Bowel: Physiologic distention of the stomach with normal small bowel rotation. Mild fluid-filled distention jejunal loops to 3.5 cm in caliber secondary to a right periumbilical omental fat and small bowel containing hernia, series 4/75. This is likely contributing to dilatation of small bowel and partial or early SBO. No apparent incarceration. Adjacent to this is another ventral hernia containing small bowel and omental fat. There is fecalized material within the segment of small bowel projecting into the hernia as well as just outside the ventral hernia opening. The transverse dimension of the common hernia orifice is 9.5 cm which than separates into the two separate hernia sacs as above described. No large bowel dilatation or herniation is identified. There is colonic diverticulosis along the descending and sigmoid colon. Vascular/Lymphatic: No significant vascular findings are present. No enlarged abdominal or pelvic lymph nodes. Reproductive: Uterus and bilateral adnexa are unremarkable. Other: No free air or free fluid. Subcutaneous soft tissue induration of the included pannus. Musculoskeletal: Degenerative disc disease L4-5 and L5-S1. Facet arthropathy L3 through S1. IMPRESSION: 1. Mild fluid-filled distention of jejunal loops up to 3.5 cm secondary to a right periumbilical omental fat and small bowel containing hernia. This is likely contributing to dilatation of small bowel. No incarceration is identified. Adjacent to this is another ventral hernia containing small bowel and omental fat with small amount of fecalized material noted. This too is likely contributing to early or partial SBO. 2. Hepatic steatosis. 3. Lower lumbar degenerative disc disease L4-5 and L5-S1. Electronically Signed   By: Tollie Ethavid  Kwon M.D.   On: 09/09/2018 22:41    EKG: Independently reviewed.  Assessment/Plan Principal Problem:   SBO (small bowel obstruction) (HCC) Active Problems:   High blood pressure  associated with diabetes (HCC)   Periumbilical hernia   Ventral hernia    1. PSBO - associated with not 1 but 2 hernias apparently 1. NPO 2. IVF 3. NGT 4. Dilaudid PRN pain 5. Dr. Sheliah HatchKinsinger apparently coming in to evaluate patient tonight 2. DM2 - 1. Hold metformin 2. Sensitive SSI Q4H while NPO 3. HTN - 1. hold home BP meds 2. Use PRN IV if needed 4. OSA - BIPAP QHS  DVT prophylaxis: SCDs - may or may not need OR Code Status: Full Family Communication: Family at bedside Disposition Plan: Home after admit Consults called: EDP spoke with Dr. Sheliah HatchKinsinger Admission status: Admit to inpatient  Severity of Illness: The appropriate patient status for this patient is  INPATIENT. Inpatient status is judged to be reasonable and necessary in order to provide the required intensity of service to ensure the patient's safety. The patient's presenting symptoms, physical exam findings, and initial radiographic and laboratory data in the context of their chronic comorbidities is felt to place them at high risk for further clinical deterioration. Furthermore, it is not anticipated that the patient will be medically stable for discharge from the hospital within 2 midnights of admission. The following factors support the patient status of inpatient.   " The patient's presenting symptoms include N/V, abd pain. " The initial radiographic and laboratory data are worrisome because of SBO / PSBO due to hernias (see CT scan).    * I certify that at the point of admission it is my clinical judgment that the patient will require inpatient hospital care spanning beyond 2 midnights from the point of admission due to high intensity of service, high risk for further deterioration and high frequency of surveillance required.Hillary Bow DO [facility] Pager [phone] Only works nights!  If 7AM-7PM, please contact the primary day team physician taking care of  patient  [email or web address] Password [facility]  09/09/2018, 11:33 PM

## 2018-09-09 NOTE — ED Provider Notes (Signed)
[facility]-EMERGENCY DEPT Provider Note   CSN: [id number] Arrival date & time: 09/09/18  1614     History   Chief Complaint Chief Complaint  Patient presents with  . Abdominal Pain  . Emesis  . Diarrhea    HPI Kelly Vargas is a 48 y.o. female with a past medical history of PCOS, DM, IBS, hypertension, morbid obesity, presents today for evaluation of abdominal pains.  She reports that for the past 4 days she has had worsening pain in her abdomen.  She reports inability to tolerate any p.o. food or fluids.  She had Zofran at home however vomited that up.  She denies any fevers.  She does report that she has had episodes of diarrhea.    She reports that she generally feels unwell.    HPI  Past Medical History:  Diagnosis Date  . Anxiety   . Asthma   . Depression   . Diabetes mellitus without complication (HCC)   . GERD (gastroesophageal reflux disease)   . Hernia   . Hypertension   . IBS (irritable bowel syndrome)   . Migraine   . Morbid obesity (HCC)   . Polycystic ovarian disease   . Rosacea   . Sleep apnea     Patient Active Problem List   Diagnosis Date Noted  . SBO (small bowel obstruction) (HCC) 09/09/2018  . Periumbilical hernia 09/09/2018  . Ventral hernia 09/09/2018  . PCOS (polycystic ovarian syndrome) 03/02/2015  . Morbid obesity (HCC) 03/02/2015  . Hirsutism 03/02/2015  . High blood pressure associated with diabetes (HCC) 03/02/2015    Past Surgical History:  Procedure Laterality Date  . ADENOIDECTOMY    . BACK SURGERY    . HERNIA REPAIR    . TONSILECTOMY, ADENOIDECTOMY, BILATERAL MYRINGOTOMY AND TUBES    . UMBILICAL HERNIA REPAIR    . ventrical hernia repair       OB History   No obstetric history on file.      Home Medications    Prior to Admission medications   Medication Sig Start Date End Date Taking? Authorizing Provider  acetaminophen (TYLENOL) 500 MG tablet Take 500 mg by mouth daily as needed for mild  pain.   Yes [provider]  Albuterol (VENTOLIN IN) Inhale 2 Inhalers into the lungs 3 (three) times daily as needed.     Yes [provider]  ALPRAZolam Prudy Feeler) 0.5 MG tablet Take 0.5 mg by mouth 2 (two) times daily as needed for anxiety.    Yes [provider]  aspirin 81 MG chewable tablet Chew 81 mg by mouth daily.   Yes [provider]  Cholecalciferol (VITAMIN D PO) Take 5,000 mg by mouth daily.   Yes [provider]  cyanocobalamin 500 MCG tablet Take 500 mcg by mouth daily.   Yes [provider]  DULoxetine (CYMBALTA) 60 MG capsule Take 60 mg by mouth daily.   Yes [provider]  escitalopram (LEXAPRO) 10 MG tablet Take 10 mg by mouth daily.   Yes [provider]  famotidine (PEPCID) 20 MG tablet Take 20 mg by mouth daily.    Yes [provider]  fexofenadine (ALLEGRA) 180 MG tablet Take 180 mg by mouth daily.   Yes [provider]  hydrochlorothiazide (HYDRODIURIL) 25 MG tablet Take 25 mg by mouth daily.   Yes [provider]  metFORMIN (GLUMETZA) 500 MG (MOD) 24 hr tablet Take 1 tablet (500 mg total) by mouth 2 (two) times daily  with a meal. Patient taking differently: Take 1,000 mg by mouth.  07/05/14  Yes Antionette Char, MD  Prenatal Vit-Fe Fumarate-FA (M-VIT PO) Take by mouth.     Yes [provider]  spironolactone (ALDACTONE) 50 MG tablet TAKE 1 TABLET BY MOUTH  DAILY. TAKE WITH BREAKFAST. Patient taking differently: Take 50 mg by mouth daily.  07/04/17  Yes Denney, Rodell Perna, CNM    Family History Family History  Problem Relation Age of Onset  . Heart disease Mother   . Cancer Mother   . Stroke Mother   . Diabetes Mother   . Hypertension Mother   . Heart disease Father   . Hypertension Brother   . High Cholesterol Brother   . Depression Brother     Social History Social History   Tobacco Use  . Smoking status: Former Smoker    Packs/day: 1.00     Years: 20.00    Pack years: 20.00    Types: Cigarettes    Last attempt to quit: 09/02/2006    Years since quitting: 12.0  . Smokeless tobacco: Never Used  Substance Use Topics  . Alcohol use: Yes    Alcohol/week: 0.0 standard drinks    Comment: occasionally  . Drug use: No     Allergies   Chamomile; Levomenol; Penicillins; Erythromycin; and Morphine   Review of Systems Review of Systems  Constitutional: Negative for chills and fever.  HENT: Negative for congestion.   Respiratory: Negative for chest tightness and shortness of breath.   Cardiovascular: Negative for chest pain.  Gastrointestinal: Positive for abdominal pain, diarrhea, nausea and vomiting. Negative for abdominal distention and blood in stool.  Musculoskeletal: Negative for back pain.  Skin: Negative for color change.  Neurological: Negative for weakness and headaches.  All other systems reviewed and are negative.    Physical Exam Updated Vital Signs BP (!) 155/91   Pulse 66   Temp 97.9 F (36.6 C) (Oral)   Resp 19   SpO2 99%   Physical Exam Vitals signs and nursing note reviewed.  Constitutional:      Appearance: She is well-developed. She is obese. She is ill-appearing.  HENT:     Head: Normocephalic and atraumatic.     Mouth/Throat:     Mouth: Mucous membranes are moist.  Eyes:     Conjunctiva/sclera: Conjunctivae normal.  Neck:     Musculoskeletal: Neck supple.  Cardiovascular:     Rate and Rhythm: Normal rate and regular rhythm.     Heart sounds: Normal heart sounds. No murmur.  Pulmonary:     Effort: Pulmonary effort is normal. No respiratory distress.     Breath sounds: Normal breath sounds.  Abdominal:     General: Bowel sounds are decreased.     Palpations: Abdomen is soft.     Tenderness: There is generalized abdominal tenderness.     Comments: Exam limited by body habitus.    Skin:    General: Skin is warm and dry.  Neurological:     Mental Status: She is alert.      ED  Treatments / Results  Labs (all labs ordered are listed, but only abnormal results are displayed) Labs Reviewed  COMPREHENSIVE METABOLIC PANEL - Abnormal; Notable for the following components:      Result Value   Potassium 3.2 (*)    Glucose, Bld 138 (*)    All other components within normal limits  CBC - Abnormal; Notable for the following components:   WBC 12.3 (*)  RBC 5.27 (*)    Hemoglobin 16.0 (*)    HCT 48.1 (*)    All other components within normal limits  URINALYSIS, ROUTINE W REFLEX MICROSCOPIC - Abnormal; Notable for the following components:   Color, Urine AMBER (*)    APPearance HAZY (*)    Specific Gravity, Urine 1.036 (*)    Hgb urine dipstick SMALL (*)    Ketones, ur 20 (*)    Protein, ur 100 (*)    Leukocytes, UA SMALL (*)    Bacteria, UA MANY (*)    All other components within normal limits  URINE CULTURE  LIPASE, BLOOD  HIV ANTIBODY (ROUTINE TESTING W REFLEX)  CBC  BASIC METABOLIC PANEL  I-STAT BETA HCG BLOOD, ED ([facility], [facility], [facility] ONLY)    EKG None  Radiology Ct Abdomen Pelvis W Contrast  Result Date: 09/09/2018 CLINICAL DATA:  Abdominal pain with nausea, vomiting and diarrhea EXAM: CT ABDOMEN AND PELVIS WITH CONTRAST TECHNIQUE: Multidetector CT imaging of the abdomen and pelvis was performed using the standard protocol following bolus administration of intravenous contrast. CONTRAST:  100mL ISOVUE-300 IOPAMIDOL (ISOVUE-300) INJECTION 61% COMPARISON:  None. FINDINGS: Lower chest: Normal heart size without pericardial effusion or thickening. Bibasilar dependent atelectasis without effusion, pulmonary consolidation or pneumothorax. No dominant mass. Hepatobiliary: Steatosis of the liver without biliary nor mass. Normal gallbladder free of stones. Pancreas: Normal Spleen: Normal size spleen with adjacent small splenule. Adrenals/Urinary Tract: Normal bilateral adrenal glands and kidneys. No nephrolithiasis nor obstructive uropathy. The urinary bladder is unremarkable  for the degree of distention. Stomach/Bowel: Physiologic distention of the stomach with normal small bowel rotation. Mild fluid-filled distention jejunal loops to 3.5 cm in caliber secondary to a right periumbilical omental fat and small bowel containing hernia, series 4/75. This is likely contributing to dilatation of small bowel and partial or early SBO. No apparent incarceration. Adjacent to this is another ventral hernia containing small bowel and omental fat. There is fecalized material within the segment of small bowel projecting into the hernia as well as just outside the ventral hernia opening. The transverse dimension of the common hernia orifice is 9.5 cm which than separates into the two separate hernia sacs as above described. No large bowel dilatation or herniation is identified. There is colonic diverticulosis along the descending and sigmoid colon. Vascular/Lymphatic: No significant vascular findings are present. No enlarged abdominal or pelvic lymph nodes. Reproductive: Uterus and bilateral adnexa are unremarkable. Other: No free air or free fluid. Subcutaneous soft tissue induration of the included pannus. Musculoskeletal: Degenerative disc disease L4-5 and L5-S1. Facet arthropathy L3 through S1. IMPRESSION: 1. Mild fluid-filled distention of jejunal loops up to 3.5 cm secondary to a right periumbilical omental fat and small bowel containing hernia. This is likely contributing to dilatation of small bowel. No incarceration is identified. Adjacent to this is another ventral hernia containing small bowel and omental fat with small amount of fecalized material noted. This too is likely contributing to early or partial SBO. 2. Hepatic steatosis. 3. Lower lumbar degenerative disc disease L4-5 and L5-S1. Electronically Signed   By: Tollie Ethavid  Kwon M.D.   On: 09/09/2018 22:41    Procedures Procedures (including critical care time)  Medications Ordered in ED Medications  ondansetron (ZOFRAN) injection 4  mg (4 mg Intravenous Given 09/09/18 1831)  sodium chloride 0.9 % bolus 2,000 mL (1,000 mLs Intravenous New Bag/Given (Non-Interop) 09/09/18 2140)  promethazine (PHENERGAN) injection 25 mg (25 mg Intravenous Given 09/09/18 2143)  iopamidol (ISOVUE-300) 61 % injection 100  mL (100 mLs Intravenous Contrast Given 09/09/18 2149)     Initial Impression / Assessment and Plan / ED Course  I have reviewed the triage vital signs and the nursing notes.  Pertinent labs & imaging results that were available during my care of the patient were reviewed by me and considered in my medical decision making (see chart for details).  Clinical Course as of Sep 10 2343  Wed Sep 09, 2018  2111 Patient verified that she has never had a problem with CT scan dye.  She lists it as an allergy due to her metformin but has never had acute renal failure, rashes or any other issues with CT scan dye.    [EH]  2123 Received call from CT scan that with the allergy listed in her chart they are unable to pull her contrast with out pre-meds.    [EH]  2136 Spoke with patient and her wife.  Patient understands that CT contrast will be taken off her allergy list.  Patient is an RN previously and states her understanding of this and the reasons.   [EH]  2319 Spoke with Dr. Drexel IhaKissinger from general surgery, he recommends putting an NG tube and will be in to see patient.    [EH]  2330 Spoke with Dr. Julian ReilGardner who will admit the patient.    [EH]    Clinical Course User Index [EH] Cristina GongHammond, Leatha Rohner W, PA-C   Patient presents today for evaluation of approximately 4 days of generalized abdominal pain.  Is unable to tolerate anything p.o. her white count is slightly elevated at 12.0.  Lipase is not elevated.  CMP is significant for a potassium of 3.2 and a glucose of 138, however is otherwise normal.  It is contaminated with many bacteria, 11-20 squamous epithelial cells, 21-50 whites.  It is nitrate negative.  Patient is not having urinary  symptoms, therefore suspect contamination.  Culture is sent.  CT scan of abdomen was obtained, showing concern for a small bowel obstruction due to a hernia.  Reduction attempted by Dr. Rubin PayorPickering, unsuccessful.    I spoke with Dr. Ellouise NewerKessinger from general surgery who asked for a NG tube to be placed and says that he will come see the patient tonight.  I spoke with Dr. Julian ReilGardner who agreed to admit patient.   Final Clinical Impressions(s) / ED Diagnoses   Final diagnoses:  SBO (small bowel obstruction) (HCC)  Nausea vomiting and diarrhea  Generalized abdominal pain    ED Discharge Orders    None       Norman ClayHammond, Tamantha Saline W, PA-C 09/09/18 2346    Benjiman CorePickering, Nathan, MD 09/09/18 [phone]

## 2018-09-09 NOTE — H&P (Signed)
Kelly Vargas is an 48 y.o. female.   Chief Complaint: vomiting HPI: 48 yo female had 3 days of abdominal pain. Initially pain improved with a bowel movement but then returned and then had large amount of vomiting and nausea. Her last bowel movement and flatus were 2 days ago. Pain is epigastric and constant. She has not had pain like this before. She has had an umbilical hernia repair and ventral hernia repair in the distant past related to activity. She previously smoked but has stopped.  Past Medical History:  Diagnosis Date  . Anxiety   . Asthma   . Depression   . Diabetes mellitus without complication (HCC)   . GERD (gastroesophageal reflux disease)   . Hernia   . Hypertension   . IBS (irritable bowel syndrome)   . Migraine   . Morbid obesity (HCC)   . Polycystic ovarian disease   . Rosacea   . Sleep apnea     Past Surgical History:  Procedure Laterality Date  . ADENOIDECTOMY    . BACK SURGERY    . HERNIA REPAIR    . TONSILECTOMY, ADENOIDECTOMY, BILATERAL MYRINGOTOMY AND TUBES    . UMBILICAL HERNIA REPAIR    . ventrical hernia repair      Family History  Problem Relation Age of Onset  . Heart disease Mother   . Cancer Mother   . Stroke Mother   . Diabetes Mother   . Hypertension Mother   . Heart disease Father   . Hypertension Brother   . High Cholesterol Brother   . Depression Brother    Social History:  reports that she quit smoking about 12 years ago. Her smoking use included cigarettes. She has a 20.00 pack-year smoking history. She has never used smokeless tobacco. She reports current alcohol use. She reports that she does not use drugs.  Allergies:  Allergies  Allergen Reactions  . Chamomile Swelling  . Levomenol Swelling  . Penicillins Hives  . Erythromycin   . Morphine Nausea And Vomiting    Pt states morphine makes me have nausea and vomiting.    (Not in a hospital admission)   Results for orders placed or performed during the hospital  encounter of 09/09/18 (from the past 48 hour(s))  Urinalysis, Routine w reflex microscopic     Status: Abnormal   Collection Time: 09/09/18  4:23 PM  Result Value Ref Range   Color, Urine AMBER (A) YELLOW    Comment: BIOCHEMICALS MAY BE AFFECTED BY COLOR   APPearance HAZY (A) CLEAR   Specific Gravity, Urine 1.036 (H) 1.005 - 1.030   pH 5.0 5.0 - 8.0   Glucose, UA NEGATIVE NEGATIVE mg/dL   Hgb urine dipstick SMALL (A) NEGATIVE   Bilirubin Urine NEGATIVE NEGATIVE   Ketones, ur 20 (A) NEGATIVE mg/dL   Protein, ur 132100 (A) NEGATIVE mg/dL   Nitrite NEGATIVE NEGATIVE   Leukocytes, UA SMALL (A) NEGATIVE   RBC / HPF 6-10 0 - 5 RBC/hpf   WBC, UA 21-50 0 - 5 WBC/hpf   Bacteria, UA MANY (A) NONE SEEN   Squamous Epithelial / LPF 11-20 0 - 5   Mucus PRESENT     Comment: Performed at [facility], [address] [place], [place] [address]  Lipase, blood     Status: None   Collection Time: 09/09/18  5:02 PM  Result Value Ref Range   Lipase 33 11 - 51 U/L    Comment: Performed at [facility],  [address] [place], [place] [address]  Comprehensive metabolic panel     Status: Abnormal   Collection Time: 09/09/18  5:02 PM  Result Value Ref Range   Sodium 139 135 - 145 mmol/L   Potassium 3.2 (L) 3.5 - 5.1 mmol/L   Chloride 101 98 - 111 mmol/L   CO2 25 22 - 32 mmol/L   Glucose, Bld 138 (H) 70 - 99 mg/dL   BUN 17 6 - 20 mg/dL   Creatinine, Ser 5.280.80 0.44 - 1.00 mg/dL   Calcium 9.5 8.9 - 41.310.3 mg/dL   Total Protein 7.7 6.5 - 8.1 g/dL   Albumin 4.4 3.5 - 5.0 g/dL   AST 34 15 - 41 U/L   ALT 33 0 - 44 U/L   Alkaline Phosphatase 42 38 - 126 U/L   Total Bilirubin 0.9 0.3 - 1.2 mg/dL   GFR calc non Af Amer >60 >60 mL/min   GFR calc Af Amer >60 >60 mL/min   Anion gap 13 5 - 15    Comment: Performed at [facility], [address] [place], [place] [address]  CBC     Status: Abnormal   Collection Time: 09/09/18  5:02 PM  Result Value Ref  Range   WBC 12.3 (H) 4.0 - 10.5 K/uL   RBC 5.27 (H) 3.87 - 5.11 MIL/uL   Hemoglobin 16.0 (H) 12.0 - 15.0 g/dL   HCT 02.748.1 (H) 25.336.0 - 66.446.0 %   MCV 91.3 80.0 - 100.0 fL   MCH 30.4 26.0 - 34.0 pg   MCHC 33.3 30.0 - 36.0 g/dL   RDW 40.313.2 47.411.5 - 25.915.5 %   Platelets 291 150 - 400 K/uL   nRBC 0.0 0.0 - 0.2 %    Comment: Performed at [facility], [address] [place], [place] [address]  I-Stat beta hCG blood, ED     Status: None   Collection Time: 09/09/18  5:07 PM  Result Value Ref Range   I-stat hCG, quantitative <5.0 <5 mIU/mL   Comment 3            Comment:   GEST. AGE      CONC.  (mIU/mL)   <=1 WEEK        5 - 50     2 WEEKS       50 - 500     3 WEEKS       100 - 10,000     4 WEEKS     1,000 - 30,000        FEMALE AND NON-PREGNANT FEMALE:     LESS THAN 5 mIU/mL    Ct Abdomen Pelvis W Contrast  Result Date: 09/09/2018 CLINICAL DATA:  Abdominal pain with nausea, vomiting and diarrhea EXAM: CT ABDOMEN AND PELVIS WITH CONTRAST TECHNIQUE: Multidetector CT imaging of the abdomen and pelvis was performed using the standard protocol following bolus administration of intravenous contrast. CONTRAST:  100mL ISOVUE-300 IOPAMIDOL (ISOVUE-300) INJECTION 61% COMPARISON:  None. FINDINGS: Lower chest: Normal heart size without pericardial effusion or thickening. Bibasilar dependent atelectasis without effusion, pulmonary consolidation or pneumothorax. No dominant mass. Hepatobiliary: Steatosis of the liver without biliary nor mass. Normal gallbladder free of stones. Pancreas: Normal Spleen: Normal size spleen with adjacent small splenule. Adrenals/Urinary Tract: Normal bilateral adrenal glands and kidneys. No nephrolithiasis nor obstructive uropathy. The urinary bladder is unremarkable for the degree of distention. Stomach/Bowel: Physiologic distention of the stomach with normal small bowel rotation. Mild fluid-filled distention jejunal loops  to 3.5 cm in caliber secondary to a right  periumbilical omental fat and small bowel containing hernia, series 4/75. This is likely contributing to dilatation of small bowel and partial or early SBO. No apparent incarceration. Adjacent to this is another ventral hernia containing small bowel and omental fat. There is fecalized material within the segment of small bowel projecting into the hernia as well as just outside the ventral hernia opening. The transverse dimension of the common hernia orifice is 9.5 cm which than separates into the two separate hernia sacs as above described. No large bowel dilatation or herniation is identified. There is colonic diverticulosis along the descending and sigmoid colon. Vascular/Lymphatic: No significant vascular findings are present. No enlarged abdominal or pelvic lymph nodes. Reproductive: Uterus and bilateral adnexa are unremarkable. Other: No free air or free fluid. Subcutaneous soft tissue induration of the included pannus. Musculoskeletal: Degenerative disc disease L4-5 and L5-S1. Facet arthropathy L3 through S1. IMPRESSION: 1. Mild fluid-filled distention of jejunal loops up to 3.5 cm secondary to a right periumbilical omental fat and small bowel containing hernia. This is likely contributing to dilatation of small bowel. No incarceration is identified. Adjacent to this is another ventral hernia containing small bowel and omental fat with small amount of fecalized material noted. This too is likely contributing to early or partial SBO. 2. Hepatic steatosis. 3. Lower lumbar degenerative disc disease L4-5 and L5-S1. Electronically Signed   By: Tollie Eth M.D.   On: 09/09/2018 22:41    Review of Systems  Constitutional: Negative for chills and fever.  HENT: Negative for hearing loss.   Eyes: Negative for blurred vision and double vision.  Respiratory: Negative for cough and hemoptysis.   Cardiovascular: Negative for chest pain and palpitations.  Gastrointestinal: Positive for abdominal pain, nausea and  vomiting. Negative for diarrhea.  Genitourinary: Negative for dysuria and urgency.  Musculoskeletal: Negative for myalgias and neck pain.  Skin: Negative for itching and rash.  Neurological: Negative for dizziness, tingling and headaches.  Endo/Heme/Allergies: Does not bruise/bleed easily.  Psychiatric/Behavioral: Negative for depression and suicidal ideas.    Blood pressure (!) 155/91, pulse 66, temperature 97.9 F (36.6 C), temperature source Oral, resp. rate 19, SpO2 99 %. Physical Exam  Vitals reviewed. Constitutional: She is oriented to person, place, and time. She appears well-developed and well-nourished. She appears distressed.  HENT:  Head: Normocephalic and atraumatic.  Eyes: Pupils are equal, round, and reactive to light. Conjunctivae and EOM are normal.  Neck: Normal range of motion. Neck supple.  Cardiovascular: Normal rate and regular rhythm.  Respiratory: Effort normal.  GI: Soft. She exhibits distension. There is abdominal tenderness.  Musculoskeletal: Normal range of motion.  Neurological: She is alert and oriented to person, place, and time.  Skin: Skin is warm and dry.  Psychiatric: She has a normal mood and affect. Her behavior is normal.     Assessment/Plan 48 yo female with morbid obesity, OHS on bipap, and multiple other medical problems presents with small bowel obstruction. Transition point is related to the hernia edge but no signs of strangulation are seen. Therefore, I think it is reasonable to proceed with NG tube decompression and reevaluation in the morning for potential nonoperative management given very large risks of morbidity with operative intervention and very high risk of recurrence. Patient showed good understanding of this and agreed to proceed with current plan. -NG tube placement and decompression -pain control  Rodman Pickle, MD 09/09/2018, 11:47 PM

## 2018-09-09 NOTE — ED Triage Notes (Signed)
Per [facility] pt from home for abd pains. Had n/v/d. 20g left AC Normal Saline given in route.  Vitals: 111/83, 74Hr, 97% on room air. CBG 162 Has prescription for Zofran tablet but vomited that up.

## 2018-09-10 ENCOUNTER — Inpatient Hospital Stay (HOSPITAL_COMMUNITY): Payer: 59

## 2018-09-10 ENCOUNTER — Other Ambulatory Visit: Payer: Self-pay

## 2018-09-10 DIAGNOSIS — F419 Anxiety disorder, unspecified: Secondary | ICD-10-CM

## 2018-09-10 DIAGNOSIS — R1084 Generalized abdominal pain: Secondary | ICD-10-CM

## 2018-09-10 DIAGNOSIS — F329 Major depressive disorder, single episode, unspecified: Secondary | ICD-10-CM

## 2018-09-10 DIAGNOSIS — F32A Depression, unspecified: Secondary | ICD-10-CM

## 2018-09-10 DIAGNOSIS — E119 Type 2 diabetes mellitus without complications: Secondary | ICD-10-CM

## 2018-09-10 LAB — CBC
HCT: 45.1 % (ref 36.0–46.0)
HEMATOCRIT: 46.3 % — AB (ref 36.0–46.0)
Hemoglobin: 15.3 g/dL — ABNORMAL HIGH (ref 12.0–15.0)
Hemoglobin: 14.6 g/dL (ref 12.0–15.0)
MCH: 30.4 pg (ref 26.0–34.0)
MCH: 30.3 pg (ref 26.0–34.0)
MCHC: 33 g/dL (ref 30.0–36.0)
MCHC: 32.4 g/dL (ref 30.0–36.0)
MCV: 92 fL (ref 80.0–100.0)
MCV: 93.6 fL (ref 80.0–100.0)
Platelets: 286 10*3/uL (ref 150–400)
Platelets: 253 10*3/uL (ref 150–400)
RBC: 5.03 MIL/uL (ref 3.87–5.11)
RBC: 4.82 MIL/uL (ref 3.87–5.11)
RDW: 13.4 % (ref 11.5–15.5)
RDW: 13.2 % (ref 11.5–15.5)
WBC: 14.3 10*3/uL — ABNORMAL HIGH (ref 4.0–10.5)
WBC: 13.1 10*3/uL — ABNORMAL HIGH (ref 4.0–10.5)
nRBC: 0 % (ref 0.0–0.2)
nRBC: 0 % (ref 0.0–0.2)

## 2018-09-10 LAB — BASIC METABOLIC PANEL
Anion gap: 13 (ref 5–15)
BUN: 15 mg/dL (ref 6–20)
CO2: 23 mmol/L (ref 22–32)
Calcium: 8.8 mg/dL — ABNORMAL LOW (ref 8.9–10.3)
Chloride: 102 mmol/L (ref 98–111)
Creatinine, Ser: 0.7 mg/dL (ref 0.44–1.00)
GFR calc Af Amer: >60 mL/min (ref >60)
GFR calc non Af Amer: >60 mL/min (ref >60)
Glucose, Bld: 138 mg/dL — ABNORMAL HIGH (ref 70–99)
Potassium: 3.4 mmol/L — ABNORMAL LOW (ref 3.5–5.1)
Sodium: 138 mmol/L (ref 135–145)

## 2018-09-10 LAB — GLUCOSE, CAPILLARY
Glucose-Capillary: 132 mg/dL — ABNORMAL HIGH (ref 70–99)
Glucose-Capillary: 126 mg/dL — ABNORMAL HIGH (ref 70–99)
Glucose-Capillary: 131 mg/dL — ABNORMAL HIGH (ref 70–99)
Glucose-Capillary: 116 mg/dL — ABNORMAL HIGH (ref 70–99)
Glucose-Capillary: 126 mg/dL — ABNORMAL HIGH (ref 70–99)
Glucose-Capillary: 136 mg/dL — ABNORMAL HIGH (ref 70–99)

## 2018-09-10 LAB — HIV ANTIBODY (ROUTINE TESTING W REFLEX): HIV Screen 4th Generation wRfx: NONREACTIVE

## 2018-09-10 LAB — MAGNESIUM: Magnesium: 2.3 mg/dL (ref 1.7–2.4)

## 2018-09-10 MED ORDER — LORAZEPAM 2 MG/ML IJ SOLN
0.5000 mg | Freq: Three times a day (TID) | INTRAMUSCULAR | Status: DC | PRN
  Administered 2018-09-11 – 2018-09-13 (×3): 0.5 mg via INTRAVENOUS
  Filled 2018-09-10 (×3): qty 1

## 2018-09-10 MED ORDER — ONDANSETRON HCL 4 MG/2ML IJ SOLN
4.0000 mg | Freq: Once | INTRAMUSCULAR | Status: AC
  Administered 2018-09-10: 4 mg via INTRAVENOUS
  Filled 2018-09-10: qty 2

## 2018-09-10 MED ORDER — LIDOCAINE HCL URETHRAL/MUCOSAL 2 % EX GEL
1.0000 "application " | Freq: Once | CUTANEOUS | Status: AC
  Administered 2018-09-10: 1 via TOPICAL
  Filled 2018-09-10: qty 5

## 2018-09-10 MED ORDER — POTASSIUM CHLORIDE 10 MEQ/100ML IV SOLN
10.0000 meq | INTRAVENOUS | Status: AC
  Administered 2018-09-10 (×4): 10 meq via INTRAVENOUS
  Filled 2018-09-10: qty 100

## 2018-09-10 MED ORDER — FAMOTIDINE IN NACL 20-0.9 MG/50ML-% IV SOLN
20.0000 mg | Freq: Two times a day (BID) | INTRAVENOUS | Status: DC
  Administered 2018-09-10 – 2018-09-14 (×10): 20 mg via INTRAVENOUS
  Filled 2018-09-10 (×10): qty 50

## 2018-09-10 MED ORDER — ENOXAPARIN SODIUM 80 MG/0.8ML ~~LOC~~ SOLN
80.0000 mg | SUBCUTANEOUS | Status: DC
  Administered 2018-09-10 – 2018-09-15 (×6): 80 mg via SUBCUTANEOUS
  Filled 2018-09-10 (×6): qty 0.8

## 2018-09-10 MED ORDER — DIATRIZOATE MEGLUMINE & SODIUM 66-10 % PO SOLN
90.0000 mL | Freq: Once | ORAL | Status: AC
  Administered 2018-09-10: 90 mL via NASOGASTRIC
  Filled 2018-09-10: qty 90

## 2018-09-10 MED ORDER — HYDRALAZINE HCL 20 MG/ML IJ SOLN: 10.0000 mg | Freq: Four times a day (QID) | INTRAMUSCULAR | Status: DC | PRN

## 2018-09-10 MED ORDER — METOPROLOL TARTRATE 5 MG/5ML IV SOLN
5.0000 mg | Freq: Three times a day (TID) | INTRAVENOUS | Status: DC
  Administered 2018-09-10 – 2018-09-16 (×17): 5 mg via INTRAVENOUS
  Filled 2018-09-10 (×17): qty 5

## 2018-09-10 NOTE — Progress Notes (Signed)
Pt unable to tolerate BiPAP qhs due to NG tube still in place.  RT will continue to keep track for NG tube removal.

## 2018-09-10 NOTE — Progress Notes (Signed)
Notified MD again regarding cardiac monitoring and Bipap. NP did discontinue the cardiac  monitoring.  Bipap order may be addressed on today  Or discontinued.  Pt is wearing Corry 02 at 2L  With sat of 95.

## 2018-09-10 NOTE — Progress Notes (Signed)
Patient ID: Kelly Vargas, female   DOB: 1971-05-10, 48 y.o.   MRN: [id number]       Subjective: Still with no flatus or BM.  Last BM or flatus was on Monday morning.  Has had over 1.5L of NGT output since placement overnight.    Objective: Vital signs in last 24 hours: Temp:  [97.9 F (36.6 C)-98.6 F (37 C)] 98.6 F (37 C) (01/09 0603) Pulse Rate:  [66-85] 85 (01/09 0603) Resp:  [17-19] 18 (01/09 0603) BP: (137-159)/(65-110) 137/65 (01/09 0603) SpO2:  [93 %-99 %] 94 % (01/09 0603) Weight:  [188.9 kg] 188.9 kg (01/09 0700) Last BM Date: 09/07/18  Intake/Output from previous day: 01/08 0701 - 01/09 0700 In: 2676.4 [P.O.:200; I.V.:476.4; IV Piggyback:2000] Out: 1525 [Urine:300; Emesis/NG output:1225] Intake/Output this shift: Total I/O In: 487.5 [I.V.:487.5] Out: -   PE: Heart: regular Lungs: CTAB Abd: soft, morbidly obese, periumbilical hernia noted and contents palpable and remain in hernia.  NGT in place.  Cannister just changed, but contents in tubing appear brown and somewhat feculent appearing.  Lab Results:  Recent Labs    09/09/18 1702 09/10/18 0451  WBC 12.3* 14.3*  HGB 16.0* 15.3*  HCT 48.1* 46.3*  PLT 291 286   BMET Recent Labs    09/09/18 1702 09/10/18 0451  NA 139 138  K 3.2* 3.4*  CL 101 102  CO2 25 23  GLUCOSE 138* 138*  BUN 17 15  CREATININE 0.80 0.70  CALCIUM 9.5 8.8*   PT/INR No results for input(s): LABPROT, INR in the last 72 hours. CMP     Component Value Date/Time   NA 138 09/10/2018 0451   K 3.4 (L) 09/10/2018 0451   CL 102 09/10/2018 0451   CO2 23 09/10/2018 0451   GLUCOSE 138 (H) 09/10/2018 0451   BUN 15 09/10/2018 0451   CREATININE 0.70 09/10/2018 0451   CALCIUM 8.8 (L) 09/10/2018 0451   PROT 7.7 09/09/2018 1702   ALBUMIN 4.4 09/09/2018 1702   AST 34 09/09/2018 1702   ALT 33 09/09/2018 1702   ALKPHOS 42 09/09/2018 1702   BILITOT 0.9 09/09/2018 1702   GFRNONAA >60 09/10/2018 0451   GFRAA >60 09/10/2018 0451    Lipase     Component Value Date/Time   LIPASE 33 09/09/2018 1702       Studies/Results: Dg Abdomen 1 View  Result Date: 09/10/2018 CLINICAL DATA:  NG tube placement. EXAM: ABDOMEN - 1 VIEW COMPARISON:  CT earlier this day. FINDINGS: Tip and side port of the enteric tube below the diaphragm in the stomach. Fluid-filled dilated bowel on CT is not seen by radiograph. Excreted IV contrast in the renal collecting systems from prior CT. IMPRESSION: Tip and side port of the enteric tube below the diaphragm in the stomach. Electronically Signed   By: Narda Rutherford M.D.   On: 09/10/2018 01:15   Ct Abdomen Pelvis W Contrast  Result Date: 09/09/2018 CLINICAL DATA:  Abdominal pain with nausea, vomiting and diarrhea EXAM: CT ABDOMEN AND PELVIS WITH CONTRAST TECHNIQUE: Multidetector CT imaging of the abdomen and pelvis was performed using the standard protocol following bolus administration of intravenous contrast. CONTRAST:  ISOVUE-300 IOPAMIDOL (ISOVUE-300) INJECTION 61% COMPARISON:  None. FINDINGS: Lower chest: Normal heart size without pericardial effusion or thickening. Bibasilar dependent atelectasis without effusion, pulmonary consolidation or pneumothorax. No dominant mass. Hepatobiliary: Steatosis of the liver without biliary nor mass. Normal gallbladder free of stones. Pancreas: Normal Spleen: Normal size spleen with adjacent small splenule. Adrenals/Urinary Tract:  Normal bilateral adrenal glands and kidneys. No nephrolithiasis nor obstructive uropathy. The urinary bladder is unremarkable for the degree of distention. Stomach/Bowel: Physiologic distention of the stomach with normal small bowel rotation. Mild fluid-filled distention jejunal loops to 3.5 cm in caliber secondary to a right periumbilical omental fat and small bowel containing hernia, series 4/75. This is likely contributing to dilatation of small bowel and partial or early SBO. No apparent incarceration. Adjacent to this is  another ventral hernia containing small bowel and omental fat. There is fecalized material within the segment of small bowel projecting into the hernia as well as just outside the ventral hernia opening. The transverse dimension of the common hernia orifice is 9.5 cm which than separates into the two separate hernia sacs as above described. No large bowel dilatation or herniation is identified. There is colonic diverticulosis along the descending and sigmoid colon. Vascular/Lymphatic: No significant vascular findings are present. No enlarged abdominal or pelvic lymph nodes. Reproductive: Uterus and bilateral adnexa are unremarkable. Other: No free air or free fluid. Subcutaneous soft tissue induration of the included pannus. Musculoskeletal: Degenerative disc disease L4-5 and L5-S1. Facet arthropathy L3 through S1. IMPRESSION: 1. Mild fluid-filled distention of jejunal loops up to 3.5 cm secondary to a right periumbilical omental fat and small bowel containing hernia. This is likely contributing to dilatation of small bowel. No incarceration is identified. Adjacent to this is another ventral hernia containing small bowel and omental fat with small amount of fecalized material noted. This too is likely contributing to early or partial SBO. 2. Hepatic steatosis. 3. Lower lumbar degenerative disc disease L4-5 and L5-S1. Electronically Signed   By: Tollie Eth M.D.   On: 09/09/2018 22:41    Anti-infectives: Anti-infectives (From admission, onward)   None       Assessment/Plan Morbidly obese, BMI 58 OSA PCOS HTN DM  SBO with periumbilical hernia   -persistent SBO clinically.  Will start SBO protocol today and follow -patient is high surgical risk given morbidly obese size and surgery would not be easy to correct this problem. -will follow 8 hr delay films -may have ice chips today -mobilize OOB and may clamp NGT to do so. -IS  FEN - NPO/NGT/ice VTE - may have chemical prophylaxis from our  standpoint ID - none currently needed   LOS: 1 day    Letha Cape , [facility] Surgery 09/10/2018, 9:21 AM Pager: [phone]

## 2018-09-10 NOTE — Progress Notes (Signed)
PROGRESS NOTE    Kelly MonteKristy M Gabrielle  WUJ:[id number] DOB: 06/15/1971 DOA: 09/09/2018 PCP: Boneta LucksBrown, Jennifer, NP    Brief Narrative:   Kelly Vargas is a 48 y.o. female with medical history significant of PCOS, DM, HTN.  Patient presents to the ED for evaluation of abd pain, N/V.  Symptoms onset 4 days ago, worsening.  Unable to tolerate anything PO.  Does report episodes of diarrhea.   ED Course: CT reveals PSBO likely due to 2 separate hernias contributing (periumbilical and ventral hernias).   Assessment & Plan:   Principal Problem:   SBO (small bowel obstruction) (HCC) Active Problems:   High blood pressure associated with diabetes (HCC)   Periumbilical hernia   Ventral hernia   Generalized abdominal pain   Type 2 diabetes mellitus without complication, without long-term current use of insulin (HCC)   Depression   Anxiety  1 small bowel obstruction versus partial small bowel obstruction Secondary to 2 hernias.  Patient currently n.p.o.  NG tube in place.  Patient has been seen in consultation by general surgery who are recommending conservative management at this time as patient will be difficult operative repair with high likelihood of eventual failure.  IV fluids, supportive care, mobilize.  Abdominal films repeat pending for this afternoon.  General surgery following and appreciate input and recommendations.  2.  Obstructive sleep apnea BiPAP nightly  3.  Diabetes mellitus type 2 Oral hypoglycemic agents on hold.  Check a hemoglobin A1c.  Sliding scale insulin.  4.  Hypertension Hold HCTZ and spironolactone.  Placed on IV Lopressor.  Follow.  5.  Depression/anxiety Patient currently n.p.o. and as such we will hold Cymbalta and Lexapro for now.  Place on IV Ativan as needed.   DVT prophylaxis: Lovenox Code Status: Full Family Communication: Updated patient and wife at bedside. Disposition Plan: Home once clinically improved, resolution of small bowel obstruction and per  general surgery.   Consultants:   General surgery: Dr. Sheliah HatchKinsinger 09/09/2018  Procedures:  CT abdomen and pelvis 09/09/2018  Antimicrobials:   None   Subjective: Sitting up in bed.  NG tube in place.  Patient still with abdominal pain however states some improvement since admission.  No further episodes of emesis.  Objective: Vitals:   09/10/18 0700 09/10/18 1333 09/10/18 1335 09/10/18 1503  BP:  (!) 150/106 (!) 163/122 (!) 141/84  Pulse:  93 94   Resp:   19   Temp:  98.1 F (36.7 C)    TempSrc:  Oral    SpO2:  95%    Weight: (!) 188.9 kg     Height: 5\' 11"  (1.803 m)       Intake/Output Summary (Last 24 hours) at 09/10/2018 1959 Last data filed at 09/10/2018 1800 Gross per 24 hour  Intake 5097.75 ml  Output 3375 ml  Net 1722.75 ml   Filed Weights   09/10/18 0700  Weight: (!) 188.9 kg    Examination:  General exam: NGT in place. Respiratory system: Clear to auscultation. Respiratory effort normal. Cardiovascular system: S1 & S2 heard, RRR. No JVD, murmurs, rubs, gallops or clicks. No pedal edema. Gastrointestinal system: Abdomen is nondistended, soft and tender to palpation in the periumbilical region and lower abdomen.  Palpable firm incarcerated lower midline abdominal wall hernia noted.  Hypoactive bowel sounds.  No rebound.  No guarding.   Central nervous system: Alert and oriented. No focal neurological deficits. Extremities: Symmetric 5 x 5 power. Skin: No rashes, lesions or ulcers Psychiatry: Judgement and insight  appear normal. Mood & affect appropriate.     Data Reviewed: I have personally reviewed following labs and imaging studies  CBC: Recent Labs  Lab 09/09/18 1702 09/10/18 0451  WBC 12.3* 14.3*  HGB 16.0* 15.3*  HCT 48.1* 46.3*  MCV 91.3 92.0  PLT 291 286   Basic Metabolic Panel: Recent Labs  Lab 09/09/18 1702 09/10/18 0451  NA 139 138  K 3.2* 3.4*  CL 101 102  CO2 25 23  GLUCOSE 138* 138*  BUN 17 15  CREATININE 0.80 0.70  CALCIUM  9.5 8.8*  MG  --  2.3   GFR: Estimated Creatinine Clearance: 165.4 mL/min (by C-G formula based on SCr of 0.7 mg/dL). Liver Function Tests: Recent Labs  Lab 09/09/18 1702  AST 34  ALT 33  ALKPHOS 42  BILITOT 0.9  PROT 7.7  ALBUMIN 4.4   Recent Labs  Lab 09/09/18 1702  LIPASE 33   No results for input(s): AMMONIA in the last 168 hours. Coagulation Profile: No results for input(s): INR, PROTIME in the last 168 hours. Cardiac Enzymes: No results for input(s): CKTOTAL, CKMB, CKMBINDEX, TROPONINI in the last 168 hours. BNP (last 3 results) No results for input(s): PROBNP in the last 8760 hours. HbA1C: No results for input(s): HGBA1C in the last 72 hours. CBG: Recent Labs  Lab 09/10/18 0217 09/10/18 0420 09/10/18 0730 09/10/18 1129 09/10/18 1655  GLUCAP 132* 126* 131* 116* 126*   Lipid Profile: No results for input(s): CHOL, HDL, LDLCALC, TRIG, CHOLHDL, LDLDIRECT in the last 72 hours. Thyroid Function Tests: No results for input(s): TSH, T4TOTAL, FREET4, T3FREE, THYROIDAB in the last 72 hours. Anemia Panel: No results for input(s): VITAMINB12, FOLATE, FERRITIN, TIBC, IRON, RETICCTPCT in the last 72 hours. Sepsis Labs: No results for input(s): PROCALCITON, LATICACIDVEN in the last 168 hours.  No results found for this or any previous visit (from the past 240 hour(s)).       Radiology Studies: Dg Abdomen 1 View  Result Date: 09/10/2018 CLINICAL DATA:  NG tube placement. EXAM: ABDOMEN - 1 VIEW COMPARISON:  CT earlier this day. FINDINGS: Tip and side port of the enteric tube below the diaphragm in the stomach. Fluid-filled dilated bowel on CT is not seen by radiograph. Excreted IV contrast in the renal collecting systems from prior CT. IMPRESSION: Tip and side port of the enteric tube below the diaphragm in the stomach. Electronically Signed   By: Narda RutherfordMelanie  Sanford M.D.   On: 09/10/2018 01:15   Ct Abdomen Pelvis W Contrast  Result Date: 09/09/2018 CLINICAL DATA:   Abdominal pain with nausea, vomiting and diarrhea EXAM: CT ABDOMEN AND PELVIS WITH CONTRAST TECHNIQUE: Multidetector CT imaging of the abdomen and pelvis was performed using the standard protocol following bolus administration of intravenous contrast. CONTRAST:  100mL ISOVUE-300 IOPAMIDOL (ISOVUE-300) INJECTION 61% COMPARISON:  None. FINDINGS: Lower chest: Normal heart size without pericardial effusion or thickening. Bibasilar dependent atelectasis without effusion, pulmonary consolidation or pneumothorax. No dominant mass. Hepatobiliary: Steatosis of the liver without biliary nor mass. Normal gallbladder free of stones. Pancreas: Normal Spleen: Normal size spleen with adjacent small splenule. Adrenals/Urinary Tract: Normal bilateral adrenal glands and kidneys. No nephrolithiasis nor obstructive uropathy. The urinary bladder is unremarkable for the degree of distention. Stomach/Bowel: Physiologic distention of the stomach with normal small bowel rotation. Mild fluid-filled distention jejunal loops to 3.5 cm in caliber secondary to a right periumbilical omental fat and small bowel containing hernia, series 4/75. This is likely contributing to dilatation of small bowel and  partial or early SBO. No apparent incarceration. Adjacent to this is another ventral hernia containing small bowel and omental fat. There is fecalized material within the segment of small bowel projecting into the hernia as well as just outside the ventral hernia opening. The transverse dimension of the common hernia orifice is 9.5 cm which than separates into the two separate hernia sacs as above described. No large bowel dilatation or herniation is identified. There is colonic diverticulosis along the descending and sigmoid colon. Vascular/Lymphatic: No significant vascular findings are present. No enlarged abdominal or pelvic lymph nodes. Reproductive: Uterus and bilateral adnexa are unremarkable. Other: No free air or free fluid. Subcutaneous  soft tissue induration of the included pannus. Musculoskeletal: Degenerative disc disease L4-5 and L5-S1. Facet arthropathy L3 through S1. IMPRESSION: 1. Mild fluid-filled distention of jejunal loops up to 3.5 cm secondary to a right periumbilical omental fat and small bowel containing hernia. This is likely contributing to dilatation of small bowel. No incarceration is identified. Adjacent to this is another ventral hernia containing small bowel and omental fat with small amount of fecalized material noted. This too is likely contributing to early or partial SBO. 2. Hepatic steatosis. 3. Lower lumbar degenerative disc disease L4-5 and L5-S1. Electronically Signed   By: Tollie Eth M.D.   On: 09/09/2018 22:41   Dg Abd Portable 1v-small Bowel Obstruction Protocol-initial, 8 Hr Delay  Result Date: 09/10/2018 CLINICAL DATA:  Small-bowel obstruction EXAM: PORTABLE ABDOMEN - 1 VIEW COMPARISON:  09/09/2018 FINDINGS: Gastric catheter is noted within the stomach which is decompressed. Multiple dilated loops of small bowel are again identified similar to that seen on the prior exam. No contrast material is identified. Correlate with clinical history. The overall appearance is similar to that seen on recent CT examination. IMPRESSION: Stable small bowel dilatation predominately on the left similar to that seen on recent CT. No contrast material is noted despite the given clinical history. Clinical correlation is recommended. Electronically Signed   By: Alcide Clever M.D.   On: 09/10/2018 18:46        Scheduled Meds: . enoxaparin (LOVENOX) injection  80 mg Subcutaneous Q24H  . insulin aspart  0-9 Units Subcutaneous Q4H   Continuous Infusions: . sodium chloride 125 mL/hr at 09/10/18 1646  . famotidine (PEPCID) IV 20 mg (09/10/18 1016)     LOS: 1 day    Time spent: 35 minutes    Ramiro Harvest, MD [facility] Pager [phone]  If 7PM-7AM, please contact  night-coverage [email or web address] Password [facility] 09/10/2018, 7:59 PM

## 2018-09-10 NOTE — ED Notes (Signed)
Attempting to obtain NG tube placement before pt transported upstairs.

## 2018-09-10 NOTE — Progress Notes (Signed)
BiPAP H/S order placed per admitting Physician per home use h/s, Pt. Is currently ordered to have NG placed and has been having (+) Nausea/Vomiting, plan to hold for this evening, RT to monitor.

## 2018-09-10 NOTE — Progress Notes (Signed)
Patient was ordered Cardiac monitoring  But was transferred to regular floor. Not cardiac monitoring unit.  Notified NP regarding need to transfer off unit or cancel cardiac monitoring.  Also notified regarding Bipap order for patient.  This also need to be on on ICU.  Respiratory therapist came and accessed and stated because patient has NGT in nostrol BiPap can not be applied. Informed MD regarding this.  Patient was placed on 2L 02 [place] and she's sating at 94% on that and pulse is 85. She has continuous pule ox  Monitoring.

## 2018-09-11 ENCOUNTER — Inpatient Hospital Stay (HOSPITAL_COMMUNITY): Payer: 59

## 2018-09-11 ENCOUNTER — Telehealth: Payer: Self-pay | Admitting: *Deleted

## 2018-09-11 LAB — CBC WITH DIFFERENTIAL/PLATELET
Abs Immature Granulocytes: 0.07 10*3/uL (ref 0.00–0.07)
Basophils Absolute: 0.1 10*3/uL (ref 0.0–0.1)
Basophils Relative: 1 %
EOS ABS: 0.1 10*3/uL (ref 0.0–0.5)
Eosinophils Relative: 1 %
HCT: 46.4 % — ABNORMAL HIGH (ref 36.0–46.0)
Hemoglobin: 14.9 g/dL (ref 12.0–15.0)
IMMATURE GRANULOCYTES: 1 %
Lymphocytes Relative: 18 %
Lymphs Abs: 2.1 10*3/uL (ref 0.7–4.0)
MCH: 29.7 pg (ref 26.0–34.0)
MCHC: 32.1 g/dL (ref 30.0–36.0)
MCV: 92.6 fL (ref 80.0–100.0)
Monocytes Absolute: 1.1 10*3/uL — ABNORMAL HIGH (ref 0.1–1.0)
Monocytes Relative: 9 %
NEUTROS PCT: 70 %
Neutro Abs: 8.5 10*3/uL — ABNORMAL HIGH (ref 1.7–7.7)
Platelets: 282 10*3/uL (ref 150–400)
RBC: 5.01 MIL/uL (ref 3.87–5.11)
RDW: 13.3 % (ref 11.5–15.5)
WBC: 12 10*3/uL — ABNORMAL HIGH (ref 4.0–10.5)
nRBC: 0 % (ref 0.0–0.2)

## 2018-09-11 LAB — BASIC METABOLIC PANEL
Anion gap: 11 (ref 5–15)
BUN: 14 mg/dL (ref 6–20)
CALCIUM: 8.6 mg/dL — AB (ref 8.9–10.3)
CO2: 25 mmol/L (ref 22–32)
Chloride: 105 mmol/L (ref 98–111)
Creatinine, Ser: 0.87 mg/dL (ref 0.44–1.00)
GFR calc Af Amer: >60 mL/min (ref >60)
GFR calc non Af Amer: >60 mL/min (ref >60)
Glucose, Bld: 131 mg/dL — ABNORMAL HIGH (ref 70–99)
Potassium: 3.7 mmol/L (ref 3.5–5.1)
Sodium: 141 mmol/L (ref 135–145)

## 2018-09-11 LAB — GLUCOSE, CAPILLARY
Glucose-Capillary: 117 mg/dL — ABNORMAL HIGH (ref 70–99)
Glucose-Capillary: 123 mg/dL — ABNORMAL HIGH (ref 70–99)
Glucose-Capillary: 124 mg/dL — ABNORMAL HIGH (ref 70–99)
Glucose-Capillary: 124 mg/dL — ABNORMAL HIGH (ref 70–99)
Glucose-Capillary: 128 mg/dL — ABNORMAL HIGH (ref 70–99)
Glucose-Capillary: 138 mg/dL — ABNORMAL HIGH (ref 70–99)

## 2018-09-11 LAB — MAGNESIUM: Magnesium: 2.3 mg/dL (ref 1.7–2.4)

## 2018-09-11 MED ORDER — PROCHLORPERAZINE EDISYLATE 10 MG/2ML IJ SOLN
10.0000 mg | Freq: Once | INTRAMUSCULAR | Status: AC
  Administered 2018-09-11: 10 mg via INTRAVENOUS
  Filled 2018-09-11: qty 2

## 2018-09-11 MED ORDER — POTASSIUM CHLORIDE 10 MEQ/100ML IV SOLN
10.0000 meq | INTRAVENOUS | Status: AC
  Administered 2018-09-11 (×4): 10 meq via INTRAVENOUS
  Filled 2018-09-11 (×4): qty 100

## 2018-09-11 MED ORDER — ONDANSETRON HCL 4 MG PO TABS
8.0000 mg | ORAL_TABLET | Freq: Four times a day (QID) | ORAL | Status: DC | PRN
  Administered 2018-09-15 (×2): 8 mg via ORAL
  Filled 2018-09-11 (×3): qty 2

## 2018-09-11 MED ORDER — SODIUM CHLORIDE 0.9 % IV SOLN
8.0000 mg | Freq: Four times a day (QID) | INTRAVENOUS | Status: DC | PRN
  Administered 2018-09-11 – 2018-09-14 (×8): 8 mg via INTRAVENOUS
  Filled 2018-09-11 (×9): qty 4

## 2018-09-11 MED ORDER — DIATRIZOATE MEGLUMINE & SODIUM 66-10 % PO SOLN
90.0000 mL | Freq: Once | ORAL | Status: AC
  Administered 2018-09-11: 90 mL via NASOGASTRIC
  Filled 2018-09-11: qty 90

## 2018-09-11 NOTE — Progress Notes (Signed)
Pt c/o nausea and abdominal pain. NG tube flushed with 10cc air and large bilious output started flowing much better. Pt report minimal relief. MD paged and one time dose of Zofran 4mg  IV given. Pt reported no relief. Dilaudid 0.5mg  IV given. Pt reported some relief. NG began to put out frank red drainage. MD notified. CBC ordered. H/H stable. No new orders placed. Output changed back to biliuos output. Pt resting comfortably in bed. Will continue to monitor with hourly rounding.

## 2018-09-11 NOTE — Progress Notes (Signed)
Patient ID: Kelly Vargas, female   DOB: 1970-09-15, 48 y.o.   MRN: [id number]       Subjective: Pain and nausea improved from yesterday. Unsure if she passed one episode of flatus yesterday, otherwise no flatus. No BM. 2,050 output from NG tube yesterday.   Objective: Vital signs in last 24 hours: Temp:  [98.1 F (36.7 C)-98.8 F (37.1 C)] 98.8 F (37.1 C) (01/10 0536) Pulse Rate:  [72-94] 86 (01/10 0536) Resp:  [16-19] 17 (01/10 0536) BP: (129-163)/(75-122) 129/75 (01/10 0536) SpO2:  [93 %-97 %] 97 % (01/10 0536) Last BM Date: 09/07/18  Intake/Output from previous day: 01/09 0701 - 01/10 0700 In: 3256.4 [P.O.:120; I.V.:2596.3; NG/GT:90; IV Piggyback:450.1] Out: 2900 [Urine:850; Emesis/NG output:2050] Intake/Output this shift: No intake/output data recorded.  PE: Heart: RRR Lungs: CTA b/l  Abd: Somewhat limited 2/2 body habitus. Soft, morbidly obese. Periumbillical hernia noted and contents palpable and remain in hernia. NGT in place. Cannister with ~800cc of fluid   Lab Results:  Recent Labs    09/10/18 2204 09/11/18 0448  WBC 13.1* 12.0*  HGB 14.6 14.9  HCT 45.1 46.4*  PLT 253 282   BMET Recent Labs    09/10/18 0451 09/11/18 0448  NA 138 141  K 3.4* 3.7  CL 102 105  CO2 23 25  GLUCOSE 138* 131*  BUN 15 14  CREATININE 0.70 0.87  CALCIUM 8.8* 8.6*   PT/INR No results for input(s): LABPROT, INR in the last 72 hours. CMP     Component Value Date/Time   NA 141 09/11/2018 0448   K 3.7 09/11/2018 0448   CL 105 09/11/2018 0448   CO2 25 09/11/2018 0448   GLUCOSE 131 (H) 09/11/2018 0448   BUN 14 09/11/2018 0448   CREATININE 0.87 09/11/2018 0448   CALCIUM 8.6 (L) 09/11/2018 0448   PROT 7.7 09/09/2018 1702   ALBUMIN 4.4 09/09/2018 1702   AST 34 09/09/2018 1702   ALT 33 09/09/2018 1702   ALKPHOS 42 09/09/2018 1702   BILITOT 0.9 09/09/2018 1702   GFRNONAA >60 09/11/2018 0448   GFRAA >60 09/11/2018 0448   Lipase     Component Value Date/Time   LIPASE 33 09/09/2018 1702       Studies/Results: Dg Abdomen 1 View  Result Date: 09/10/2018 CLINICAL DATA:  NG tube placement. EXAM: ABDOMEN - 1 VIEW COMPARISON:  CT earlier this day. FINDINGS: Tip and side port of the enteric tube below the diaphragm in the stomach. Fluid-filled dilated bowel on CT is not seen by radiograph. Excreted IV contrast in the renal collecting systems from prior CT. IMPRESSION: Tip and side port of the enteric tube below the diaphragm in the stomach. Electronically Signed   By: Narda Rutherford M.D.   On: 09/10/2018 01:15   Ct Abdomen Pelvis W Contrast  Result Date: 09/09/2018 CLINICAL DATA:  Abdominal pain with nausea, vomiting and diarrhea EXAM: CT ABDOMEN AND PELVIS WITH CONTRAST TECHNIQUE: Multidetector CT imaging of the abdomen and pelvis was performed using the standard protocol following bolus administration of intravenous contrast. CONTRAST:  ISOVUE-300 IOPAMIDOL (ISOVUE-300) INJECTION 61% COMPARISON:  None. FINDINGS: Lower chest: Normal heart size without pericardial effusion or thickening. Bibasilar dependent atelectasis without effusion, pulmonary consolidation or pneumothorax. No dominant mass. Hepatobiliary: Steatosis of the liver without biliary nor mass. Normal gallbladder free of stones. Pancreas: Normal Spleen: Normal size spleen with adjacent small splenule. Adrenals/Urinary Tract: Normal bilateral adrenal glands and kidneys. No nephrolithiasis nor obstructive uropathy. The urinary bladder is unremarkable  for the degree of distention. Stomach/Bowel: Physiologic distention of the stomach with normal small bowel rotation. Mild fluid-filled distention jejunal loops to 3.5 cm in caliber secondary to a right periumbilical omental fat and small bowel containing hernia, series 4/75. This is likely contributing to dilatation of small bowel and partial or early SBO. No apparent incarceration. Adjacent to this is another ventral hernia containing small bowel and  omental fat. There is fecalized material within the segment of small bowel projecting into the hernia as well as just outside the ventral hernia opening. The transverse dimension of the common hernia orifice is 9.5 cm which than separates into the two separate hernia sacs as above described. No large bowel dilatation or herniation is identified. There is colonic diverticulosis along the descending and sigmoid colon. Vascular/Lymphatic: No significant vascular findings are present. No enlarged abdominal or pelvic lymph nodes. Reproductive: Uterus and bilateral adnexa are unremarkable. Other: No free air or free fluid. Subcutaneous soft tissue induration of the included pannus. Musculoskeletal: Degenerative disc disease L4-5 and L5-S1. Facet arthropathy L3 through S1. IMPRESSION: 1. Mild fluid-filled distention of jejunal loops up to 3.5 cm secondary to a right periumbilical omental fat and small bowel containing hernia. This is likely contributing to dilatation of small bowel. No incarceration is identified. Adjacent to this is another ventral hernia containing small bowel and omental fat with small amount of fecalized material noted. This too is likely contributing to early or partial SBO. 2. Hepatic steatosis. 3. Lower lumbar degenerative disc disease L4-5 and L5-S1. Electronically Signed   By: Tollie Eth M.D.   On: 09/09/2018 22:41   Dg Abd Portable 1v-small Bowel Obstruction Protocol-initial, 8 Hr Delay  Result Date: 09/10/2018 CLINICAL DATA:  Small-bowel obstruction EXAM: PORTABLE ABDOMEN - 1 VIEW COMPARISON:  09/09/2018 FINDINGS: Gastric catheter is noted within the stomach which is decompressed. Multiple dilated loops of small bowel are again identified similar to that seen on the prior exam. No contrast material is identified. Correlate with clinical history. The overall appearance is similar to that seen on recent CT examination. IMPRESSION: Stable small bowel dilatation predominately on the left similar  to that seen on recent CT. No contrast material is noted despite the given clinical history. Clinical correlation is recommended. Electronically Signed   By: Alcide Clever M.D.   On: 09/10/2018 18:46    Anti-infectives: Anti-infectives (From admission, onward)   None       Assessment/Plan Morbidly obese, BMI 58 OSA PCOS HTN DM  SBO with periumbilical hernia   -persistent SBO clinically.   -SBO protocol initiated yesterday. No Gastografin visualized on 8 hour xray. Continued small bowel dilatation. Will repeat small bowel protocol today.  -Patient is high surgical risk given morbidly obese size and surgery would not be easy to correct this problem. -will follow 8 hr delay films -may have ice chips today -mobilize OOB and may clamp NGT to do so. -IS -Repeat labs in AM  FEN - NPO/NGT/ice VTE - Lovenox  ID - none currently needed   LOS: 2 days    Jacinto Halim , [facility] Surgery 09/11/2018, 9:24 AM Pager: [phone]

## 2018-09-11 NOTE — Progress Notes (Signed)
Pt. Remains with NG in place.  Patient will see if family will bring her CPAP from home, she used nasal pillows and may be able to utilize with NG in place.  Will be available if needed.

## 2018-09-11 NOTE — Progress Notes (Signed)
PROGRESS NOTE    Kelly Vargas  WUJ:[id number] DOB: 07/29/1971 DOA: 09/09/2018 PCP: Boneta LucksBrown, Jennifer, NP    Brief Narrative:   Kelly Vargas is a 48 y.o. female with medical history significant of PCOS, DM, HTN.  Patient presents to the ED for evaluation of abd pain, N/V.  Symptoms onset 4 days ago, worsening.  Unable to tolerate anything PO.  Does report episodes of diarrhea.   ED Course: CT reveals PSBO likely due to 2 separate hernias contributing (periumbilical and ventral hernias).   Assessment & Plan:   Principal Problem:   SBO (small bowel obstruction) (HCC) Active Problems:   High blood pressure associated with diabetes (HCC)   Periumbilical hernia   Ventral hernia   Generalized abdominal pain   Type 2 diabetes mellitus without complication, without long-term current use of insulin (HCC)   Depression   Anxiety  1 small bowel obstruction versus partial small bowel obstruction Secondary to 2 hernias.  Patient currently n.p.o.  NG tube in place.  Patient has been seen in consultation by general surgery who are recommending conservative management at this time as patient will be difficult operative repair with high likelihood of eventual failure.  IV fluids, supportive care, mobilize.  Patient with no bowel movement.  Complaint of nausea.  Small bowel protocol ordered per general surgery.  General surgery following and appreciate input and recommendations.  2.  Obstructive sleep apnea BiPAP nightly  3.  Diabetes mellitus type 2 Oral hypoglycemic agents on hold.  Continue sliding scale insulin.  4.  Hypertension Continue to hold HCTZ and spironolactone.  Continue IV Lopressor.  Follow.  5.  Depression/anxiety Patient currently n.p.o. and as such we will continue to hold Cymbalta and Lexapro for now.  Continue IV Ativan as needed.   DVT prophylaxis: Lovenox Code Status: Full Family Communication: Updated patient and wife at bedside. Disposition Plan: Home once  clinically improved, resolution of small bowel obstruction and per general surgery.   Consultants:   General surgery: Dr. Sheliah HatchKinsinger 09/09/2018  Small bowel protocol 09/10/2018  Procedures:  CT abdomen and pelvis 09/09/2018  Antimicrobials:   None   Subjective: Patient complaining of significant nausea.  Patient states may have passed some gas last night however none today.  No bowel movement.  NG tube in place.  No chest pain.  No shortness of breath.   Objective: Vitals:   09/10/18 1335 09/10/18 1503 09/10/18 2218 09/11/18 0536  BP: (!) 163/122 (!) 141/84 (!) 147/85 129/75  Pulse: 94  72 86  Resp: 19  16 17   Temp:   98.1 F (36.7 C) 98.8 F (37.1 C)  TempSrc:   Oral Oral  SpO2:   93% 97%  Weight:      Height:        Intake/Output Summary (Last 24 hours) at 09/11/2018 1132 Last data filed at 09/11/2018 1100 Gross per 24 hour  Intake 2302.45 ml  Output 3250 ml  Net -947.55 ml   Filed Weights   09/10/18 0700  Weight: (!) 188.9 kg    Examination:  General exam: NGT in place. Respiratory system: Lungs clear to auscultation bilaterally anterior lung fields.  No wheezes, no crackles, no rhonchi. Respiratory effort normal. Cardiovascular system: Regular rate rhythm no murmurs rubs or gallops.  No JVD.  No lower extremity edema.  Gastrointestinal system: Abdomen is nondistended, soft, tender to palpation periumbilical region and lower abdomen. Palpable firm incarcerated lower midline abdominal wall hernia noted.  Hypoactive bowel sounds.  No rebound.  No guarding.   Central nervous system: Alert and oriented. No focal neurological deficits. Extremities: Symmetric 5 x 5 power. Skin: No rashes, lesions or ulcers Psychiatry: Judgement and insight appear normal. Mood & affect appropriate.     Data Reviewed: I have personally reviewed following labs and imaging studies  CBC: Recent Labs  Lab 09/09/18 1702 09/10/18 0451 09/10/18 2204 09/11/18 0448  WBC 12.3* 14.3*  13.1* 12.0*  NEUTROABS  --   --   --  8.5*  HGB 16.0* 15.3* 14.6 14.9  HCT 48.1* 46.3* 45.1 46.4*  MCV 91.3 92.0 93.6 92.6  PLT 291 286 253 282   Basic Metabolic Panel: Recent Labs  Lab 09/09/18 1702 09/10/18 0451 09/11/18 0448  NA 139 138 141  K 3.2* 3.4* 3.7  CL 101 102 105  CO2 25 23 25   GLUCOSE 138* 138* 131*  BUN 17 15 14   CREATININE 0.80 0.70 0.87  CALCIUM 9.5 8.8* 8.6*  MG  --  2.3 2.3   GFR: Estimated Creatinine Clearance: 152.1 mL/min (by C-G formula based on SCr of 0.87 mg/dL). Liver Function Tests: Recent Labs  Lab 09/09/18 1702  AST 34  ALT 33  ALKPHOS 42  BILITOT 0.9  PROT 7.7  ALBUMIN 4.4   Recent Labs  Lab 09/09/18 1702  LIPASE 33   No results for input(s): AMMONIA in the last 168 hours. Coagulation Profile: No results for input(s): INR, PROTIME in the last 168 hours. Cardiac Enzymes: No results for input(s): CKTOTAL, CKMB, CKMBINDEX, TROPONINI in the last 168 hours. BNP (last 3 results) No results for input(s): PROBNP in the last 8760 hours. HbA1C: No results for input(s): HGBA1C in the last 72 hours. CBG: Recent Labs  Lab 09/10/18 1655 09/10/18 2125 09/11/18 0029 09/11/18 0407 09/11/18 0733  GLUCAP 126* 136* 117* 124* 124*   Lipid Profile: No results for input(s): CHOL, HDL, LDLCALC, TRIG, CHOLHDL, LDLDIRECT in the last 72 hours. Thyroid Function Tests: No results for input(s): TSH, T4TOTAL, FREET4, T3FREE, THYROIDAB in the last 72 hours. Anemia Panel: No results for input(s): VITAMINB12, FOLATE, FERRITIN, TIBC, IRON, RETICCTPCT in the last 72 hours. Sepsis Labs: No results for input(s): PROCALCITON, LATICACIDVEN in the last 168 hours.  Recent Results (from the past 240 hour(s))  Urine culture     Status: Abnormal (Preliminary result)   Collection Time: 09/09/18  9:14 PM  Result Value Ref Range Status   Specimen Description   Final    URINE, CLEAN CATCH Performed at [facility], [address]  [place], [place] [address]    Special Requests   Final    NONE Performed at [facility], [address] [place], [place] [address]    Culture (A)  Final    >=100,000 COLONIES/mL UNIDENTIFIED ORGANISM Performed at [facility], [address] [place], [place] [address]    Report Status PENDING  Incomplete         Radiology Studies: Dg Abdomen 1 View  Result Date: 09/10/2018 CLINICAL DATA:  NG tube placement. EXAM: ABDOMEN - 1 VIEW COMPARISON:  CT earlier this day. FINDINGS: Tip and side port of the enteric tube below the diaphragm in the stomach. Fluid-filled dilated bowel on CT is not seen by radiograph. Excreted IV contrast in the renal collecting systems from prior CT. IMPRESSION: Tip and side port of the enteric tube below the diaphragm in the stomach. Electronically Signed   By: Narda Rutherford M.D.   On: 09/10/2018 01:15  Ct Abdomen Pelvis W Contrast  Result Date: 09/09/2018 CLINICAL DATA:  Abdominal pain with nausea, vomiting and diarrhea EXAM: CT ABDOMEN AND PELVIS WITH CONTRAST TECHNIQUE: Multidetector CT imaging of the abdomen and pelvis was performed using the standard protocol following bolus administration of intravenous contrast. CONTRAST:  100mL ISOVUE-300 IOPAMIDOL (ISOVUE-300) INJECTION 61% COMPARISON:  None. FINDINGS: Lower chest: Normal heart size without pericardial effusion or thickening. Bibasilar dependent atelectasis without effusion, pulmonary consolidation or pneumothorax. No dominant mass. Hepatobiliary: Steatosis of the liver without biliary nor mass. Normal gallbladder free of stones. Pancreas: Normal Spleen: Normal size spleen with adjacent small splenule. Adrenals/Urinary Tract: Normal bilateral adrenal glands and kidneys. No nephrolithiasis nor obstructive uropathy. The urinary bladder is unremarkable for the degree of distention. Stomach/Bowel: Physiologic distention of the stomach with normal small bowel rotation. Mild fluid-filled  distention jejunal loops to 3.5 cm in caliber secondary to a right periumbilical omental fat and small bowel containing hernia, series 4/75. This is likely contributing to dilatation of small bowel and partial or early SBO. No apparent incarceration. Adjacent to this is another ventral hernia containing small bowel and omental fat. There is fecalized material within the segment of small bowel projecting into the hernia as well as just outside the ventral hernia opening. The transverse dimension of the common hernia orifice is 9.5 cm which than separates into the two separate hernia sacs as above described. No large bowel dilatation or herniation is identified. There is colonic diverticulosis along the descending and sigmoid colon. Vascular/Lymphatic: No significant vascular findings are present. No enlarged abdominal or pelvic lymph nodes. Reproductive: Uterus and bilateral adnexa are unremarkable. Other: No free air or free fluid. Subcutaneous soft tissue induration of the included pannus. Musculoskeletal: Degenerative disc disease L4-5 and L5-S1. Facet arthropathy L3 through S1. IMPRESSION: 1. Mild fluid-filled distention of jejunal loops up to 3.5 cm secondary to a right periumbilical omental fat and small bowel containing hernia. This is likely contributing to dilatation of small bowel. No incarceration is identified. Adjacent to this is another ventral hernia containing small bowel and omental fat with small amount of fecalized material noted. This too is likely contributing to early or partial SBO. 2. Hepatic steatosis. 3. Lower lumbar degenerative disc disease L4-5 and L5-S1. Electronically Signed   By: Tollie Ethavid  Kwon M.D.   On: 09/09/2018 22:41   Dg Abd Portable 1v-small Bowel Obstruction Protocol-initial, 8 Hr Delay  Result Date: 09/10/2018 CLINICAL DATA:  Small-bowel obstruction EXAM: PORTABLE ABDOMEN - 1 VIEW COMPARISON:  09/09/2018 FINDINGS: Gastric catheter is noted within the stomach which is  decompressed. Multiple dilated loops of small bowel are again identified similar to that seen on the prior exam. No contrast material is identified. Correlate with clinical history. The overall appearance is similar to that seen on recent CT examination. IMPRESSION: Stable small bowel dilatation predominately on the left similar to that seen on recent CT. No contrast material is noted despite the given clinical history. Clinical correlation is recommended. Electronically Signed   By: Alcide CleverMark  Lukens M.D.   On: 09/10/2018 18:46        Scheduled Meds: . enoxaparin (LOVENOX) injection  80 mg Subcutaneous Q24H  . insulin aspart  0-9 Units Subcutaneous Q4H  . metoprolol tartrate  5 mg Intravenous Q8H  . prochlorperazine  10 mg Intravenous Once   Continuous Infusions: . sodium chloride 125 mL/hr at 09/11/18 0950  . famotidine (PEPCID) IV 20 mg (09/11/18 0946)  . ondansetron (ZOFRAN) IV    . potassium chloride 10  mEq (09/11/18 1126)     LOS: 2 days    Time spent: 35 minutes    Ramiro Harvest, MD [facility] Pager [phone]  If 7PM-7AM, please contact night-coverage [email or web address] Password [facility] 09/11/2018, 11:32 AM

## 2018-09-11 NOTE — Telephone Encounter (Signed)
Refill request from pharmacy for Aldactone 50mg  tab. To be filled with [facility].   Please send refill if approved.

## 2018-09-12 LAB — URINE CULTURE: Culture: >=100000 — AB

## 2018-09-12 LAB — BASIC METABOLIC PANEL
Anion gap: 11 (ref 5–15)
BUN: 15 mg/dL (ref 6–20)
CALCIUM: 8.7 mg/dL — AB (ref 8.9–10.3)
CO2: 23 mmol/L (ref 22–32)
Chloride: 107 mmol/L (ref 98–111)
Creatinine, Ser: 0.66 mg/dL (ref 0.44–1.00)
GFR calc Af Amer: >60 mL/min (ref >60)
GFR calc non Af Amer: >60 mL/min (ref >60)
Glucose, Bld: 123 mg/dL — ABNORMAL HIGH (ref 70–99)
Potassium: 3.1 mmol/L — ABNORMAL LOW (ref 3.5–5.1)
Sodium: 141 mmol/L (ref 135–145)

## 2018-09-12 LAB — CBC
HCT: 43.9 % (ref 36.0–46.0)
Hemoglobin: 14.3 g/dL (ref 12.0–15.0)
MCH: 30.6 pg (ref 26.0–34.0)
MCHC: 32.6 g/dL (ref 30.0–36.0)
MCV: 94 fL (ref 80.0–100.0)
NRBC: 0 % (ref 0.0–0.2)
Platelets: 249 10*3/uL (ref 150–400)
RBC: 4.67 MIL/uL (ref 3.87–5.11)
RDW: 13.3 % (ref 11.5–15.5)
WBC: 14.1 10*3/uL — ABNORMAL HIGH (ref 4.0–10.5)

## 2018-09-12 LAB — GLUCOSE, CAPILLARY
GLUCOSE-CAPILLARY: 122 mg/dL — AB (ref 70–99)
Glucose-Capillary: 103 mg/dL — ABNORMAL HIGH (ref 70–99)
Glucose-Capillary: 108 mg/dL — ABNORMAL HIGH (ref 70–99)
Glucose-Capillary: 111 mg/dL — ABNORMAL HIGH (ref 70–99)
Glucose-Capillary: 117 mg/dL — ABNORMAL HIGH (ref 70–99)
Glucose-Capillary: 129 mg/dL — ABNORMAL HIGH (ref 70–99)
Glucose-Capillary: 131 mg/dL — ABNORMAL HIGH (ref 70–99)

## 2018-09-12 LAB — MAGNESIUM: Magnesium: 2.4 mg/dL (ref 1.7–2.4)

## 2018-09-12 MED ORDER — POTASSIUM CHLORIDE 10 MEQ/100ML IV SOLN
10.0000 meq | INTRAVENOUS | Status: AC
  Administered 2018-09-12 (×5): 10 meq via INTRAVENOUS
  Filled 2018-09-12: qty 100

## 2018-09-12 MED ORDER — SODIUM CHLORIDE 0.9 % IV SOLN: INTRAVENOUS | Status: DC

## 2018-09-12 MED ORDER — POTASSIUM CHLORIDE IN NACL 40-0.9 MEQ/L-% IV SOLN
INTRAVENOUS | Status: DC
  Administered 2018-09-12 – 2018-09-14 (×5): 100 mL/h via INTRAVENOUS
  Administered 2018-09-15 – 2018-09-16 (×2): 75 mL/h via INTRAVENOUS
  Filled 2018-09-12 (×10): qty 1000

## 2018-09-12 NOTE — Progress Notes (Signed)
NAYSA PUSKAS [id number] Jun 24, 1971  CARE TEAM:  PCP: Boneta Lucks, NP  Outpatient Care Team: Patient Care Team: Boneta Lucks, NP as PCP - General (Nurse Practitioner)  Inpatient Treatment Team: Treatment Team: Attending Provider: Rodolph Bong, MD; Rounding Team: Montez Morita, Md, MD; Rounding Team: Delmer Islam, MD; Technician: Kela Millin, [place]; Registered Nurse: Sabino Snipes, RN; Registered Nurse: Pincus Badder, RN; Registered Nurse: Hilliard Clark, RN; Technician: Beatris Ship, NT; Registered Nurse: Quentin Cornwall, RN   Problem List:   Principal Problem:   SBO (small bowel obstruction) [facility]) Active Problems:   High blood pressure associated with diabetes (HCC)   Periumbilical hernia   Ventral hernia   Generalized abdominal pain   Type 2 diabetes mellitus without complication, without long-term current use of insulin (HCC)   Depression   Anxiety      * No surgery found *      Assessment  Morbidly obese, BMI 58 OSA PCOS HTN DM  SBO with periumbilical hernia   [facility] Stay = 3 days)  Plan:  SBO with periumbilical hernia  Patient feels better overall with much less pain.  She is hungry.  Some flatus and decreased NG tube output despite having 500 mL of ice chips a shift.  We will try a nasogastric tube clamping trial and give her low-volume clears.  If she tolerates, remove NG tube tomorrow and plan outpatient elective surgical repair when she is in better shape.  She would require a large sheet of mesh to help patch this region up.  If she does not improve or worsen, she will require hernia repair this admission.  Can try and do laparoscopically but a challenge in a super morbidly obese woman.  FEN -IV fluids for now  VTE - Lovenox  ID -no infection - not currently needed Sleep apnea.  Challenged to use CPAP machine with nasogastric tube in place.  I asked if patient can bring her mask and machine to work with respiratory  therapy to find a way.  Hopefully we can get the NG tube out soon and this will be less of an issue.  mobilize as tolerated to help recovery.  She wishes to get up more is actually quite mobile and active..  Try and get bariatric commode and chair for her superobesity.  20 minutes spent in review, evaluation, examination, counseling, and coordination of care.  More than 50% of that time was spent in counseling.  09/12/2018    Subjective: (Chief complaint)  Feels much better overall.   Pain pretty much gone away Hungry.   Passing gas.   Been eating 2-3 cups of ice chips shift.  Wanting to get up more but chair in room narrow and not working.  Friend in room.  Objective:  Vital signs:  Vitals:   09/10/18 2218 09/11/18 0536 09/11/18 2125 09/12/18 0408  BP: (!) 147/85 129/75 (!) 158/76 (!) 145/81  Pulse: 72 86 80 87  Resp: 16 17 15 16   Temp: 98.1 F (36.7 C) 98.8 F (37.1 C) 98.2 F (36.8 C) 97.9 F (36.6 C)  TempSrc: Oral Oral Oral Oral  SpO2: 93% 97% 95% 95%  Weight:      Height:        Last BM Date: 09/07/18  Intake/Output   Yesterday:  01/10 0701 - 01/11 0700 In: 3722.5 [I.V.:2903.5; NG/GT:60; IV Piggyback:759] Out: 3050 [Urine:600; Emesis/NG output:2450] This shift:  No intake/output data recorded.  Bowel function:  Flatus: YES  BM:  No  Drain: Nasogastric tube with thinly bilious fluid.   Physical Exam:  General: Pt awake/alert/oriented x4 in no acute distress.  Smiling.  Chatty.  Sitting up and moving around quite easily. Eyes: PERRL, normal EOM.  Sclera clear.  No icterus Neuro: CN II-XII intact w/o focal sensory/motor deficits. Lymph: No head/neck/groin lymphadenopathy Psych:  No delerium/psychosis/paranoia HENT: Normocephalic, Mucus membranes moist.  No thrush Neck: Supple, No tracheal deviation Chest: No chest wall pain w good excursion CV:  Pulses intact.  Regular rhythm MS: Normal AROM mjr joints.  No obvious deformity  Abdomen: Soft.   Nondistended.  9 x 9 super umbilical right-sided mass consistent with recurrent incarcerated umbilical ventral hernia.  No pain or discomfort though.  Not reducible..  No evidence of peritonitis.  No incarcerated hernias.  Ext:  No deformity.  No mjr edema.  No cyanosis Skin: No petechiae / purpura  Results:   Labs: Results for orders placed or performed during the hospital encounter of 09/09/18 (from the past 48 hour(s))  Glucose, capillary     Status: Abnormal   Collection Time: 09/10/18 11:29 AM  Result Value Ref Range   Glucose-Capillary 116 (H) 70 - 99 mg/dL  Glucose, capillary     Status: Abnormal   Collection Time: 09/10/18  4:55 PM  Result Value Ref Range   Glucose-Capillary 126 (H) 70 - 99 mg/dL  Glucose, capillary     Status: Abnormal   Collection Time: 09/10/18  9:25 PM  Result Value Ref Range   Glucose-Capillary 136 (H) 70 - 99 mg/dL  CBC     Status: Abnormal   Collection Time: 09/10/18 10:04 PM  Result Value Ref Range   WBC 13.1 (H) 4.0 - 10.5 K/uL   RBC 4.82 3.87 - 5.11 MIL/uL   Hemoglobin 14.6 12.0 - 15.0 g/dL   HCT 22.9 79.8 - 92.1 %   MCV 93.6 80.0 - 100.0 fL   MCH 30.3 26.0 - 34.0 pg   MCHC 32.4 30.0 - 36.0 g/dL   RDW 19.4 17.4 - 08.1 %   Platelets 253 150 - 400 K/uL   nRBC 0.0 0.0 - 0.2 %    Comment: Performed at [facility] [address] [place], [place] [address]  Glucose, capillary     Status: Abnormal   Collection Time: 09/11/18 12:29 AM  Result Value Ref Range   Glucose-Capillary 117 (H) 70 - 99 mg/dL  Glucose, capillary     Status: Abnormal   Collection Time: 09/11/18  4:07 AM  Result Value Ref Range   Glucose-Capillary 124 (H) 70 - 99 mg/dL  CBC with Differential/Platelet     Status: Abnormal   Collection Time: 09/11/18  4:48 AM  Result Value Ref Range   WBC 12.0 (H) 4.0 - 10.5 K/uL   RBC 5.01 3.87 - 5.11 MIL/uL   Hemoglobin 14.9 12.0 - 15.0 g/dL   HCT 56.3 (H) 14.9 - 70.2 %   MCV 92.6 80.0 - 100.0 fL   MCH 29.7 26.0  - 34.0 pg   MCHC 32.1 30.0 - 36.0 g/dL   RDW 63.7 85.8 - 85.0 %   Platelets 282 150 - 400 K/uL   nRBC 0.0 0.0 - 0.2 %   Neutrophils Relative % 70 %   Neutro Abs 8.5 (H) 1.7 - 7.7 K/uL   Lymphocytes Relative 18 %   Lymphs Abs 2.1 0.7 - 4.0 K/uL   Monocytes Relative 9 %   Monocytes Absolute 1.1 (H) 0.1 - 1.0 K/uL  Eosinophils Relative 1 %   Eosinophils Absolute 0.1 0.0 - 0.5 K/uL   Basophils Relative 1 %   Basophils Absolute 0.1 0.0 - 0.1 K/uL   Immature Granulocytes 1 %   Abs Immature Granulocytes 0.07 0.00 - 0.07 K/uL    Comment: Performed at [facility], [address] [place], [place] [address]  Basic metabolic panel     Status: Abnormal   Collection Time: 09/11/18  4:48 AM  Result Value Ref Range   Sodium 141 135 - 145 mmol/L   Potassium 3.7 3.5 - 5.1 mmol/L   Chloride 105 98 - 111 mmol/L   CO2 25 22 - 32 mmol/L   Glucose, Bld 131 (H) 70 - 99 mg/dL   BUN 14 6 - 20 mg/dL   Creatinine, Ser 5.400.87 0.44 - 1.00 mg/dL   Calcium 8.6 (L) 8.9 - 10.3 mg/dL   GFR calc non Af Amer >60 >60 mL/min   GFR calc Af Amer >60 >60 mL/min   Anion gap 11 5 - 15    Comment: Performed at [facility], [address] [place], [place] [address]  Magnesium     Status: None   Collection Time: 09/11/18  4:48 AM  Result Value Ref Range   Magnesium 2.3 1.7 - 2.4 mg/dL    Comment: Performed at [facility], [address] [place], [place] [address]  Glucose, capillary     Status: Abnormal   Collection Time: 09/11/18  7:33 AM  Result Value Ref Range   Glucose-Capillary 124 (H) 70 - 99 mg/dL  Glucose, capillary     Status: Abnormal   Collection Time: 09/11/18 11:51 AM  Result Value Ref Range   Glucose-Capillary 138 (H) 70 - 99 mg/dL  Glucose, capillary     Status: Abnormal   Collection Time: 09/11/18  4:49 PM  Result Value Ref Range   Glucose-Capillary 128 (H) 70 - 99 mg/dL  Glucose, capillary     Status: Abnormal   Collection Time:  09/11/18  7:28 PM  Result Value Ref Range   Glucose-Capillary 123 (H) 70 - 99 mg/dL  Glucose, capillary     Status: Abnormal   Collection Time: 09/12/18 12:04 AM  Result Value Ref Range   Glucose-Capillary 117 (H) 70 - 99 mg/dL  Glucose, capillary     Status: Abnormal   Collection Time: 09/12/18  4:10 AM  Result Value Ref Range   Glucose-Capillary 111 (H) 70 - 99 mg/dL  CBC     Status: Abnormal   Collection Time: 09/12/18  4:42 AM  Result Value Ref Range   WBC 14.1 (H) 4.0 - 10.5 K/uL   RBC 4.67 3.87 - 5.11 MIL/uL   Hemoglobin 14.3 12.0 - 15.0 g/dL   HCT 95.643.9 21.336.0 - 08.646.0 %   MCV 94.0 80.0 - 100.0 fL   MCH 30.6 26.0 - 34.0 pg   MCHC 32.6 30.0 - 36.0 g/dL   RDW 57.813.3 46.911.5 - 62.915.5 %   Platelets 249 150 - 400 K/uL   nRBC 0.0 0.0 - 0.2 %    Comment: Performed at [facility], [address] [place], [place] [address]  Basic metabolic panel     Status: Abnormal   Collection Time: 09/12/18  4:42 AM  Result Value Ref Range   Sodium 141 135 - 145 mmol/L   Potassium 3.1 (L) 3.5 - 5.1 mmol/L   Chloride 107 98 - 111 mmol/L   CO2 23 22 -  32 mmol/L   Glucose, Bld 123 (H) 70 - 99 mg/dL   BUN 15 6 - 20 mg/dL   Creatinine, Ser 1.610.66 0.44 - 1.00 mg/dL   Calcium 8.7 (L) 8.9 - 10.3 mg/dL   GFR calc non Af Amer >60 >60 mL/min   GFR calc Af Amer >60 >60 mL/min   Anion gap 11 5 - 15    Comment: Performed at [facility], [address] [place], [place] [address]    Imaging / Studies: Dg Abd Portable 1v-small Bowel Obstruction Protocol-initial, 8 Hr Delay  Result Date: 09/11/2018 CLINICAL DATA:  8 hour delayed imaging after small bowel protocol. Contrast given at 11:30 a.m. EXAM: PORTABLE ABDOMEN - 1 VIEW COMPARISON:  05/23/2019 FINDINGS: Dilated small bowel loops are noted in the left hemiabdomen without contrast. Enteric contrast is poorly visualized but is noted within proximal small bowel loops. The gas-filled loops of dilated small bowel in the left  hemiabdomen measure up to 3.8 cm caliber. IMPRESSION: Dilated loops of small bowel in the left hemiabdomen without contrast. Findings may reflect small bowel obstruction or ileus. Electronically Signed   By: Tollie Ethavid  Kwon M.D.   On: 09/11/2018 17:47   Dg Abd Portable 1v-small Bowel Obstruction Protocol-initial, 8 Hr Delay  Result Date: 09/10/2018 CLINICAL DATA:  Small-bowel obstruction EXAM: PORTABLE ABDOMEN - 1 VIEW COMPARISON:  09/09/2018 FINDINGS: Gastric catheter is noted within the stomach which is decompressed. Multiple dilated loops of small bowel are again identified similar to that seen on the prior exam. No contrast material is identified. Correlate with clinical history. The overall appearance is similar to that seen on recent CT examination. IMPRESSION: Stable small bowel dilatation predominately on the left similar to that seen on recent CT. No contrast material is noted despite the given clinical history. Clinical correlation is recommended. Electronically Signed   By: Alcide CleverMark  Lukens M.D.   On: 09/10/2018 18:46    Medications / Allergies: per chart  Antibiotics: Anti-infectives (From admission, onward)   None        Note: Portions of this report may have been transcribed using voice recognition software. Every effort was made to ensure accuracy; however, inadvertent computerized transcription errors may be present.   Any transcriptional errors that result from this process are unintentional.     Ardeth SportsmanSteven C. Arend Bahl, MD, FACS, MASCRS Gastrointestinal and Minimally Invasive Surgery    [address] [place], [place] [address] [phone] Main / Paging [phone] Fax

## 2018-09-12 NOTE — Plan of Care (Addendum)
Patient in bed this morning. NG tube clamped; Assist up to chair. No complaints or concerns voiced at this time. Will continue to monitor.

## 2018-09-12 NOTE — Progress Notes (Addendum)
PROGRESS NOTE    Kelly Vargas  RFX:[id number] DOB: Feb 08, 1971 DOA: 09/09/2018 PCP: Boneta Lucks, NP    Brief Narrative:   Kelly Vargas is a 48 y.o. female with medical history significant of PCOS, DM, HTN.  Patient presents to the ED for evaluation of abd pain, N/V.  Symptoms onset 4 days ago, worsening.  Unable to tolerate anything PO.  Does report episodes of diarrhea.   ED Course: CT reveals PSBO likely due to 2 separate hernias contributing (periumbilical and ventral hernias).   Assessment & Plan:   Principal Problem:   SBO (small bowel obstruction) (HCC) Active Problems:   High blood pressure associated with diabetes (HCC)   Periumbilical hernia   Ventral hernia   Generalized abdominal pain   Type 2 diabetes mellitus without complication, without long-term current use of insulin (HCC)   Depression   Anxiety  1 small bowel obstruction versus partial small bowel obstruction Secondary to 2 hernias.  NG tube in place.  Patient has been seen in consultation by general surgery who are recommending conservative management at this time as patient will be difficult operative repair with high likelihood of eventual failure.  IV fluids, supportive care, mobilize.  Patient with no bowel movement.  Complaint of nausea.  Small bowel protocol ordered per general surgery.  Patient started on clears this morning per general surgery and patient for clamping trial today.  Keep magnesium greater than 2.  Keep potassium greater than 4.  General surgery following and appreciate input and recommendations.  2.  Obstructive sleep apnea BiPAP nightly on hold due to NG tube.  3.  Diabetes mellitus type 2 Oral hypoglycemic agents on hold.  CBG 122 this morning.  Continue sliding scale insulin.  4.  Hypertension Continue to hold HCTZ and spironolactone.  Continue IV Lopressor.  Follow.  5.  Depression/anxiety Patient with small bowel obstruction.  Has just been started on some clears per  general surgery.  Continue to hold Cymbalta and Lexapro.  IV Ativan as needed.    6.  Hypokalemia Replete.   DVT prophylaxis: Lovenox Code Status: Full Family Communication: Updated patient.  No family at bedside.  Disposition Plan: Home once clinically improved, resolution of small bowel obstruction and per general surgery.   Consultants:   General surgery: Dr. Sheliah Hatch 09/09/2018  Small bowel protocol 09/10/2018  Procedures:  CT abdomen and pelvis 09/09/2018  Antimicrobials:   None   Subjective: Patient sitting up in bed with some clear liquids.  Feeling better.  Denies any emesis.  Feels nausea may be improving.  Passing some flatus.  Denies any bowel movement but feels she may have a bowel movement soon.   Objective: Vitals:   09/10/18 2218 09/11/18 0536 09/11/18 2125 09/12/18 0408  BP: (!) 147/85 129/75 (!) 158/76 (!) 145/81  Pulse: 72 86 80 87  Resp: 16 17 15 16   Temp: 98.1 F (36.7 C) 98.8 F (37.1 C) 98.2 F (36.8 C) 97.9 F (36.6 C)  TempSrc: Oral Oral Oral Oral  SpO2: 93% 97% 95% 95%  Weight:      Height:        Intake/Output Summary (Last 24 hours) at 09/12/2018 1352 Last data filed at 09/12/2018 1148 Gross per 24 hour  Intake 3942.51 ml  Output 3450 ml  Net 492.51 ml   Filed Weights   09/10/18 0700  Weight: (!) 188.9 kg    Examination:  General exam: NGT in place. Respiratory system: CTAB.  No wheezes, no crackles, no  rhonchi.  Normal respiratory effort.  Cardiovascular system: RRR no murmurs rubs or gallops.  No JVD.  No lower extremity edema.  Distant heart sounds secondary to body habitus.  Gastrointestinal system: Abdomen is soft, nontender to palpation, nondistended, positive bowel sounds.  Palpable firm incarcerated lower midline abdominal wall hernia noted.  Hypoactive bowel sounds.  No rebound.  No guarding.   Central nervous system: Alert and oriented. No focal neurological deficits. Extremities: Symmetric 5 x 5 power. Skin: No rashes,  lesions or ulcers Psychiatry: Judgement and insight appear normal. Mood & affect appropriate.     Data Reviewed: I have personally reviewed following labs and imaging studies  CBC: Recent Labs  Lab 09/09/18 1702 09/10/18 0451 09/10/18 2204 09/11/18 0448 09/12/18 0442  WBC 12.3* 14.3* 13.1* 12.0* 14.1*  NEUTROABS  --   --   --  8.5*  --   HGB 16.0* 15.3* 14.6 14.9 14.3  HCT 48.1* 46.3* 45.1 46.4* 43.9  MCV 91.3 92.0 93.6 92.6 94.0  PLT 291 286 253 282 249   Basic Metabolic Panel: Recent Labs  Lab 09/09/18 1702 09/10/18 0451 09/11/18 0448 09/12/18 0442  NA 139 138 141 141  K 3.2* 3.4* 3.7 3.1*  CL 101 102 105 107  CO2 25 23 25 23   GLUCOSE 138* 138* 131* 123*  BUN 17 15 14 15   CREATININE 0.80 0.70 0.87 0.66  CALCIUM 9.5 8.8* 8.6* 8.7*  MG  --  2.3 2.3 2.4   GFR: Estimated Creatinine Clearance: 165.4 mL/min (by C-G formula based on SCr of 0.66 mg/dL). Liver Function Tests: Recent Labs  Lab 09/09/18 1702  AST 34  ALT 33  ALKPHOS 42  BILITOT 0.9  PROT 7.7  ALBUMIN 4.4   Recent Labs  Lab 09/09/18 1702  LIPASE 33   No results for input(s): AMMONIA in the last 168 hours. Coagulation Profile: No results for input(s): INR, PROTIME in the last 168 hours. Cardiac Enzymes: No results for input(s): CKTOTAL, CKMB, CKMBINDEX, TROPONINI in the last 168 hours. BNP (last 3 results) No results for input(s): PROBNP in the last 8760 hours. HbA1C: No results for input(s): HGBA1C in the last 72 hours. CBG: Recent Labs  Lab 09/11/18 1928 09/12/18 0004 09/12/18 0410 09/12/18 0740 09/12/18 1225  GLUCAP 123* 117* 111* 122* 131*   Lipid Profile: No results for input(s): CHOL, HDL, LDLCALC, TRIG, CHOLHDL, LDLDIRECT in the last 72 hours. Thyroid Function Tests: No results for input(s): TSH, T4TOTAL, FREET4, T3FREE, THYROIDAB in the last 72 hours. Anemia Panel: No results for input(s): VITAMINB12, FOLATE, FERRITIN, TIBC, IRON, RETICCTPCT in the last 72 hours. Sepsis  Labs: No results for input(s): PROCALCITON, LATICACIDVEN in the last 168 hours.  Recent Results (from the past 240 hour(s))  Urine culture     Status: Abnormal   Collection Time: 09/09/18  9:14 PM  Result Value Ref Range Status   Specimen Description URINE, CLEAN CATCH  Final   Special Requests   Final    NONE Performed at [facility], [address], [place], [place] [address]    Culture (A)  Final    >=100,000 COLONIES/mL MULTIPLE SPECIES PRESENT, SUGGEST RECOLLECTION   Report Status 09/12/2018 FINAL  Final         Radiology Studies: Dg Abd Portable 1v-small Bowel Obstruction Protocol-initial, 8 Hr Delay  Result Date: 09/11/2018 CLINICAL DATA:  8 hour delayed imaging after small bowel protocol. Contrast given at 11:30 a.m. EXAM: PORTABLE ABDOMEN - 1 VIEW COMPARISON:  05/23/2019 FINDINGS:  Dilated small bowel loops are noted in the left hemiabdomen without contrast. Enteric contrast is poorly visualized but is noted within proximal small bowel loops. The gas-filled loops of dilated small bowel in the left hemiabdomen measure up to 3.8 cm caliber. IMPRESSION: Dilated loops of small bowel in the left hemiabdomen without contrast. Findings may reflect small bowel obstruction or ileus. Electronically Signed   By: Tollie Ethavid  Kwon M.D.   On: 09/11/2018 17:47   Dg Abd Portable 1v-small Bowel Obstruction Protocol-initial, 8 Hr Delay  Result Date: 09/10/2018 CLINICAL DATA:  Small-bowel obstruction EXAM: PORTABLE ABDOMEN - 1 VIEW COMPARISON:  09/09/2018 FINDINGS: Gastric catheter is noted within the stomach which is decompressed. Multiple dilated loops of small bowel are again identified similar to that seen on the prior exam. No contrast material is identified. Correlate with clinical history. The overall appearance is similar to that seen on recent CT examination. IMPRESSION: Stable small bowel dilatation predominately on the left similar to that seen on recent CT. No contrast  material is noted despite the given clinical history. Clinical correlation is recommended. Electronically Signed   By: Alcide CleverMark  Lukens M.D.   On: 09/10/2018 18:46        Scheduled Meds: . enoxaparin (LOVENOX) injection  80 mg Subcutaneous Q24H  . insulin aspart  0-9 Units Subcutaneous Q4H  . metoprolol tartrate  5 mg Intravenous Q8H   Continuous Infusions: . 0.9 % NaCl with KCl 40 mEq / L 100 mL/hr (09/12/18 1024)  . famotidine (PEPCID) IV 20 mg (09/12/18 1026)  . ondansetron [facility](ZOFRAN) IV 8 mg (09/12/18 1107)  . potassium chloride 10 mEq (09/12/18 1100)     LOS: 3 days    Time spent: 35 minutes    Ramiro Harvestaniel Thompson, MD [facility]  If 7PM-7AM, please contact night-coverage [email or web address] 09/12/2018, 1:52 PM

## 2018-09-12 NOTE — Progress Notes (Signed)
NG tube clamped x 1 hour with no nausea initially. Limited PO intake: few ice chips and sips of water. Patient beginning to feel slight nausea after 1 hour of clamping and wanted to be reconnected to suction. No complaints of pain currently. + flatus; has been up to bathroom and feels like she needs to have a BM but has not had one yet. Will continue to monitor.

## 2018-09-13 ENCOUNTER — Inpatient Hospital Stay (HOSPITAL_COMMUNITY): Payer: 59

## 2018-09-13 LAB — COMPREHENSIVE METABOLIC PANEL
ALT: 44 U/L (ref 0–44)
ANION GAP: 9 (ref 5–15)
AST: 43 U/L — ABNORMAL HIGH (ref 15–41)
Albumin: 3.9 g/dL (ref 3.5–5.0)
Alkaline Phosphatase: 42 U/L (ref 38–126)
BILIRUBIN TOTAL: 1.1 mg/dL (ref 0.3–1.2)
BUN: 15 mg/dL (ref 6–20)
CO2: 23 mmol/L (ref 22–32)
Calcium: 8.8 mg/dL — ABNORMAL LOW (ref 8.9–10.3)
Chloride: 107 mmol/L (ref 98–111)
Creatinine, Ser: 0.66 mg/dL (ref 0.44–1.00)
GFR calc Af Amer: >60 mL/min (ref >60)
GFR calc non Af Amer: >60 mL/min (ref >60)
Glucose, Bld: 133 mg/dL — ABNORMAL HIGH (ref 70–99)
POTASSIUM: 3.6 mmol/L (ref 3.5–5.1)
Sodium: 139 mmol/L (ref 135–145)
Total Protein: 7.1 g/dL (ref 6.5–8.1)

## 2018-09-13 LAB — CBC WITH DIFFERENTIAL/PLATELET
Abs Immature Granulocytes: 0.08 10*3/uL — ABNORMAL HIGH (ref 0.00–0.07)
BASOS PCT: 1 %
Basophils Absolute: 0.1 10*3/uL (ref 0.0–0.1)
EOS ABS: 0.3 10*3/uL (ref 0.0–0.5)
Eosinophils Relative: 2 %
HEMATOCRIT: 45.8 % (ref 36.0–46.0)
Hemoglobin: 14.8 g/dL (ref 12.0–15.0)
Immature Granulocytes: 1 %
LYMPHS ABS: 2.2 10*3/uL (ref 0.7–4.0)
Lymphocytes Relative: 15 %
MCH: 29.4 pg (ref 26.0–34.0)
MCHC: 32.3 g/dL (ref 30.0–36.0)
MCV: 90.9 fL (ref 80.0–100.0)
Monocytes Absolute: 1 10*3/uL (ref 0.1–1.0)
Monocytes Relative: 7 %
Neutro Abs: 10.8 10*3/uL — ABNORMAL HIGH (ref 1.7–7.7)
Neutrophils Relative %: 74 %
PLATELETS: 281 10*3/uL (ref 150–400)
RBC: 5.04 MIL/uL (ref 3.87–5.11)
RDW: 13.1 % (ref 11.5–15.5)
WBC: 14.6 10*3/uL — ABNORMAL HIGH (ref 4.0–10.5)
nRBC: 0 % (ref 0.0–0.2)

## 2018-09-13 LAB — GLUCOSE, CAPILLARY
GLUCOSE-CAPILLARY: 133 mg/dL — AB (ref 70–99)
Glucose-Capillary: 121 mg/dL — ABNORMAL HIGH (ref 70–99)
Glucose-Capillary: 102 mg/dL — ABNORMAL HIGH (ref 70–99)
Glucose-Capillary: 123 mg/dL — ABNORMAL HIGH (ref 70–99)
Glucose-Capillary: 136 mg/dL — ABNORMAL HIGH (ref 70–99)

## 2018-09-13 LAB — PROTIME-INR
INR: 1.1
Prothrombin Time: 14.1 seconds (ref 11.4–15.2)

## 2018-09-13 LAB — MAGNESIUM: Magnesium: 2.2 mg/dL (ref 1.7–2.4)

## 2018-09-13 NOTE — Plan of Care (Signed)
Patient sitting up in chair this morning. Slight complaints of nausea. States had NG clamped for 3 hours early this morning; now hooked back to LIWS. Complains of pain currently and pain medicine given. Patient states did have BM this morning and has had + flatus. Encouraged patient to attempt NG clamp again and ambulate around unit as much as possible. Patient verbalizes understanding and agrees she needs to get up and move as much as possible. Will continue to monitor.

## 2018-09-13 NOTE — Progress Notes (Signed)
Pt had BM and tolerated 3 hours of NGT clamped before needing ILWS to be hooked back up and IVPB zofran. Pt currently resting comfortably and wants to ambulate in hall today.

## 2018-09-13 NOTE — Progress Notes (Signed)
PROGRESS NOTE    KENI HETZLER  NZV:[id number] DOB: 15-May-1971 DOA: 09/09/2018 PCP: Boneta Lucks, NP    Brief Narrative:   MADELLA CAMINERO is a 48 y.o. female with medical history significant of PCOS, DM, HTN.  Patient presents to the ED for evaluation of abd pain, N/V.  Symptoms onset 4 days ago, worsening.  Unable to tolerate anything PO.  Does report episodes of diarrhea.   ED Course: CT reveals PSBO likely due to 2 separate hernias contributing (periumbilical and ventral hernias).   Assessment & Plan:   Principal Problem:   Recurrent incisional hernia with incarceration Active Problems:   Morbid obesity with BMI of 50.0-59.9, adult (HCC)   High blood pressure associated with diabetes (HCC)   SBO (small bowel obstruction) (HCC)   Generalized abdominal pain   Type 2 diabetes mellitus without complication, without long-term current use of insulin (HCC)   Depression   Anxiety  1 small bowel obstruction versus partial small bowel obstruction Secondary to 2 hernias.  NG tube in place.  Patient has been seen in consultation by general surgery who are recommending conservative management at this time as patient will be difficult operative repair with high likelihood of eventual failure.  IV fluids, supportive care, mobilize.  Patient noted to have some nausea overnight with some abdominal pain however patient feels it was due to NG tube being clogged and after manipulation of NG tube feels better.  Keep potassium greater than 4.  Keep magnesium greater than 2.  Small bowel protocol ordered per general surgery.  Patient states had a bowel movement last night and feels partly contrast she received earlier on.  Patient seen by general surgery early on this morning and noted to be with abdominal pain and not improving clinically and surgery was offered however patient wanted a second opinion and as such another surgeon will be assessing the patient for further evaluation.  Continue current  conservative treatment.  Per general surgery.    2.  Obstructive sleep apnea BiPAP nightly on hold due to NG tube.  3.  Diabetes mellitus type 2 Oral hypoglycemic agents on hold.  CBG 102 this morning.  Continue sliding scale insulin.  4.  Hypertension Continue to hold HCTZ and spironolactone.  Continue IV Lopressor.  Follow.  5.  Depression/anxiety Patient with small bowel obstruction.  Has just been started on some clears per general surgery.  Continue to hold Cymbalta and Lexapro.  IV Ativan as needed.    6.  Hypokalemia Repleted.   DVT prophylaxis: Lovenox Code Status: Full Family Communication: Updated patient and wife at bedside. Disposition Plan: Home once clinically improved, resolution of small bowel obstruction and per general surgery.   Consultants:   General surgery: Dr. Sheliah Hatch 09/09/2018  Small bowel protocol 09/10/2018  Procedures:  CT abdomen and pelvis 09/09/2018  Antimicrobials:   None   Subjective: Patient sitting up in chair.  Patient states she is feeling better than early on this morning.  Patient states she felt NG tube may have been clogged and after RN manipulated it nausea has improved and feeling better than she did this morning.  States has a very small bowel movement yesterday night.  Passing some flatus.   Objective: Vitals:   09/12/18 1410 09/12/18 1450 09/12/18 2144 09/13/18 0515  BP: (!) 139/59 133/79 (!) 157/93 (!) 153/75  Pulse: 76 78 70 82  Resp: 16 18 16 16   Temp: 98.3 F (36.8 C) 99.6 F (37.6 C) 98.7 F (37.1 C)  98.9 F (37.2 C)  TempSrc:  Oral Oral Oral  SpO2: 100% 94% 94% 92%  Weight:      Height:        Intake/Output Summary (Last 24 hours) at 09/13/2018 0859 Last data filed at 09/13/2018 0515 Gross per 24 hour  Intake 5110.42 ml  Output 3750 ml  Net 1360.42 ml   Filed Weights   09/10/18 0700  Weight: (!) 188.9 kg    Examination:  General exam: NGT in place. Respiratory system: Lungs clear to auscultation  bilaterally.  No wheezes, no crackles, no rhonchi.  Normal respiratory effort.  Cardiovascular system: Regular rate rhythm no murmurs rubs or gallops.  No JVD.  No lower extremity edema.  Gastrointestinal system: Abdomen is soft, some tenderness to palpation, nondistended, hypoactive bowel sounds. Palpable firm incarcerated lower midline abdominal wall hernia noted. No rebound.  No guarding.   Central nervous system: Alert and oriented. No focal neurological deficits. Extremities: Symmetric 5 x 5 power. Skin: No rashes, lesions or ulcers Psychiatry: Judgement and insight appear normal. Mood & affect appropriate.     Data Reviewed: I have personally reviewed following labs and imaging studies  CBC: Recent Labs  Lab 09/09/18 1702 09/10/18 0451 09/10/18 2204 09/11/18 0448 09/12/18 0442  WBC 12.3* 14.3* 13.1* 12.0* 14.1*  NEUTROABS  --   --   --  8.5*  --   HGB 16.0* 15.3* 14.6 14.9 14.3  HCT 48.1* 46.3* 45.1 46.4* 43.9  MCV 91.3 92.0 93.6 92.6 94.0  PLT 291 286 253 282 249   Basic Metabolic Panel: Recent Labs  Lab 09/09/18 1702 09/10/18 0451 09/11/18 0448 09/12/18 0442  NA 139 138 141 141  K 3.2* 3.4* 3.7 3.1*  CL 101 102 105 107  CO2 25 23 25 23   GLUCOSE 138* 138* 131* 123*  BUN 17 15 14 15   CREATININE 0.80 0.70 0.87 0.66  CALCIUM 9.5 8.8* 8.6* 8.7*  MG  --  2.3 2.3 2.4   GFR: Estimated Creatinine Clearance: 165.4 mL/min (by C-G formula based on SCr of 0.66 mg/dL). Liver Function Tests: Recent Labs  Lab 09/09/18 1702  AST 34  ALT 33  ALKPHOS 42  BILITOT 0.9  PROT 7.7  ALBUMIN 4.4   Recent Labs  Lab 09/09/18 1702  LIPASE 33   No results for input(s): AMMONIA in the last 168 hours. Coagulation Profile: No results for input(s): INR, PROTIME in the last 168 hours. Cardiac Enzymes: No results for input(s): CKTOTAL, CKMB, CKMBINDEX, TROPONINI in the last 168 hours. BNP (last 3 results) No results for input(s): PROBNP in the last 8760 hours. HbA1C: No  results for input(s): HGBA1C in the last 72 hours. CBG: Recent Labs  Lab 09/12/18 1625 09/12/18 1936 09/12/18 2341 09/13/18 0351 09/13/18 0728  GLUCAP 129* 108* 103* 121* 102*   Lipid Profile: No results for input(s): CHOL, HDL, LDLCALC, TRIG, CHOLHDL, LDLDIRECT in the last 72 hours. Thyroid Function Tests: No results for input(s): TSH, T4TOTAL, FREET4, T3FREE, THYROIDAB in the last 72 hours. Anemia Panel: No results for input(s): VITAMINB12, FOLATE, FERRITIN, TIBC, IRON, RETICCTPCT in the last 72 hours. Sepsis Labs: No results for input(s): PROCALCITON, LATICACIDVEN in the last 168 hours.  Recent Results (from the past 240 hour(s))  Urine culture     Status: Abnormal   Collection Time: 09/09/18  9:14 PM  Result Value Ref Range Status   Specimen Description URINE, CLEAN CATCH  Final   Special Requests   Final    NONE Performed at  [facility], [address] [place], [place] [address]    Culture (A)  Final    >=100,000 COLONIES/mL MULTIPLE SPECIES PRESENT, SUGGEST RECOLLECTION   Report Status 09/12/2018 FINAL  Final         Radiology Studies: Dg Abd Portable 1v-small Bowel Obstruction Protocol-initial, 8 Hr Delay  Result Date: 09/11/2018 CLINICAL DATA:  8 hour delayed imaging after small bowel protocol. Contrast given at 11:30 a.m. EXAM: PORTABLE ABDOMEN - 1 VIEW COMPARISON:  05/23/2019 FINDINGS: Dilated small bowel loops are noted in the left hemiabdomen without contrast. Enteric contrast is poorly visualized but is noted within proximal small bowel loops. The gas-filled loops of dilated small bowel in the left hemiabdomen measure up to 3.8 cm caliber. IMPRESSION: Dilated loops of small bowel in the left hemiabdomen without contrast. Findings may reflect small bowel obstruction or ileus. Electronically Signed   By: Tollie Ethavid  Kwon M.D.   On: 09/11/2018 17:47        Scheduled Meds: . enoxaparin (LOVENOX) injection  80 mg Subcutaneous Q24H  . insulin  aspart  0-9 Units Subcutaneous Q4H  . metoprolol tartrate  5 mg Intravenous Q8H   Continuous Infusions: . 0.9 % NaCl with KCl 40 mEq / L 100 mL/hr (09/13/18 0804)  . famotidine (PEPCID) IV Stopped (09/12/18 2204)  . ondansetron [facility](ZOFRAN) IV 8 mg (09/13/18 0558)     LOS: 4 days    Time spent: 35 minutes    Ramiro Harvestaniel Thompson, MD [facility]  If 7PM-7AM, please contact night-coverage [email or web address] 09/13/2018, 8:59 AM

## 2018-09-13 NOTE — Progress Notes (Addendum)
Kelly Vargas [id number] 08/07/1971  CARE TEAM:  PCP: Boneta LucksBrown, Jennifer, NP  Outpatient Care Team: Patient Care Team: Boneta LucksBrown, Jennifer, NP as PCP - General (Nurse Practitioner)  Inpatient Treatment Team: Treatment Team: Attending Provider: Rodolph Bonghompson, Daniel V, MD; Rounding Team: Montez Moritacs, Md, MD; Rounding Team: Delmer Islamriadhosp, Wl5, MD; Technician: Kela MillinSeminario, Franco A, [place]; Registered Nurse: Sabino SnipesSmith, Eugenia E, RN; Registered Nurse: Pincus BadderWhittemore, Sarah J, RN; Registered Nurse: Hilliard ClarkPerez, Adriana, RN; Registered Nurse: Quentin CornwallMichaux, Deanna, RN; Registered Nurse: Modesta MessingSutton, Amanda V, RN; Technician: Beatris ShipNelson, Courtney L, NT   Problem List:   Principal Problem:   SBO (small bowel obstruction) [facility](HCC) Active Problems:   High blood pressure associated with diabetes (HCC)   Periumbilical hernia   Ventral hernia   Generalized abdominal pain   Type 2 diabetes mellitus without complication, without long-term current use of insulin (HCC)   Depression   Anxiety      * No surgery found *      Assessment  Morbidly obese, BMI 58 OSA PCOS HTN DM  SBO with transition point in recurrent incarcerated incisional hernia    [facility](Hospital Stay = 4 days)  Plan:  I am concerned that the patient is worse today.  She has worsening pain.  She is not been able to tolerate nasogastric tube clamping and even air flushing bothers her.  She has been taking a lot of sips and ice chips and yet her NG tube return seems thicker to me.  While she did have a bowel movement, I do not trust that that is significant since she cannot evacuate her colon distal to the obstruction.  She is not having flatus as she did yesterday.  Flushing the NG tube with air yesterday morning by me did not seem to bother her.  She notes that flushing air in the NGT by the nurses has been bothering her since last night  I think she is failed conservative management and I recommended surgery.  Because she is feeling worsening pain and seems worse to me, I  recommended today.  She wished to wait.  She wished to hold off.  When I again raised my concerns of risk of bowel ischemia with her worsening pain, she wished to get a different surgeon opinion.   She made a comment saying "you probably think that I am just a stupid nurse".  I tried to dissuade her of that.  I just noted that we were asked our surgical opinion and I have significant experience with bowel obstruction as our entire group does and I think she is gone the wrong way and waiting is not safe.  I thought we hit it off fine yesterday, but she is more guarded and miserable today.  She stated she would like a different opinion.  I will see if my partner, Dr. Ezzard StandingNewman, who is on-call with me this weekend can discuss with her.  She is familiar with him and stated she would trust his opinion.  FEN -IV fluids for now   VTE - Lovenox   ID -no infection - not currently needed  Sleep apnea.  Challenged to use CPAP machine with nasogastric tube in place.  I asked if patient can bring her mask and machine to work with respiratory therapy to find a way.  Hopefully we can get the NG tube out soon and this will be less of an issue.  Mobilize as tolerated to help recovery.  She wishes to get up more is actually quite mobile and active..  Try  and get bariatric commode and chair for her superobesity.  30 minutes spent in review, evaluation, examination, counseling, and coordination of care.  More than 50% of that time was spent in counseling.  09/13/2018    Subjective: (Chief complaint)  Had a rough night.  Did have a small bowel movement.  No more flatus though.  Has not been able to tolerate the nasogastric tube being clamped for more than a few hours.  Flushing air bothers her.  Feeling more sore at her hernia site this morning.  Francoise Ceo RN in room  Does not want surgery  Objective:  Vital signs:  Vitals:   09/12/18 1410 09/12/18 1450 09/12/18 2144 09/13/18 0515  BP: (!)  139/59 133/79 (!) 157/93 (!) 153/75  Pulse: 76 78 70 82  Resp: 16 18 16 16   Temp: 98.3 F (36.8 C) 99.6 F (37.6 C) 98.7 F (37.1 C) 98.9 F (37.2 C)  TempSrc:  Oral Oral Oral  SpO2: 100% 94% 94% 92%  Weight:      Height:        Last BM Date: 09/07/18  Intake/Output   Yesterday:  01/11 0701 - 01/12 0700 In: 5110.4 [P.O.:2700; I.V.:1951.8; IV Piggyback:458.7] Out: 4250 [Emesis/NG output:4250] This shift:  No intake/output data recorded.  Bowel function:  Flatus: No  BM:  YES- small  Drain: Naso gastric tube with thicker brown fluid.   Physical Exam:  General: Pt awake/alert/oriented x4 in mild acute distress.  Up in chair.  Looks tired/exhausted.  Uncomfortable.   Eyes: PERRL, normal EOM.  Sclera clear.  No icterus Neuro: CN II-XII intact w/o focal sensory/motor deficits. Lymph: No head/neck/groin lymphadenopathy Psych:  No delerium/psychosis/paranoia HENT: Normocephalic, Mucus membranes moist.  No thrush.  Narrow NG tube in place with bilious output Neck: Supple, No tracheal deviation Chest: No chest wall pain w good excursion CV:  Pulses intact.  Regular rhythm MS: Normal AROM mjr joints.  No obvious deformity  Abdomen: Soft.  Nondistended.  9 x 9 supraumbilical right-sided mass consistent with recurrent incarcerated umbilical ventral hernia.  +TTP.  No cellulitis or erythema .  No evidence of peritonitis.  No incarcerated hernias.  Ext:  No deformity.  No mjr edema.  No cyanosis Skin: No petechiae / purpura  Results:   Labs: Results for orders placed or performed during the hospital encounter of 09/09/18 (from the past 48 hour(s))  Glucose, capillary     Status: Abnormal   Collection Time: 09/11/18 11:51 AM  Result Value Ref Range   Glucose-Capillary 138 (H) 70 - 99 mg/dL  Glucose, capillary     Status: Abnormal   Collection Time: 09/11/18  4:49 PM  Result Value Ref Range   Glucose-Capillary 128 (H) 70 - 99 mg/dL  Glucose, capillary     Status: Abnormal     Collection Time: 09/11/18  7:28 PM  Result Value Ref Range   Glucose-Capillary 123 (H) 70 - 99 mg/dL  Glucose, capillary     Status: Abnormal   Collection Time: 09/12/18 12:04 AM  Result Value Ref Range   Glucose-Capillary 117 (H) 70 - 99 mg/dL  Glucose, capillary     Status: Abnormal   Collection Time: 09/12/18  4:10 AM  Result Value Ref Range   Glucose-Capillary 111 (H) 70 - 99 mg/dL  CBC     Status: Abnormal   Collection Time: 09/12/18  4:42 AM  Result Value Ref Range   WBC 14.1 (H) 4.0 - 10.5 K/uL   RBC 4.67 3.87 -  5.11 MIL/uL   Hemoglobin 14.3 12.0 - 15.0 g/dL   HCT 96.043.9 45.436.0 - 09.846.0 %   MCV 94.0 80.0 - 100.0 fL   MCH 30.6 26.0 - 34.0 pg   MCHC 32.6 30.0 - 36.0 g/dL   RDW 11.913.3 14.711.5 - 82.915.5 %   Platelets 249 150 - 400 K/uL   nRBC 0.0 0.0 - 0.2 %    Comment: Performed at [facility], [address] [place], [place] [address]  Basic metabolic panel     Status: Abnormal   Collection Time: 09/12/18  4:42 AM  Result Value Ref Range   Sodium 141 135 - 145 mmol/L   Potassium 3.1 (L) 3.5 - 5.1 mmol/L   Chloride 107 98 - 111 mmol/L   CO2 23 22 - 32 mmol/L   Glucose, Bld 123 (H) 70 - 99 mg/dL   BUN 15 6 - 20 mg/dL   Creatinine, Ser 0.860.66 0.44 - 1.00 mg/dL   Calcium 8.7 (L) 8.9 - 10.3 mg/dL   GFR calc non Af Amer >60 >60 mL/min   GFR calc Af Amer >60 >60 mL/min   Anion gap 11 5 - 15    Comment: Performed at [facility], [address] [place], [place] [address]  Magnesium     Status: None   Collection Time: 09/12/18  4:42 AM  Result Value Ref Range   Magnesium 2.4 1.7 - 2.4 mg/dL    Comment: Performed at [facility], [address] [place], [place] [address]  Glucose, capillary     Status: Abnormal   Collection Time: 09/12/18  7:40 AM  Result Value Ref Range   Glucose-Capillary 122 (H) 70 - 99 mg/dL  Glucose, capillary     Status: Abnormal   Collection Time: 09/12/18 12:25 PM  Result Value Ref Range    Glucose-Capillary 131 (H) 70 - 99 mg/dL  Glucose, capillary     Status: Abnormal   Collection Time: 09/12/18  4:25 PM  Result Value Ref Range   Glucose-Capillary 129 (H) 70 - 99 mg/dL  Glucose, capillary     Status: Abnormal   Collection Time: 09/12/18  7:36 PM  Result Value Ref Range   Glucose-Capillary 108 (H) 70 - 99 mg/dL  Glucose, capillary     Status: Abnormal   Collection Time: 09/12/18 11:41 PM  Result Value Ref Range   Glucose-Capillary 103 (H) 70 - 99 mg/dL  Glucose, capillary     Status: Abnormal   Collection Time: 09/13/18  3:51 AM  Result Value Ref Range   Glucose-Capillary 121 (H) 70 - 99 mg/dL  Glucose, capillary     Status: Abnormal   Collection Time: 09/13/18  7:28 AM  Result Value Ref Range   Glucose-Capillary 102 (H) 70 - 99 mg/dL    Imaging / Studies: Dg Abd Portable 1v-small Bowel Obstruction Protocol-initial, 8 Hr Delay  Result Date: 09/11/2018 CLINICAL DATA:  8 hour delayed imaging after small bowel protocol. Contrast given at 11:30 a.m. EXAM: PORTABLE ABDOMEN - 1 VIEW COMPARISON:  05/23/2019 FINDINGS: Dilated small bowel loops are noted in the left hemiabdomen without contrast. Enteric contrast is poorly visualized but is noted within proximal small bowel loops. The gas-filled loops of dilated small bowel in the left hemiabdomen measure up to 3.8 cm caliber. IMPRESSION: Dilated loops of small bowel in the left hemiabdomen without contrast. Findings may reflect small bowel obstruction or ileus. Electronically Signed   By: Rene Kocheravid  Kwon M.D.  On: 09/11/2018 17:47    Medications / Allergies: per chart  Antibiotics: Anti-infectives (From admission, onward)   None        Note: Portions of this report may have been transcribed using voice recognition software. Every effort was made to ensure accuracy; however, inadvertent computerized transcription errors may be present.   Any transcriptional errors that result from this process are  unintentional.     Ardeth Sportsman, MD, FACS, MASCRS Gastrointestinal and Minimally Invasive Surgery    [address] [place], [place] [address] [phone] Main / Paging [phone] Fax

## 2018-09-14 LAB — GLUCOSE, CAPILLARY
GLUCOSE-CAPILLARY: 105 mg/dL — AB (ref 70–99)
Glucose-Capillary: 108 mg/dL — ABNORMAL HIGH (ref 70–99)
Glucose-Capillary: 112 mg/dL — ABNORMAL HIGH (ref 70–99)
Glucose-Capillary: 118 mg/dL — ABNORMAL HIGH (ref 70–99)
Glucose-Capillary: 91 mg/dL (ref 70–99)
Glucose-Capillary: 99 mg/dL (ref 70–99)

## 2018-09-14 LAB — BASIC METABOLIC PANEL
Anion gap: 10 (ref 5–15)
BUN: 12 mg/dL (ref 6–20)
CO2: 22 mmol/L (ref 22–32)
Calcium: 8.4 mg/dL — ABNORMAL LOW (ref 8.9–10.3)
Chloride: 106 mmol/L (ref 98–111)
Creatinine, Ser: 0.74 mg/dL (ref 0.44–1.00)
GFR calc non Af Amer: >60 mL/min (ref >60)
Glucose, Bld: 110 mg/dL — ABNORMAL HIGH (ref 70–99)
Potassium: 3.6 mmol/L (ref 3.5–5.1)
Sodium: 138 mmol/L (ref 135–145)

## 2018-09-14 LAB — CBC WITH DIFFERENTIAL/PLATELET
Abs Immature Granulocytes: 0.06 10*3/uL (ref 0.00–0.07)
Basophils Absolute: 0.1 10*3/uL (ref 0.0–0.1)
Basophils Relative: 1 %
Eosinophils Absolute: 0.4 10*3/uL (ref 0.0–0.5)
Eosinophils Relative: 3 %
HCT: 45.7 % (ref 36.0–46.0)
Hemoglobin: 14.5 g/dL (ref 12.0–15.0)
Immature Granulocytes: 0 %
Lymphocytes Relative: 16 %
Lymphs Abs: 2.1 10*3/uL (ref 0.7–4.0)
MCH: 29.5 pg (ref 26.0–34.0)
MCHC: 31.7 g/dL (ref 30.0–36.0)
MCV: 92.9 fL (ref 80.0–100.0)
MONO ABS: 1.2 10*3/uL — AB (ref 0.1–1.0)
Monocytes Relative: 9 %
Neutro Abs: 9.5 10*3/uL — ABNORMAL HIGH (ref 1.7–7.7)
Neutrophils Relative %: 71 %
Platelets: 261 10*3/uL (ref 150–400)
RBC: 4.92 MIL/uL (ref 3.87–5.11)
RDW: 13.1 % (ref 11.5–15.5)
WBC: 13.4 10*3/uL — ABNORMAL HIGH (ref 4.0–10.5)
nRBC: 0 % (ref 0.0–0.2)

## 2018-09-14 NOTE — Telephone Encounter (Signed)
Left message on vm for pt to return call to office 

## 2018-09-14 NOTE — Progress Notes (Signed)
Pt has refused BIPAP QHS, RT to monitor and assess as needed.

## 2018-09-14 NOTE — Progress Notes (Signed)
Patient ID: Kelly Vargas, female   DOB: 07/11/1971, 48 y.o.   MRN: [id number] [facility] Progress Note:   * No surgery found *  Subjective: Mental status is alert;  NG clamped for 24 hrs;  Passing flatus and stool Objective: Vital signs in last 24 hours: Temp:  [97.5 F (36.4 C)-98.3 F (36.8 C)] 98.3 F (36.8 C) (01/13 0542) Pulse Rate:  [77-82] 82 (01/13 0542) Resp:  [18-20] 18 (01/13 0542) BP: (129-160)/(84-111) 145/86 (01/13 0542) SpO2:  [95 %-97 %] 95 % (01/13 0542)  Intake/Output from previous day: 01/12 0701 - 01/13 0700 In: 1948.7 [P.O.:570; I.V.:1224.7; IV Piggyback:154] Out: 900 [Emesis/NG output:900] Intake/Output this shift: No intake/output data recorded.  Physical Exam: Work of breathing is not labored.  No abdominal pain  Lab Results:  Results for orders placed or performed during the hospital encounter of 09/09/18 (from the past 48 hour(s))  Glucose, capillary     Status: Abnormal   Collection Time: 09/12/18 12:25 PM  Result Value Ref Range   Glucose-Capillary 131 (H) 70 - 99 mg/dL  Glucose, capillary     Status: Abnormal   Collection Time: 09/12/18  4:25 PM  Result Value Ref Range   Glucose-Capillary 129 (H) 70 - 99 mg/dL  Glucose, capillary     Status: Abnormal   Collection Time: 09/12/18  7:36 PM  Result Value Ref Range   Glucose-Capillary 108 (H) 70 - 99 mg/dL  Glucose, capillary     Status: Abnormal   Collection Time: 09/12/18 11:41 PM  Result Value Ref Range   Glucose-Capillary 103 (H) 70 - 99 mg/dL  Glucose, capillary     Status: Abnormal   Collection Time: 09/13/18  3:51 AM  Result Value Ref Range   Glucose-Capillary 121 (H) 70 - 99 mg/dL  Glucose, capillary     Status: Abnormal   Collection Time: 09/13/18  7:28 AM  Result Value Ref Range   Glucose-Capillary 102 (H) 70 - 99 mg/dL  CBC with Differential/Platelet     Status: Abnormal   Collection Time: 09/13/18  9:22 AM  Result Value Ref Range   WBC 14.6 (H) 4.0 - 10.5 K/uL   RBC 5.04 3.87 - 5.11 MIL/uL   Hemoglobin 14.8 12.0 - 15.0 g/dL   HCT 40.945.8 81.136.0 - 91.446.0 %   MCV 90.9 80.0 - 100.0 fL   MCH 29.4 26.0 - 34.0 pg   MCHC 32.3 30.0 - 36.0 g/dL   RDW 78.213.1 95.611.5 - 21.315.5 %   Platelets 281 150 - 400 K/uL   nRBC 0.0 0.0 - 0.2 %   Neutrophils Relative % 74 %   Neutro Abs 10.8 (H) 1.7 - 7.7 K/uL   Lymphocytes Relative 15 %   Lymphs Abs 2.2 0.7 - 4.0 K/uL   Monocytes Relative 7 %   Monocytes Absolute 1.0 0.1 - 1.0 K/uL   Eosinophils Relative 2 %   Eosinophils Absolute 0.3 0.0 - 0.5 K/uL   Basophils Relative 1 %   Basophils Absolute 0.1 0.0 - 0.1 K/uL   Immature Granulocytes 1 %   Abs Immature Granulocytes 0.08 (H) 0.00 - 0.07 K/uL    Comment: Performed at [facility], [address] [place], [place] [address]  Magnesium     Status: None   Collection Time: 09/13/18  9:22 AM  Result Value Ref Range   Magnesium 2.2 1.7 - 2.4 mg/dL    Comment: Performed at [facility], [address] [place], [place] [address]  Comprehensive metabolic panel     Status: Abnormal   Collection Time: 09/13/18  9:22 AM  Result Value Ref Range   Sodium 139 135 - 145 mmol/L   Potassium 3.6 3.5 - 5.1 mmol/L   Chloride 107 98 - 111 mmol/L   CO2 23 22 - 32 mmol/L   Glucose, Bld 133 (H) 70 - 99 mg/dL   BUN 15 6 - 20 mg/dL   Creatinine, Ser 1.610.66 0.44 - 1.00 mg/dL   Calcium 8.8 (L) 8.9 - 10.3 mg/dL   Total Protein 7.1 6.5 - 8.1 g/dL   Albumin 3.9 3.5 - 5.0 g/dL   AST 43 (H) 15 - 41 U/L   ALT 44 0 - 44 U/L   Alkaline Phosphatase 42 38 - 126 U/L   Total Bilirubin 1.1 0.3 - 1.2 mg/dL   GFR calc non Af Amer >60 >60 mL/min   GFR calc Af Amer >60 >60 mL/min   Anion gap 9 5 - 15    Comment: Performed at [facility], [address] [place], [place] [address]  Protime-INR     Status: None   Collection Time: 09/13/18  9:22 AM  Result Value Ref Range   Prothrombin Time 14.1 11.4 - 15.2 seconds   INR 1.10     Comment: Performed  at [facility], [address] [place], [place] [address]  Glucose, capillary     Status: Abnormal   Collection Time: 09/13/18 11:42 AM  Result Value Ref Range   Glucose-Capillary 123 (H) 70 - 99 mg/dL  Glucose, capillary     Status: Abnormal   Collection Time: 09/13/18  4:02 PM  Result Value Ref Range   Glucose-Capillary 133 (H) 70 - 99 mg/dL  Glucose, capillary     Status: Abnormal   Collection Time: 09/13/18  8:06 PM  Result Value Ref Range   Glucose-Capillary 136 (H) 70 - 99 mg/dL  Glucose, capillary     Status: None   Collection Time: 09/14/18 12:16 AM  Result Value Ref Range   Glucose-Capillary 91 70 - 99 mg/dL  Glucose, capillary     Status: Abnormal   Collection Time: 09/14/18  4:18 AM  Result Value Ref Range   Glucose-Capillary 108 (H) 70 - 99 mg/dL  CBC with Differential/Platelet     Status: Abnormal   Collection Time: 09/14/18  4:45 AM  Result Value Ref Range   WBC 13.4 (H) 4.0 - 10.5 K/uL   RBC 4.92 3.87 - 5.11 MIL/uL   Hemoglobin 14.5 12.0 - 15.0 g/dL   HCT 11.945.7 14.736.0 - 82.946.0 %   MCV 92.9 80.0 - 100.0 fL   MCH 29.5 26.0 - 34.0 pg   MCHC 31.7 30.0 - 36.0 g/dL   RDW 56.213.1 13.011.5 - 86.515.5 %   Platelets 261 150 - 400 K/uL   nRBC 0.0 0.0 - 0.2 %   Neutrophils Relative % 71 %   Neutro Abs 9.5 (H) 1.7 - 7.7 K/uL   Lymphocytes Relative 16 %   Lymphs Abs 2.1 0.7 - 4.0 K/uL   Monocytes Relative 9 %   Monocytes Absolute 1.2 (H) 0.1 - 1.0 K/uL   Eosinophils Relative 3 %   Eosinophils Absolute 0.4 0.0 - 0.5 K/uL   Basophils Relative 1 %   Basophils Absolute 0.1 0.0 - 0.1 K/uL   Immature Granulocytes 0 %   Abs Immature Granulocytes 0.06 0.00 - 0.07 K/uL    Comment: Performed at [facility], [address] [place], [place] [address]  Basic metabolic panel     Status: Abnormal   Collection Time: 09/14/18  4:45 AM  Result Value Ref Range   Sodium 138 135 - 145 mmol/L   Potassium 3.6 3.5 - 5.1 mmol/L   Chloride 106 98 - 111 mmol/L    CO2 22 22 - 32 mmol/L   Glucose, Bld 110 (H) 70 - 99 mg/dL   BUN 12 6 - 20 mg/dL   Creatinine, Ser 5.39 0.44 - 1.00 mg/dL   Calcium 8.4 (L) 8.9 - 10.3 mg/dL   GFR calc non Af Amer >60 >60 mL/min   GFR calc Af Amer >60 >60 mL/min   Anion gap 10 5 - 15    Comment: Performed at [facility], [address] [place], [place] [address]  Glucose, capillary     Status: None   Collection Time: 09/14/18  7:38 AM  Result Value Ref Range   Glucose-Capillary 99 70 - 99 mg/dL    Radiology/Results: Dg Abd 2 Views  Result Date: 09/13/2018 CLINICAL DATA:  Followup small bowel obstruction. EXAM: ABDOMEN - 2 VIEW COMPARISON:  09/11/2018 FINDINGS: Nasogastric tube is seen with tip in proximal stomach. Moderately dilated small bowel loops containing air-fluid levels are seen within the left lower quadrant, with mildly increased number and dilatation since prior study. A small amount of residual contrast is seen in the colon which is nondilated. These findings remain consistent with a distal small bowel obstruction. No evidence of free intraperitoneal air. IMPRESSION: Distal small bowel obstruction, with mild increase in small bowel dilatation since prior study. Nasogastric tube tip in proximal stomach. Electronically Signed   By: Myles Rosenthal M.D.   On: 09/13/2018 16:24    Anti-infectives: Anti-infectives (From admission, onward)   None      Assessment/Plan: Problem List: Patient Active Problem List   Diagnosis Date Noted  . Generalized abdominal pain   . Type 2 diabetes mellitus without complication, without long-term current use of insulin (HCC)   . Depression   . Anxiety   . SBO (small bowel obstruction) (HCC) 09/09/2018  . Recurrent incisional hernia with incarceration 09/09/2018  . PCOS (polycystic ovarian syndrome) 03/02/2015  . Morbid obesity with BMI of 50.0-59.9, adult (HCC) 03/02/2015  . Hirsutism 03/02/2015  . High blood pressure associated with diabetes (HCC)  03/02/2015    Discussed with patient.  She wants her NG out and advance diet.  Both ordered. Will follow * No surgery found *    LOS: 5 days   Matt B. Daphine Deutscher, MD, [facility] [phone] beeper [phone]  09/14/2018 9:41 AM

## 2018-09-14 NOTE — Progress Notes (Signed)
PROGRESS NOTE    Kelly Vargas  BJY:[id number] DOB: 09/27/1970 DOA: 09/09/2018 PCP: Boneta LucksBrown, Jennifer, NP    Brief Narrative:   Kelly Vargas is a 48 y.o. female with medical history significant of PCOS, DM, HTN.  Patient presents to the ED for evaluation of abd pain, N/V.  Symptoms onset 4 days ago, worsening.  Unable to tolerate anything PO.  Does report episodes of diarrhea.   ED Course: CT reveals PSBO likely due to 2 separate hernias contributing (periumbilical and ventral hernias).   Assessment & Plan:   Principal Problem:   Recurrent incisional hernia with incarceration Active Problems:   Morbid obesity with BMI of 50.0-59.9, adult (HCC)   High blood pressure associated with diabetes (HCC)   SBO (small bowel obstruction) (HCC)   Generalized abdominal pain   Type 2 diabetes mellitus without complication, without long-term current use of insulin (HCC)   Depression   Anxiety  1 small bowel obstruction versus partial small bowel obstruction Secondary to 2 hernias.  NG tube has been removed this morning.  Patient has been seen in consultation by general surgery who are recommending conservative management at this time as patient will be difficult operative repair with high likelihood of eventual failure.  IV fluids, supportive care, mobilize.  Patient states passing flatus and had bowel movement last night which was watery.  NG tube has been removed per general surgery and patient started on clears. Keep potassium greater than 4.  Keep magnesium greater than 2.  Small bowel protocol ordered per general surgery.  General surgery following and appreciate input and recommendations.    2.  Obstructive sleep apnea BiPAP nightly on hold due to NG tube.  3.  Diabetes mellitus type 2 Oral hypoglycemic agents on hold.  CBG 99 this morning.  Continue sliding scale insulin.  4.  Hypertension Continue to hold HCTZ and spironolactone.  Continue IV Lopressor.  Follow.  5.   Depression/anxiety Patient with small bowel obstruction.  Has just been started on some clears per general surgery.  Continue to hold Cymbalta and Lexapro will resume on discharge home when tolerating a solid diet.  IV Ativan as needed.    6.  Hypokalemia Repleted.   DVT prophylaxis: Lovenox Code Status: Full Family Communication: Updated patient. No family at bedside. Disposition Plan: Home once clinically improved, resolution of small bowel obstruction and per general surgery.   Consultants:   General surgery: Dr. Sheliah HatchKinsinger 09/09/2018  Small bowel protocol 09/10/2018  Procedures:  CT abdomen and pelvis 09/09/2018  Antimicrobials:   None   Subjective: Patient in chair.  NG tube has been removed.  Patient states passing flatus.  Patient stated had some loose bowel movements last night.  Patient has been started on clears per general surgery.  Denies any chest pain.  Denies any shortness of breath.  Objective: Vitals:   09/13/18 0515 09/13/18 1327 09/13/18 2115 09/14/18 0542  BP: (!) 153/75 (!) 160/111 129/84 (!) 145/86  Pulse: 82 79 77 82  Resp: 16 18 20 18   Temp: 98.9 F (37.2 C) 98.2 F (36.8 C) (!) 97.5 F (36.4 C) 98.3 F (36.8 C)  TempSrc: Oral Oral Oral Oral  SpO2: 92% 95% 97% 95%  Weight:      Height:        Intake/Output Summary (Last 24 hours) at 09/14/2018 1150 Last data filed at 09/14/2018 0948 Gross per 24 hour  Intake 1063.06 ml  Output 0 ml  Net 1063.06 ml   [employer]ights  09/10/18 0700  Weight: (!) 188.9 kg    Examination:  General exam: NAD Respiratory system: CTAB.  No wheezes, no crackles, no rhonchi.  Normal respiratory effort.  Cardiovascular system: RRR no murmurs rubs or gallops.  No JVD.  No lower extremity edema.    Gastrointestinal system: Abdomen is soft, nondistended, decreased tenderness to palpation, hypoactive bowel sounds.  Palpable firm incarcerated lower midline abdominal wall hernia noted.  No rebound.  No guarding.  Central  nervous system: Alert and oriented. No focal neurological deficits. Extremities: Symmetric 5 x 5 power. Skin: No rashes, lesions or ulcers Psychiatry: Judgement and insight appear normal. Mood & affect appropriate.     Data Reviewed: I have personally reviewed following labs and imaging studies  CBC: Recent Labs  Lab 09/10/18 2204 09/11/18 0448 09/12/18 0442 09/13/18 0922 09/14/18 0445  WBC 13.1* 12.0* 14.1* 14.6* 13.4*  NEUTROABS  --  8.5*  --  10.8* 9.5*  HGB 14.6 14.9 14.3 14.8 14.5  HCT 45.1 46.4* 43.9 45.8 45.7  MCV 93.6 92.6 94.0 90.9 92.9  PLT 253 282 249 281 261   Basic Metabolic Panel: Recent Labs  Lab 09/10/18 0451 09/11/18 0448 09/12/18 0442 09/13/18 0922 09/14/18 0445  NA 138 141 141 139 138  K 3.4* 3.7 3.1* 3.6 3.6  CL 102 105 107 107 106  CO2 23 25 23 23 22   GLUCOSE 138* 131* 123* 133* 110*  BUN 15 14 15 15 12   CREATININE 0.70 0.87 0.66 0.66 0.74  CALCIUM 8.8* 8.6* 8.7* 8.8* 8.4*  MG 2.3 2.3 2.4 2.2  --    GFR: Estimated Creatinine Clearance: 165.4 mL/min (by C-G formula based on SCr of 0.74 mg/dL). Liver Function Tests: Recent Labs  Lab 09/09/18 1702 09/13/18 0922  AST 34 43*  ALT 33 44  ALKPHOS 42 42  BILITOT 0.9 1.1  PROT 7.7 7.1  ALBUMIN 4.4 3.9   Recent Labs  Lab 09/09/18 1702  LIPASE 33   No results for input(s): AMMONIA in the last 168 hours. Coagulation Profile: Recent Labs  Lab 09/13/18 0922  INR 1.10   Cardiac Enzymes: No results for input(s): CKTOTAL, CKMB, CKMBINDEX, TROPONINI in the last 168 hours. BNP (last 3 results) No results for input(s): PROBNP in the last 8760 hours. HbA1C: No results for input(s): HGBA1C in the last 72 hours. CBG: Recent Labs  Lab 09/13/18 2006 09/14/18 0016 09/14/18 0418 09/14/18 0738 09/14/18 1142  GLUCAP 136* 91 108* 99 118*   Lipid Profile: No results for input(s): CHOL, HDL, LDLCALC, TRIG, CHOLHDL, LDLDIRECT in the last 72 hours. Thyroid Function Tests: No results for  input(s): TSH, T4TOTAL, FREET4, T3FREE, THYROIDAB in the last 72 hours. Anemia Panel: No results for input(s): VITAMINB12, FOLATE, FERRITIN, TIBC, IRON, RETICCTPCT in the last 72 hours. Sepsis Labs: No results for input(s): PROCALCITON, LATICACIDVEN in the last 168 hours.  Recent Results (from the past 240 hour(s))  Urine culture     Status: Abnormal   Collection Time: 09/09/18  9:14 PM  Result Value Ref Range Status   Specimen Description URINE, CLEAN CATCH  Final   Special Requests   Final    NONE Performed at [facility], [address], [place], [place] [address]    Culture (A)  Final    >=100,000 COLONIES/mL MULTIPLE SPECIES PRESENT, SUGGEST RECOLLECTION   Report Status 09/12/2018 FINAL  Final         Radiology Studies: Dg Abd 2 Views  Result Date: 09/13/2018 CLINICAL DATA:  Followup small bowel obstruction. EXAM: ABDOMEN - 2 VIEW COMPARISON:  09/11/2018 FINDINGS: Nasogastric tube is seen with tip in proximal stomach. Moderately dilated small bowel loops containing air-fluid levels are seen within the left lower quadrant, with mildly increased number and dilatation since prior study. A small amount of residual contrast is seen in the colon which is nondilated. These findings remain consistent with a distal small bowel obstruction. No evidence of free intraperitoneal air. IMPRESSION: Distal small bowel obstruction, with mild increase in small bowel dilatation since prior study. Nasogastric tube tip in proximal stomach. Electronically Signed   By: Myles Rosenthal M.D.   On: 09/13/2018 16:24        Scheduled Meds: . enoxaparin (LOVENOX) injection  80 mg Subcutaneous Q24H  . insulin aspart  0-9 Units Subcutaneous Q4H  . metoprolol tartrate  5 mg Intravenous Q8H   Continuous Infusions: . 0.9 % NaCl with KCl 40 mEq / L 100 mL/hr (09/14/18 1057)  . famotidine (PEPCID) IV 20 mg (09/14/18 1057)  . ondansetron [facility]) IV 8 mg (09/14/18 0859)     LOS: 5 days     Time spent: 35 minutes    Ramiro Harvest, MD [facility]  If 7PM-7AM, please contact night-coverage [email or web address] 09/14/2018, 11:50 AM

## 2018-09-15 LAB — GLUCOSE, CAPILLARY
Glucose-Capillary: 102 mg/dL — ABNORMAL HIGH (ref 70–99)
Glucose-Capillary: 110 mg/dL — ABNORMAL HIGH (ref 70–99)
Glucose-Capillary: 112 mg/dL — ABNORMAL HIGH (ref 70–99)
Glucose-Capillary: 91 mg/dL (ref 70–99)
Glucose-Capillary: 97 mg/dL (ref 70–99)
Glucose-Capillary: 99 mg/dL (ref 70–99)

## 2018-09-15 LAB — BASIC METABOLIC PANEL
Anion gap: 11 (ref 5–15)
BUN: 7 mg/dL (ref 6–20)
CO2: 21 mmol/L — ABNORMAL LOW (ref 22–32)
Calcium: 8.4 mg/dL — ABNORMAL LOW (ref 8.9–10.3)
Chloride: 106 mmol/L (ref 98–111)
Creatinine, Ser: 0.77 mg/dL (ref 0.44–1.00)
GFR calc Af Amer: >60 mL/min (ref >60)
GFR calc non Af Amer: >60 mL/min (ref >60)
Glucose, Bld: 107 mg/dL — ABNORMAL HIGH (ref 70–99)
Potassium: 3.7 mmol/L (ref 3.5–5.1)
Sodium: 138 mmol/L (ref 135–145)

## 2018-09-15 LAB — CBC WITH DIFFERENTIAL/PLATELET
Abs Immature Granulocytes: 0.07 10*3/uL (ref 0.00–0.07)
Basophils Absolute: 0.1 10*3/uL (ref 0.0–0.1)
Basophils Relative: 1 %
EOS ABS: 0.4 10*3/uL (ref 0.0–0.5)
Eosinophils Relative: 3 %
HCT: 44.7 % (ref 36.0–46.0)
Hemoglobin: 14.1 g/dL (ref 12.0–15.0)
IMMATURE GRANULOCYTES: 1 %
Lymphocytes Relative: 19 %
Lymphs Abs: 2.1 10*3/uL (ref 0.7–4.0)
MCH: 30.2 pg (ref 26.0–34.0)
MCHC: 31.5 g/dL (ref 30.0–36.0)
MCV: 95.7 fL (ref 80.0–100.0)
Monocytes Absolute: 1 10*3/uL (ref 0.1–1.0)
Monocytes Relative: 9 %
Neutro Abs: 7.8 10*3/uL — ABNORMAL HIGH (ref 1.7–7.7)
Neutrophils Relative %: 67 %
Platelets: 219 10*3/uL (ref 150–400)
RBC: 4.67 MIL/uL (ref 3.87–5.11)
RDW: 13 % (ref 11.5–15.5)
WBC: 11.4 10*3/uL — ABNORMAL HIGH (ref 4.0–10.5)
nRBC: 0 % (ref 0.0–0.2)

## 2018-09-15 MED ORDER — FAMOTIDINE 20 MG PO TABS
20.0000 mg | ORAL_TABLET | Freq: Two times a day (BID) | ORAL | Status: DC
  Administered 2018-09-15 – 2018-09-16 (×3): 20 mg via ORAL
  Filled 2018-09-15 (×3): qty 1

## 2018-09-15 MED ORDER — INSULIN ASPART 100 UNIT/ML ~~LOC~~ SOLN: 0.0000 [IU] | Freq: Three times a day (TID) | SUBCUTANEOUS | Status: DC

## 2018-09-15 NOTE — Progress Notes (Signed)
PROGRESS NOTE    Kelly Vargas  JYN:[id number] DOB: 03/14/1971 DOA: 09/09/2018 PCP: Boneta LucksBrown, Jennifer, NP    Brief Narrative:   Kelly Vargas is a 48 y.o. female with medical history significant of PCOS, DM, HTN.  Patient presents to the ED for evaluation of abd pain, N/V.  Symptoms onset 4 days ago, worsening.  Unable to tolerate anything PO.  Does report episodes of diarrhea.   ED Course: CT reveals PSBO likely due to 2 separate hernias contributing (periumbilical and ventral hernias).   Assessment & Plan:   Principal Problem:   Recurrent incisional hernia with incarceration Active Problems:   Morbid obesity with BMI of 50.0-59.9, adult (HCC)   High blood pressure associated with diabetes (HCC)   SBO (small bowel obstruction) (HCC)   Generalized abdominal pain   Type 2 diabetes mellitus without complication, without long-term current use of insulin (HCC)   Depression   Anxiety  1 small bowel obstruction versus partial small bowel obstruction Secondary to 2 hernias.  NG tube has been removed this morning.  Patient has been seen in consultation by general surgery who are recommending conservative management at this time as patient will be difficult operative repair with high likelihood of eventual failure.  IV fluids, supportive care, mobilize.  Patient states passing flatus and having bowel movements.  NG tube has been removed.  Patient tolerating current diet.  Diet to be advanced per general surgery. Keep potassium greater than 4.  Keep magnesium greater than 2. General surgery following and appreciate input and recommendations.    2.  Obstructive sleep apnea BiPAP nightly on hold due to NG tube.  NG tube has been removed.  3.  Diabetes mellitus type 2 Oral hypoglycemic agents on hold.  CBG 99 this morning.  Continue sliding scale insulin.  4.  Hypertension Continue to hold HCTZ and spironolactone.  Continue IV Lopressor.  Once tolerating oral intake could likely resume home  oral antihypertensive medications.  Follow.  5.  Depression/anxiety Patient with small bowel obstruction.  Has just been started on some clears per general surgery.  Continue to hold Cymbalta and Lexapro till patient tolerating a solid diet and resume.  Likely resume on discharge. IV Ativan as needed.    6.  Hypokalemia Repleted.   DVT prophylaxis: Lovenox Code Status: Full Family Communication: Updated patient. No family at bedside. Disposition Plan: Home once clinically improved, resolution of small bowel obstruction and per general surgery hopefully in the next 24-hour.   Consultants:   General surgery: Dr. Sheliah HatchKinsinger 09/09/2018  Procedures:  CT abdomen and pelvis 09/09/2018  Small bowel protocol 09/10/2018  Antimicrobials:   None   Subjective: Patient in chair.  Patient states he is feeling much better.  No chest pain.  No shortness of breath.  Tolerating current diet.  Having bowel movements.  Passing flatus.  Patient still with some complaints of some colicky abdominal pain.  Objective: Vitals:   09/14/18 1329 09/14/18 1847 09/14/18 2127 09/15/18 0627  BP: (!) 164/106 (!) 152/99 (!) 145/92 136/84  Pulse: 79 74 79 79  Resp: 18  16 16   Temp: (!) 97.4 F (36.3 C)  98.4 F (36.9 C) (!) 97.4 F (36.3 C)  TempSrc: Oral  Oral Oral  SpO2: 99%  100% 98%  Weight:      Height:        Intake/Output Summary (Last 24 hours) at 09/15/2018 1152 Last data filed at 09/15/2018 1148 Gross per 24 hour  Intake 3262.36 ml  Output 450 ml  Net 2812.36 ml   Filed Weights   09/10/18 0700  Weight: (!) 188.9 kg    Examination:  General exam: NAD Respiratory system: Lungs clear to auscultation bilaterally.  No wheezes, no crackles, no rhonchi.  Normal respiratory effort.   Cardiovascular system: Regular rate rhythm no murmurs rubs or gallops.  No JVD.  No lower extremity edema. Gastrointestinal system: Abdomen is nondistended, soft, decreased tenderness to palpation, positive bowel  sounds. Palpable firm incarcerated lower midline abdominal wall hernia noted.  No rebound.  No guarding.  Central nervous system: Alert and oriented. No focal neurological deficits. Extremities: Symmetric 5 x 5 power. Skin: No rashes, lesions or ulcers Psychiatry: Judgement and insight appear normal. Mood & affect appropriate.     Data Reviewed: I have personally reviewed following labs and imaging studies  CBC: Recent Labs  Lab 09/11/18 0448 09/12/18 0442 09/13/18 0922 09/14/18 0445 09/15/18 0535  WBC 12.0* 14.1* 14.6* 13.4* 11.4*  NEUTROABS 8.5*  --  10.8* 9.5* 7.8*  HGB 14.9 14.3 14.8 14.5 14.1  HCT 46.4* 43.9 45.8 45.7 44.7  MCV 92.6 94.0 90.9 92.9 95.7  PLT 282 249 281 261 219   Basic Metabolic Panel: Recent Labs  Lab 09/10/18 0451 09/11/18 0448 09/12/18 0442 09/13/18 0922 09/14/18 0445 09/15/18 0535  NA 138 141 141 139 138 138  K 3.4* 3.7 3.1* 3.6 3.6 3.7  CL 102 105 107 107 106 106  CO2 23 25 23 23 22  21*  GLUCOSE 138* 131* 123* 133* 110* 107*  BUN 15 14 15 15 12 7   CREATININE 0.70 0.87 0.66 0.66 0.74 0.77  CALCIUM 8.8* 8.6* 8.7* 8.8* 8.4* 8.4*  MG 2.3 2.3 2.4 2.2  --   --    GFR: Estimated Creatinine Clearance: 165.4 mL/min (by C-G formula based on SCr of 0.77 mg/dL). Liver Function Tests: Recent Labs  Lab 09/09/18 1702 09/13/18 0922  AST 34 43*  ALT 33 44  ALKPHOS 42 42  BILITOT 0.9 1.1  PROT 7.7 7.1  ALBUMIN 4.4 3.9   Recent Labs  Lab 09/09/18 1702  LIPASE 33   No results for input(s): AMMONIA in the last 168 hours. Coagulation Profile: Recent Labs  Lab 09/13/18 0922  INR 1.10   Cardiac Enzymes: No results for input(s): CKTOTAL, CKMB, CKMBINDEX, TROPONINI in the last 168 hours. BNP (last 3 results) No results for input(s): PROBNP in the last 8760 hours. HbA1C: No results for input(s): HGBA1C in the last 72 hours. CBG: Recent Labs  Lab 09/14/18 2018 09/15/18 0044 09/15/18 0413 09/15/18 0741 09/15/18 1139  GLUCAP 112* 91 97  99 102*   Lipid Profile: No results for input(s): CHOL, HDL, LDLCALC, TRIG, CHOLHDL, LDLDIRECT in the last 72 hours. Thyroid Function Tests: No results for input(s): TSH, T4TOTAL, FREET4, T3FREE, THYROIDAB in the last 72 hours. Anemia Panel: No results for input(s): VITAMINB12, FOLATE, FERRITIN, TIBC, IRON, RETICCTPCT in the last 72 hours. Sepsis Labs: No results for input(s): PROCALCITON, LATICACIDVEN in the last 168 hours.  Recent Results (from the past 240 hour(s))  Urine culture     Status: Abnormal   Collection Time: 09/09/18  9:14 PM  Result Value Ref Range Status   Specimen Description URINE, CLEAN CATCH  Final   Special Requests   Final    NONE Performed at [facility], [address], [place], [place] [address]    Culture (A)  Final    >=100,000 COLONIES/mL MULTIPLE SPECIES PRESENT, SUGGEST RECOLLECTION  Report Status 09/12/2018 FINAL  Final         Radiology Studies: No results found.      Scheduled Meds: . enoxaparin (LOVENOX) injection  80 mg Subcutaneous Q24H  . famotidine  20 mg Oral BID  . insulin aspart  0-9 Units Subcutaneous Q4H  . metoprolol tartrate  5 mg Intravenous Q8H   Continuous Infusions: . 0.9 % NaCl with KCl 40 mEq / L 75 mL/hr (09/15/18 1100)  . ondansetron (ZOFRAN) IV 8 mg (09/14/18 1651)     LOS: 6 days    Time spent: 35 minutes    Ramiro Harvestaniel Charnetta Wulff, MD [facility]  If 7PM-7AM, please contact night-coverage [email or web address] 09/15/2018, 11:52 AM

## 2018-09-15 NOTE — Progress Notes (Signed)
Pharmacy IV to PO conversion  The patient is receiving Famotidine by the intravenous route.  Based on criteria approved by the Pharmacy and Therapeutics Committee and the Medical Executive Committee, the medication is being converted to the equivalent oral dose form.   No active GI bleeding or impaired absorption  Not s/p esophagectomy  Documented ability to take oral medications for > 24 hr  Plan to continue treatment for at least 1 day  If you have any questions about this conversion, please contact the Pharmacy Department (ext [phone]).  Thank you.  Bernadene Person, PharmD, BCPS [phone] 09/15/2018, 9:19 AM

## 2018-09-15 NOTE — Progress Notes (Signed)
Pt refused bipap tonight.  Pt was advised that RT is available all night should she change her mind.  RN aware.

## 2018-09-15 NOTE — Progress Notes (Signed)
Patient ID: Kelly Vargas, female   DOB: [birth year], 48 y.o.   MRN: [id number] [facility] Surgery Progress Note:   * No surgery found *  Subjective: Feels good today. Had several loose bowel movements and passing flatus. Trying some grits this morning. Still having some colicky pain.    Objective: Vital signs in last 24 hours: Temp:  [97.4 F (36.3 C)-98.4 F (36.9 C)] 97.4 F (36.3 C) (01/14 0627) Pulse Rate:  [74-79] 79 (01/14 0627) Resp:  [16-18] 16 (01/14 0627) BP: (136-164)/(84-106) 136/84 (01/14 0627) SpO2:  [98 %-100 %] 98 % (01/14 0627)  Intake/Output from previous day: 01/13 0701 - 01/14 0700 In: 2782.4 [P.O.:450; I.V.:2128.4; IV Piggyback:204] Out: 300 [Urine:300] Intake/Output this shift: No intake/output data recorded.  Physical Exam: Work of breathing is not labored.  Obese abdomen, hernia vaguely palpable in left midabdomen, mildly tender  Lab Results:  Results for orders placed or performed during the hospital encounter of 09/09/18 (from the past 48 hour(s))  CBC with Differential/Platelet     Status: Abnormal   Collection Time: 09/13/18  9:22 AM  Result Value Ref Range   WBC 14.6 (H) 4.0 - 10.5 K/uL   RBC 5.04 3.87 - 5.11 MIL/uL   Hemoglobin 14.8 12.0 - 15.0 g/dL   HCT 62.1 30.8 - 65.7 %   MCV 90.9 80.0 - 100.0 fL   MCH 29.4 26.0 - 34.0 pg   MCHC 32.3 30.0 - 36.0 g/dL   RDW 84.6 96.2 - 95.2 %   Platelets 281 150 - 400 K/uL   nRBC 0.0 0.0 - 0.2 %   Neutrophils Relative % 74 %   Neutro Abs 10.8 (H) 1.7 - 7.7 K/uL   Lymphocytes Relative 15 %   Lymphs Abs 2.2 0.7 - 4.0 K/uL   Monocytes Relative 7 %   Monocytes Absolute 1.0 0.1 - 1.0 K/uL   Eosinophils Relative 2 %   Eosinophils Absolute 0.3 0.0 - 0.5 K/uL   Basophils Relative 1 %   Basophils Absolute 0.1 0.0 - 0.1 K/uL   Immature Granulocytes 1 %   Abs Immature Granulocytes 0.08 (H) 0.00 - 0.07 K/uL    Comment: Performed at [facility], [address], [place], [place]  [address]  Magnesium     Status: None   Collection Time: 09/13/18  9:22 AM  Result Value Ref Range   Magnesium 2.2 1.7 - 2.4 mg/dL    Comment: Performed at [facility], [address], [place], [place] [address]  Comprehensive metabolic panel     Status: Abnormal   Collection Time: 09/13/18  9:22 AM  Result Value Ref Range   Sodium 139 135 - 145 mmol/L   Potassium 3.6 3.5 - 5.1 mmol/L   Chloride 107 98 - 111 mmol/L   CO2 23 22 - 32 mmol/L   Glucose, Bld 133 (H) 70 - 99 mg/dL   BUN 15 6 - 20 mg/dL   Creatinine, Ser 2.72 0.44 - 1.00 mg/dL   Calcium 8.8 (L) 8.9 - 10.3 mg/dL   Total Protein 7.1 6.5 - 8.1 g/dL   Albumin 3.9 3.5 - 5.0 g/dL   AST 43 (H) 15 - 41 U/L   ALT 44 0 - 44 U/L   Alkaline Phosphatase 42 38 - 126 U/L   Total Bilirubin 1.1 0.3 - 1.2 mg/dL   GFR calc non Af Amer >60 >60 mL/min   GFR calc Af Amer >60 >60 mL/min   Anion gap 9 5 -  15    Comment: Performed at [facility], [address], [place], [place] [address]  Protime-INR     Status: None   Collection Time: 09/13/18  9:22 AM  Result Value Ref Range   Prothrombin Time 14.1 11.4 - 15.2 seconds   INR 1.10     Comment: Performed at [facility], [address], [place], [place] [address]  Glucose, capillary     Status: Abnormal   Collection Time: 09/13/18 11:42 AM  Result Value Ref Range   Glucose-Capillary 123 (H) 70 - 99 mg/dL  Glucose, capillary     Status: Abnormal   Collection Time: 09/13/18  4:02 PM  Result Value Ref Range   Glucose-Capillary 133 (H) 70 - 99 mg/dL  Glucose, capillary     Status: Abnormal   Collection Time: 09/13/18  8:06 PM  Result Value Ref Range   Glucose-Capillary 136 (H) 70 - 99 mg/dL  Glucose, capillary     Status: None   Collection Time: 09/14/18 12:16 AM  Result Value Ref Range   Glucose-Capillary 91 70 - 99 mg/dL  Glucose, capillary     Status: Abnormal   Collection Time: 09/14/18  4:18 AM  Result Value Ref Range    Glucose-Capillary 108 (H) 70 - 99 mg/dL  CBC with Differential/Platelet     Status: Abnormal   Collection Time: 09/14/18  4:45 AM  Result Value Ref Range   WBC 13.4 (H) 4.0 - 10.5 K/uL   RBC 4.92 3.87 - 5.11 MIL/uL   Hemoglobin 14.5 12.0 - 15.0 g/dL   HCT 13.0 86.5 - 78.4 %   MCV 92.9 80.0 - 100.0 fL   MCH 29.5 26.0 - 34.0 pg   MCHC 31.7 30.0 - 36.0 g/dL   RDW 69.6 29.5 - 28.4 %   Platelets 261 150 - 400 K/uL   nRBC 0.0 0.0 - 0.2 %   Neutrophils Relative % 71 %   Neutro Abs 9.5 (H) 1.7 - 7.7 K/uL   Lymphocytes Relative 16 %   Lymphs Abs 2.1 0.7 - 4.0 K/uL   Monocytes Relative 9 %   Monocytes Absolute 1.2 (H) 0.1 - 1.0 K/uL   Eosinophils Relative 3 %   Eosinophils Absolute 0.4 0.0 - 0.5 K/uL   Basophils Relative 1 %   Basophils Absolute 0.1 0.0 - 0.1 K/uL   Immature Granulocytes 0 %   Abs Immature Granulocytes 0.06 0.00 - 0.07 K/uL    Comment: Performed at [facility], [address] [place], [place] [address]  Basic metabolic panel     Status: Abnormal   Collection Time: 09/14/18  4:45 AM  Result Value Ref Range   Sodium 138 135 - 145 mmol/L   Potassium 3.6 3.5 - 5.1 mmol/L   Chloride 106 98 - 111 mmol/L   CO2 22 22 - 32 mmol/L   Glucose, Bld 110 (H) 70 - 99 mg/dL   BUN 12 6 - 20 mg/dL   Creatinine, Ser 0.10 0.44 - 1.00 mg/dL   Calcium 8.4 (L) 8.9 - 10.3 mg/dL   GFR calc non Af Amer >60 >60 mL/min   GFR calc Af Amer >60 >60 mL/min   Anion gap 10 5 - 15    Comment: Performed at [facility], [address] [place], [place] [address]  Glucose, capillary     Status: None   Collection Time: 09/14/18  7:38 AM  Result Value Ref Range   Glucose-Capillary 99 70 -  99 mg/dL  Glucose, capillary     Status: Abnormal   Collection Time: 09/14/18 11:42 AM  Result Value Ref Range   Glucose-Capillary 118 (H) 70 - 99 mg/dL  Glucose, capillary     Status: Abnormal   Collection Time: 09/14/18  3:31 PM  Result Value Ref Range    Glucose-Capillary 105 (H) 70 - 99 mg/dL  Glucose, capillary     Status: Abnormal   Collection Time: 09/14/18  8:18 PM  Result Value Ref Range   Glucose-Capillary 112 (H) 70 - 99 mg/dL  Glucose, capillary     Status: None   Collection Time: 09/15/18 12:44 AM  Result Value Ref Range   Glucose-Capillary 91 70 - 99 mg/dL   Comment 1 Notify RN   Glucose, capillary     Status: None   Collection Time: 09/15/18  4:13 AM  Result Value Ref Range   Glucose-Capillary 97 70 - 99 mg/dL   Comment 1 Notify RN   CBC with Differential/Platelet     Status: Abnormal   Collection Time: 09/15/18  5:35 AM  Result Value Ref Range   WBC 11.4 (H) 4.0 - 10.5 K/uL   RBC 4.67 3.87 - 5.11 MIL/uL   Hemoglobin 14.1 12.0 - 15.0 g/dL   HCT 16.144.7 09.636.0 - 04.546.0 %   MCV 95.7 80.0 - 100.0 fL   MCH 30.2 26.0 - 34.0 pg   MCHC 31.5 30.0 - 36.0 g/dL   RDW 40.913.0 81.111.5 - 91.415.5 %   Platelets 219 150 - 400 K/uL   nRBC 0.0 0.0 - 0.2 %   Neutrophils Relative % 67 %   Neutro Abs 7.8 (H) 1.7 - 7.7 K/uL   Lymphocytes Relative 19 %   Lymphs Abs 2.1 0.7 - 4.0 K/uL   Monocytes Relative 9 %   Monocytes Absolute 1.0 0.1 - 1.0 K/uL   Eosinophils Relative 3 %   Eosinophils Absolute 0.4 0.0 - 0.5 K/uL   Basophils Relative 1 %   Basophils Absolute 0.1 0.0 - 0.1 K/uL   Immature Granulocytes 1 %   Abs Immature Granulocytes 0.07 0.00 - 0.07 K/uL    Comment: Performed at [facility], [address] [place], [place] [address]  Basic metabolic panel     Status: Abnormal   Collection Time: 09/15/18  5:35 AM  Result Value Ref Range   Sodium 138 135 - 145 mmol/L   Potassium 3.7 3.5 - 5.1 mmol/L   Chloride 106 98 - 111 mmol/L   CO2 21 (L) 22 - 32 mmol/L   Glucose, Bld 107 (H) 70 - 99 mg/dL   BUN 7 6 - 20 mg/dL   Creatinine, Ser 6.210.77 0.44 - 1.00 mg/dL   Calcium 8.4 (L) 8.9 - 10.3 mg/dL   GFR calc non Af Amer >60 >60 mL/min   GFR calc Af Amer >60 >60 mL/min   Anion gap 11 5 - 15    Comment: Performed at [facility], [address] [place], [place] [address]  Glucose, capillary     Status: None   Collection Time: 09/15/18  7:41 AM  Result Value Ref Range   Glucose-Capillary 99 70 - 99 mg/dL    Radiology/Results: Dg Abd 2 Views  Result Date: 09/13/2018 CLINICAL DATA:  Followup small bowel obstruction. EXAM: ABDOMEN - 2 VIEW COMPARISON:  09/11/2018 FINDINGS: Nasogastric tube is seen with tip in proximal stomach. Moderately dilated small bowel loops containing air-fluid levels are seen within the left lower  quadrant, with mildly increased number and dilatation since prior study. A small amount of residual contrast is seen in the colon which is nondilated. These findings remain consistent with a distal small bowel obstruction. No evidence of free intraperitoneal air. IMPRESSION: Distal small bowel obstruction, with mild increase in small bowel dilatation since prior study. Nasogastric tube tip in proximal stomach. Electronically Signed   By: Myles RosenthalJohn  Stahl M.D.   On: 09/13/2018 16:24    Anti-infectives: Anti-infectives (From admission, onward)   None      Assessment/Plan: Problem List: Patient Active Problem List   Diagnosis Date Noted  . Generalized abdominal pain   . Type 2 diabetes mellitus without complication, without long-term current use of insulin (HCC)   . Depression   . Anxiety   . SBO (small bowel obstruction) (HCC) 09/09/2018  . Recurrent incisional hernia with incarceration 09/09/2018  . PCOS (polycystic ovarian syndrome) 03/02/2015  . Morbid obesity with BMI of 50.0-59.9, adult (HCC) 03/02/2015  . Hirsutism 03/02/2015  . High blood pressure associated with diabetes (HCC) 03/02/2015    Seems to be improving with nonoperative management. Continue advancing diet, resume PO medications. If continues to improve anticipate discharge in next 24h.   LOS: 6 days   Berna Buehelsea A Schneider Warchol MD, [facility]  [facility] [phone]  09/15/2018 8:10 AM

## 2018-09-16 DIAGNOSIS — K430 Incisional hernia with obstruction, without gangrene: Principal | ICD-10-CM

## 2018-09-16 LAB — BASIC METABOLIC PANEL
Anion gap: 10 (ref 5–15)
BUN: 5 mg/dL — ABNORMAL LOW (ref 6–20)
CO2: 21 mmol/L — AB (ref 22–32)
Calcium: 8.5 mg/dL — ABNORMAL LOW (ref 8.9–10.3)
Chloride: 105 mmol/L (ref 98–111)
Creatinine, Ser: 0.67 mg/dL (ref 0.44–1.00)
GFR calc Af Amer: >60 mL/min (ref >60)
GFR calc non Af Amer: >60 mL/min (ref >60)
Glucose, Bld: 102 mg/dL — ABNORMAL HIGH (ref 70–99)
Potassium: 3.5 mmol/L (ref 3.5–5.1)
Sodium: 136 mmol/L (ref 135–145)

## 2018-09-16 LAB — CBC
HEMATOCRIT: 46.8 % — AB (ref 36.0–46.0)
Hemoglobin: 14.8 g/dL (ref 12.0–15.0)
MCH: 30.1 pg (ref 26.0–34.0)
MCHC: 31.6 g/dL (ref 30.0–36.0)
MCV: 95.3 fL (ref 80.0–100.0)
Platelets: 216 10*3/uL (ref 150–400)
RBC: 4.91 MIL/uL (ref 3.87–5.11)
RDW: 12.9 % (ref 11.5–15.5)
WBC: 9 10*3/uL (ref 4.0–10.5)
nRBC: 0 % (ref 0.0–0.2)

## 2018-09-16 LAB — GLUCOSE, CAPILLARY: Glucose-Capillary: 97 mg/dL (ref 70–99)

## 2018-09-16 LAB — MAGNESIUM: Magnesium: 2.1 mg/dL (ref 1.7–2.4)

## 2018-09-16 MED ORDER — VITAMIN D 125 MCG (5000 UT) PO CAPS: 5000.0000 mg | ORAL_CAPSULE | Freq: Every day | ORAL | 0 refills | Status: AC

## 2018-09-16 NOTE — Discharge Summary (Signed)
Physician Discharge Summary  OCIE KALB MVH:[id number] DOB: 01-30-1971 DOA: 09/09/2018  PCP: Sigmund Hazel, MD  Admit date: 09/09/2018 Discharge date: 09/18/2018  Time spent: 40 minutes  Recommendations for Outpatient Follow-up:  1. Follow up outpatient CBC/CMP  2. Follow up with surgery as outpatient 3. Ensure continued on soft diet x 1 week   Discharge Diagnoses:  Principal Problem:   Recurrent incisional hernia with incarceration Active Problems:   Morbid obesity with BMI of 50.0-59.9, adult (HCC)   High blood pressure associated with diabetes (HCC)   SBO (small bowel obstruction) (HCC)   Generalized abdominal pain   Type 2 diabetes mellitus without complication, without long-term current use of insulin (HCC)   Depression   Anxiety   Discharge Condition: stable  Diet recommendation: soft  Filed Weights   09/10/18 0700  Weight: (!) 188.9 kg    History of present illness:  Kelly Vargas Jeryn Cerney 48 y.o.femalewith medical history significant ofPCOS, DM, HTN. Patient presents to the ED for evaluation of abd pain, N/V. Symptoms onset 4 days ago, worsening. Unable to tolerate anything PO. Does report episodes of diarrhea.   ED Course:CT reveals PSBO likely due to 2 separate hernias contributing (periumbilical and ventral hernias).  She was seen for Essica Kiker small bowel obstruction.  This has gradually improved.  She was seen by surgery who recommending outpatient surgical follow up, she'll be high risk for surgery.  She was improved on day of discharge, tolerating Dorman Calderwood soft diet.  Hospital Course:  1 small bowel obstruction vs partial SBO  periumbilical hernia and ventral hernia  Secondary to 2 hernias.   S/p NG tube Tolerating soft diet on day of discharge Surgery recommending soft diet x1 week.  Recommending follow up with surgery outpatient.    2.  Obstructive sleep apnea BiPAP nightly on hold due to NG tube.  NG tube has been removed.  3.  Diabetes mellitus type  2 Oral hypoglycemic agents on hold.  CBG 99 this morning.  Continue sliding scale insulin.  4.  Hypertension Resume home med  5.  Depression/anxiety Resume home meds   6.  Hypokalemia Replace.  Procedures:  none  Consultations:  Surgery  Discharge Exam: Vitals:   09/16/18 0531 09/16/18 0732  BP: 125/77 118/78  Pulse: 87 76  Resp: 13   Temp: (!) 97.5 F (36.4 C) 98.6 F (37 C)  SpO2: 96% 98%   Feels better.  Ready to go. Tolerated breakfast ok. Discussed discharge plan.  General: No acute distress. Cardiovascular: Heart sounds show Jahmier Willadsen regular rate, and rhythm. Lungs: Clear to auscultation bilaterally Abdomen: Soft, nontender Neurological: Alert and oriented 3. Moves all extremities 4. Cranial nerves II through XII grossly intact. Skin: Warm and dry. No rashes or lesions. Extremities: No clubbing or cyanosis. No edema.  Psychiatric: Mood and affect are normal. Insight and judgment are appropriate.  Discharge Instructions   Discharge Instructions    Call MD for:  difficulty breathing, headache or visual disturbances   Complete by:  As directed    Call MD for:  persistant dizziness or light-headedness   Complete by:  As directed    Call MD for:  persistant nausea and vomiting   Complete by:  As directed    Call MD for:  redness, tenderness, or signs of infection (pain, swelling, redness, odor or green/yellow discharge around incision site)   Complete by:  As directed    Call MD for:  severe uncontrolled pain   Complete by:  As directed  Call MD for:  temperature >100.4   Complete by:  As directed    Diet - low sodium heart healthy   Complete by:  As directed    Discharge instructions   Complete by:  As directed    You were seen for Spenser Harren small bowel obstruction.  This resolved with an NG tube and conservative measures.  Please follow up with surgery as an outpatient.  Return for new, recurrent, or worsening symptoms.  Please ask your PCP to  request records from this hospitalization so they know what was done and what the next steps will be.   Increase activity slowly   Complete by:  As directed      Allergies as of 09/16/2018      Reactions   Chamomile Swelling   Levomenol Swelling   Penicillins Hives   Erythromycin    Morphine Nausea And Vomiting   Pt states morphine makes me have nausea and vomiting.      Medication List    TAKE these medications   acetaminophen 500 MG tablet Commonly known as:  TYLENOL Take 500 mg by mouth daily as needed for mild pain.   ALPRAZolam 0.5 MG tablet Commonly known as:  XANAX Take 0.5 mg by mouth 2 (two) times daily as needed for anxiety.   aspirin 81 MG chewable tablet Chew 81 mg by mouth daily.   DULoxetine 60 MG capsule Commonly known as:  CYMBALTA Take 60 mg by mouth daily.   escitalopram 10 MG tablet Commonly known as:  LEXAPRO Take 10 mg by mouth daily.   famotidine 20 MG tablet Commonly known as:  PEPCID Take 20 mg by mouth daily.   fexofenadine 180 MG tablet Commonly known as:  ALLEGRA Take 180 mg by mouth daily.   hydrochlorothiazide 25 MG tablet Commonly known as:  HYDRODIURIL Take 25 mg by mouth daily.   M-VIT PO Take by mouth.   metFORMIN 500 MG (MOD) 24 hr tablet Commonly known as:  GLUMETZA Take 1 tablet (500 mg total) by mouth 2 (two) times daily with Shanavia Makela meal. What changed:    how much to take  when to take this   spironolactone 50 MG tablet Commonly known as:  ALDACTONE TAKE 1 TABLET BY MOUTH  DAILY. TAKE WITH BREAKFAST. What changed:  See the new instructions.   VENTOLIN IN Inhale 2 Inhalers into the lungs 3 (three) times daily as needed.   vitamin B-12 500 MCG tablet Commonly known as:  CYANOCOBALAMIN Take 500 mcg by mouth daily.   Vitamin D 125 MCG (5000 UT) Caps Take 5,000 mg by mouth daily for 30 days. What changed:  medication strength      Allergies  Allergen Reactions  . Chamomile Swelling  . Levomenol Swelling  .  Penicillins Hives  . Erythromycin   . Morphine Nausea And Vomiting    Pt states morphine makes me have nausea and vomiting.   Follow-up Information    [facility] Follow up.   Specialty:  General Surgery Why:  We help care for you during this hospitalization.  If you are interested in bariatric surgery please call and ask for an appointment. Contact information: [address] [place] [address] [phone]        Boneta Lucks, NP Follow up.   Specialty:  Nurse Practitioner Why:  Call for follow up appointment Contact information: [address] [place] [address] [phone]  The results of significant diagnostics from this hospitalization (including imaging, microbiology, ancillary and laboratory) are listed below for reference.    Significant Diagnostic Studies: Dg Abdomen 1 View  Result Date: 09/10/2018 CLINICAL DATA:  NG tube placement. EXAM: ABDOMEN - 1 VIEW COMPARISON:  CT earlier this day. FINDINGS: Tip and side port of the enteric tube below the diaphragm in the stomach. Fluid-filled dilated bowel on CT is not seen by radiograph. Excreted IV contrast in the renal collecting systems from prior CT. IMPRESSION: Tip and side port of the enteric tube below the diaphragm in the stomach. Electronically Signed   By: Narda Rutherford M.D.   On: 09/10/2018 01:15   Ct Abdomen Pelvis W Contrast  Result Date: 09/09/2018 CLINICAL DATA:  Abdominal pain with nausea, vomiting and diarrhea EXAM: CT ABDOMEN AND PELVIS WITH CONTRAST TECHNIQUE: Multidetector CT imaging of the abdomen and pelvis was performed using the standard protocol following bolus administration of intravenous contrast. CONTRAST:  ISOVUE-300 IOPAMIDOL (ISOVUE-300) INJECTION 61% COMPARISON:  None. FINDINGS: Lower chest: Normal heart size without pericardial effusion or thickening. Bibasilar dependent atelectasis without effusion, pulmonary consolidation or  pneumothorax. No dominant mass. Hepatobiliary: Steatosis of the liver without biliary nor mass. Normal gallbladder free of stones. Pancreas: Normal Spleen: Normal size spleen with adjacent small splenule. Adrenals/Urinary Tract: Normal bilateral adrenal glands and kidneys. No nephrolithiasis nor obstructive uropathy. The urinary bladder is unremarkable for the degree of distention. Stomach/Bowel: Physiologic distention of the stomach with normal small bowel rotation. Mild fluid-filled distention jejunal loops to 3.5 cm in caliber secondary to Anzel Kearse right periumbilical omental fat and small bowel containing hernia, series 4/75. This is likely contributing to dilatation of small bowel and partial or early SBO. No apparent incarceration. Adjacent to this is another ventral hernia containing small bowel and omental fat. There is fecalized material within the segment of small bowel projecting into the hernia as well as just outside the ventral hernia opening. The transverse dimension of the common hernia orifice is 9.5 cm which than separates into the two separate hernia sacs as above described. No large bowel dilatation or herniation is identified. There is colonic diverticulosis along the descending and sigmoid colon. Vascular/Lymphatic: No significant vascular findings are present. No enlarged abdominal or pelvic lymph nodes. Reproductive: Uterus and bilateral adnexa are unremarkable. Other: No free air or free fluid. Subcutaneous soft tissue induration of the included pannus. Musculoskeletal: Degenerative disc disease L4-5 and L5-S1. Facet arthropathy L3 through S1. IMPRESSION: 1. Mild fluid-filled distention of jejunal loops up to 3.5 cm secondary to Nikie Cid right periumbilical omental fat and small bowel containing hernia. This is likely contributing to dilatation of small bowel. No incarceration is identified. Adjacent to this is another ventral hernia containing small bowel and omental fat with small amount of fecalized  material noted. This too is likely contributing to early or partial SBO. 2. Hepatic steatosis. 3. Lower lumbar degenerative disc disease L4-5 and L5-S1. Electronically Signed   By: Tollie Eth M.D.   On: 09/09/2018 22:41   Dg Abd 2 Views  Result Date: 09/13/2018 CLINICAL DATA:  Followup small bowel obstruction. EXAM: ABDOMEN - 2 VIEW COMPARISON:  09/11/2018 FINDINGS: Nasogastric tube is seen with tip in proximal stomach. Moderately dilated small bowel loops containing air-fluid levels are seen within the left lower quadrant, with mildly increased number and dilatation since prior study. Elwin Tsou small amount of residual contrast is seen in the colon which is nondilated. These findings remain consistent with Jakai Risse distal small bowel obstruction.  No evidence of free intraperitoneal air. IMPRESSION: Distal small bowel obstruction, with mild increase in small bowel dilatation since prior study. Nasogastric tube tip in proximal stomach. Electronically Signed   By: Myles Rosenthal M.D.   On: 09/13/2018 16:24   Dg Abd Portable 1v-small Bowel Obstruction Protocol-initial, 8 Hr Delay  Result Date: 09/11/2018 CLINICAL DATA:  8 hour delayed imaging after small bowel protocol. Contrast given at 11:30 Crissa Sowder.m. EXAM: PORTABLE ABDOMEN - 1 VIEW COMPARISON:  05/23/2019 FINDINGS: Dilated small bowel loops are noted in the left hemiabdomen without contrast. Enteric contrast is poorly visualized but is noted within proximal small bowel loops. The gas-filled loops of dilated small bowel in the left hemiabdomen measure up to 3.8 cm caliber. IMPRESSION: Dilated loops of small bowel in the left hemiabdomen without contrast. Findings may reflect small bowel obstruction or ileus. Electronically Signed   By: Tollie Eth M.D.   On: 09/11/2018 17:47   Dg Abd Portable 1v-small Bowel Obstruction Protocol-initial, 8 Hr Delay  Result Date: 09/10/2018 CLINICAL DATA:  Small-bowel obstruction EXAM: PORTABLE ABDOMEN - 1 VIEW COMPARISON:  09/09/2018 FINDINGS:  Gastric catheter is noted within the stomach which is decompressed. Multiple dilated loops of small bowel are again identified similar to that seen on the prior exam. No contrast material is identified. Correlate with clinical history. The overall appearance is similar to that seen on recent CT examination. IMPRESSION: Stable small bowel dilatation predominately on the left similar to that seen on recent CT. No contrast material is noted despite the given clinical history. Clinical correlation is recommended. Electronically Signed   By: Alcide Clever M.D.   On: 09/10/2018 18:46    Microbiology: Recent Results (from the past 240 hour(s))  Urine culture     Status: Abnormal   Collection Time: 09/09/18  9:14 PM  Result Value Ref Range Status   Specimen Description URINE, CLEAN CATCH  Final   Special Requests   Final    NONE Performed at [facility], [address], [place], [place] [address]    Culture (Eufemio Strahm)  Final    >=100,000 COLONIES/mL MULTIPLE SPECIES PRESENT, SUGGEST RECOLLECTION   Report Status 09/12/2018 FINAL  Final     Labs: Basic Metabolic Panel: Recent Labs  Lab 09/11/18 0448 09/12/18 0442 09/13/18 0922 09/14/18 0445 09/15/18 0535 09/16/18 0839  NA 141 141 139 138 138 136  K 3.7 3.1* 3.6 3.6 3.7 3.5  CL 105 107 107 106 106 105  CO2 25 23 23 22  21* 21*  GLUCOSE 131* 123* 133* 110* 107* 102*  BUN 14 15 15 12 7  5*  CREATININE 0.87 0.66 0.66 0.74 0.77 0.67  CALCIUM 8.6* 8.7* 8.8* 8.4* 8.4* 8.5*  MG 2.3 2.4 2.2  --   --  2.1   Liver Function Tests: Recent Labs  Lab 09/13/18 0922  AST 43*  ALT 44  ALKPHOS 42  BILITOT 1.1  PROT 7.1  ALBUMIN 3.9   No results for input(s): LIPASE, AMYLASE in the last 168 hours. No results for input(s): AMMONIA in the last 168 hours. CBC: Recent Labs  Lab 09/11/18 0448 09/12/18 0442 09/13/18 0922 09/14/18 0445 09/15/18 0535 09/16/18 0839  WBC 12.0* 14.1* 14.6* 13.4* 11.4* 9.0  NEUTROABS 8.5*  --  10.8* 9.5*  7.8*  --   HGB 14.9 14.3 14.8 14.5 14.1 14.8  HCT 46.4* 43.9 45.8 45.7 44.7 46.8*  MCV 92.6 94.0 90.9 92.9 95.7 95.3  PLT 282 249 281 261 219 216   Cardiac  Enzymes: No results for input(s): CKTOTAL, CKMB, CKMBINDEX, TROPONINI in the last 168 hours. BNP: BNP (last 3 results) No results for input(s): BNP in the last 8760 hours.  ProBNP (last 3 results) No results for input(s): PROBNP in the last 8760 hours.  CBG: Recent Labs  Lab 09/15/18 0741 09/15/18 1139 09/15/18 1536 09/15/18 2143 09/16/18 0807  GLUCAP 99 102* 112* 110* 97       Signed:  Lacretia Nicksaldwell Powell MD.  [facility] 09/18/2018, 12:23 AM

## 2018-09-16 NOTE — Progress Notes (Addendum)
CC: SBO  Subjective: Patient is doing well this a.m. she has a soft diet in front of her which includes some liquids and some soft solids.  She is done well yesterday.  She is having a fair amount of loose stools.  I told her to expect that.  Objective: Vital signs in last 24 hours: Temp:  [97.5 F (36.4 C)-98.6 F (37 C)] 98.6 F (37 C) (01/15 0732) Pulse Rate:  [76-87] 76 (01/15 0732) Resp:  [13-16] 13 (01/15 0531) BP: (118-152)/(59-98) 118/78 (01/15 0732) SpO2:  [96 %-100 %] 98 % (01/15 0732) Last BM Date: 09/15/18 1620 p.o. 1795 IV 1700 urine BM x11 Afebrile vital signs are stable No labs Last 2 view film 1/12: Ongoing distal small bowel obstruction Intake/Output from previous day: 01/14 0701 - 01/15 0700 In: 3415.5 [P.O.:1620; I.V.:1795.5] Out: 1700 [Urine:1700] Intake/Output this shift: No intake/output data recorded.  General appearance: alert, cooperative and no distress Resp: clear to auscultation bilaterally GI: Soft, large abdomen positive bowel sounds and having bowel movements.  Lab Results:  Recent Labs    09/14/18 0445 09/15/18 0535  WBC 13.4* 11.4*  HGB 14.5 14.1  HCT 45.7 44.7  PLT 261 219    BMET Recent Labs    09/14/18 0445 09/15/18 0535  NA 138 138  K 3.6 3.7  CL 106 106  CO2 22 21*  GLUCOSE 110* 107*  BUN 12 7  CREATININE 0.74 0.77  CALCIUM 8.4* 8.4*   PT/INR Recent Labs    09/13/18 0922  LABPROT 14.1  INR 1.10    Recent Labs  Lab 09/09/18 1702 09/13/18 0922  AST 34 43*  ALT 33 44  ALKPHOS 42 42  BILITOT 0.9 1.1  PROT 7.7 7.1  ALBUMIN 4.4 3.9     Lipase     Component Value Date/Time   LIPASE 33 09/09/2018 1702     Medications: . enoxaparin (LOVENOX) injection  80 mg Subcutaneous Q24H  . famotidine  20 mg Oral BID  . insulin aspart  0-9 Units Subcutaneous TID WC  . metoprolol tartrate  5 mg Intravenous Q8H   . 0.9 % NaCl with KCl 40 mEq / L 75 mL/hr (09/16/18 0024)  . ondansetron [facility]) IV 8 mg  (09/14/18 1651)    Assessment/Plan Morbidly obese, BMI 58 OSA PCOS HTN DM  SBO with periumbilical hernia repair/ventral hernia repair  -SBO protocol initiated yesterday. No Gastografin visualized on 8 hour xray. Continued small bowel dilatation. Will repeat small bowel protocol today.   -Patient is high surgical risk given morbidly obese size and surgery would not be easy to correct this problem.  -IS  -diet advanced to soft 1/14  FEN -IV fluids/soft diet VTE - Lovenox  ID -none currently needed   Plan: Patient wants to get up and walk around some more this morning.  See how she does with the diet.  I told her from our standpoint she can go home.    Recommend she stay on soft diet for at least another week.  We discussed hernia repair, along with needed weight loss.    We also briefly discussed investigating bariatric surgery.  I put our information in the AVS so she can call for an appointment if she would like.    LOS: 7 days    JENNINGS,WILLARD 09/16/2018 [id number]  Agree with above. Multiple people from our group have stressed the need for her to loose weight prior to any hernia repair.  She is doing well enough  to go home today.  Ovidio Kinavid Shakur Lembo, MD, [facility] Pager: [phone] Office phone:  [phone]

## 2018-09-16 NOTE — Progress Notes (Signed)
Discharge instructions discussed with patient and family, verbalized agreement and understanding 

## 2018-09-16 NOTE — Progress Notes (Signed)
Agree with assessment of student RN

## 2018-09-18 NOTE — Telephone Encounter (Signed)
Pt informed

## 2018-10-21 ENCOUNTER — Encounter: Payer: Self-pay | Admitting: Neurology

## 2018-10-26 ENCOUNTER — Ambulatory Visit (INDEPENDENT_AMBULATORY_CARE_PROVIDER_SITE_OTHER): Payer: 59 | Admitting: Neurology

## 2018-10-26 ENCOUNTER — Encounter: Payer: Self-pay | Admitting: Neurology

## 2018-10-26 VITALS — BP 142/90 | HR 79 | Ht 71.0 in | Wt 391.0 lb

## 2018-10-26 DIAGNOSIS — G4731 Primary central sleep apnea: Secondary | ICD-10-CM | POA: Diagnosis not present

## 2018-10-26 DIAGNOSIS — G4733 Obstructive sleep apnea (adult) (pediatric): Secondary | ICD-10-CM

## 2018-10-26 NOTE — Patient Instructions (Signed)
Keep up the good work with the BiPAP ST, I will reduce the pressure to 14/10 cm, due to your weight loss.

## 2018-10-26 NOTE — Progress Notes (Signed)
Subjective:    Patient ID: Kelly Vargas is a 48 y.o. female.  HPI     Interim history:   Kelly Vargas is a 48 year old right-handed woman with an underlying medical history of polycystic ovarian syndrome, IBS, hypertension, reflux disease, obesity, depression and anxiety, PTSD, status post ventral hernia and umbilical hernia repairs, lumbar laminectomy, tonsillectomy and adenoidectomy, who presents for follow-up consultation of her complex sleep apnea, treated with BiPAP ST. The patient is unaccompanied today and presents for yearly checkup. I last saw her on 10/23/2017, at which time she was fully compliant with her BiPAP. Unfortunately, she had a lot of stressors. She was in grad school, she had family members who were battling cancer. She was supposed to establish care with a new GYN and she had been experiencing foot pain. Her A1c had risen.  Today, 10/26/2018: I reviewed her BiPAP compliance data from 09/22/2018 through 10/21/2018 which is a total of 30 days, during which time she used her machine every night with percent used days greater than 4 hours at 100%, indicating superb compliance and high average usage of 11 hours and 29 minutes, residual AHI at goal at 0.4 per hour, leak high, albeit better than before, with the 95th percentile of 36.1 L/m on a pressure of 15/11 with a backup rate of 12. She reports doing well with her BiPAP. Unfortunately, she had to be hospitalized recently. She had a small bowel obstruction, she was in the hospital for about 9 days. She had an incarcerated hernia. She has lost weight. She may be able to get surgery for hernia but she needs to lose weight significantly before surgery can be attempted as I understand. Compared to our visit last year around this time she has lost about 24 pounds.   The patient's allergies, current medications, family history, past medical history, past social history, past surgical history and problem list were reviewed and updated as  appropriate.    Previously:   Of note, she missed an appointment on 08/21/2017. I saw her on 08/21/2016, at which time she was fully compliant with her BiPAP ST. She was doing well overall. She was advised to follow-up routinely in one year. I did suggest we reduced her BiPAP pressure because of high leak. I reduced to 15/11 cm with a rate of 12.   I reviewed her BiPAP compliance data from 09/22/2017 through 10/21/2017 which is a total of 30 days, during which time she used her BiPAP every night with percent used days greater than 4 hours at 100%, indicating superb compliance with an average usage of 8 hours and 26 minutes, residual AHI at goal at 0.5 per hour, leak high with the 95th percentile at 50.8 L/m on a pressure of 15/11 cm with a backup rate of 12.    I saw her on 08/22/2015, at which time she was compliant with BiPAP ST. She had more stress secondary to working full time and she was in online school as well. She had started spironolactone 50 mg once daily and an OCP for her PCOS. She had lost a little bit of weight. She was complaining of urinary urgency and nocturia as well as incontinence at times.    08/21/2016: The reviewed her BiPAP compliance data from 07/20/2016 through 08/18/2016 which is a total of 30 days, during which time she used her BiPAP every night with percent used days greater than 4 hours at 100%, indicating superb compliance with an average usage of 8 hours and 28  minutes, residual AHI low at 0.6 per hour, leak with the 95th percentile at 73 L/m on a pressure of 17/13 with a rate of 12.    I saw her on 02/20/2015, at which time she reported doing fairly well. She was trying to lose weight and had lost about 11 pounds. 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 She was enrolling in the masters program. She was seeking care with a new primary care physician. She was using a small fullface mask. She was compliant with BiPAP treatment. I encouraged her to continue  with treatment and to strive for weight loss.   I reviewed her BiPAP compliance data from 07/22/2015 through 08/20/2015 which is a total of 30 days during which time she used her machine every night with percent used days greater than 4 hours at 97%, indicating excellent compliance with an average usage of 8 hours and 29 minutes, residual AHI low at 0.1 per hour, leak consistently high with the 95th percentile of leak at 65.1 L/m on a pressure of 17/13 with a rate of 12.   I saw her on 10/27/2014, at which time she reported doing fairly well. Unfortunately, leak from the mask was still high. Her lower extremity edema was no better. Her blood pressure values were good. Her nocturia was much improved from before treatment. I requested a reduction in her pressure to 19/15 cm.   I reviewed her BiPAP compliance data from 01/18/2015 through 02/16/2015 which is a total of 30 days during which time she used her machine every night, percent used days greater than 4 hours was under percent, indicating superb compliance with an average usage of 8 hours and 34 minutes for all nights, residual AHI low at 0.2 per hour but leak still high, albeit improved with the 95th percentile at 63.2 L/m. Pressure at 19/15 with a rate of 12.   I saw her on 07/26/2014, at which time we talked about her sleep test results and her compliance data. She had obstructive sleep apnea which did not respond completely to CPAP therapy and standard BiPAP therapy and she started having central apneas on standard BiPAP. She was therefore switched to BiPAP ST. She had good compliance and indicated good results. She reported sleeping better and improvement of her nocturia as well as improved daytime alertness. Leak was high and I suggested we request a mask refit and reduction in BiPAP pressure 19/15 cm but both things were actually not gone. I have emailed her DME provider today. The patient states that she was mailed another fullface mask but this  was even worse in terms of fit and her mouth would come open. She is trying to get on track with her weight loss.    I reviewed her BiPAP ST compliance data from 09/24/2014 through 10/23/2014 which is a total of 30 days during which time she was under percent compliant. Average usage of 7 hours and 52 minutes with a pressure of 21/17 and the rate of 12. Residual AHI at 1 per hour and leak at times high with the 95th percentile at 75.6 L/m.   I first met her on 05/13/2014 at the request of her psychiatrist, at which time the patient reported difficulty with sleep maintenance with frequent nighttime awakenings, snoring, nocturia, and witnessed apneas. I asked her to return for a sleep study. She had a split-night sleep study on 06/07/2014 and went over her test results with her in detail today. Her baseline sleep  efficiency was 62.9% with a latency to sleep of 15.5 minutes and wake after sleep onset of 21 minutes with moderate sleep fragmentation noted. She had an increased percentage of stage I sleep, an increased percentage of slow-wave sleep, and absence of REM sleep. No EKG changes were noted and no significant PLMS. She had loud snoring. She slept on her back only. Her total AHI was 61 per hour. Baseline oxygen saturation was only 88%, nadir was 80%. She was therefore started on CPAP therapy. Sleep efficiency was improved. She had an increased percentage of REM sleep at 39.3%. Average oxygen saturation was 89%, nadir was 78%. The patient was started on CPAP. She was first tried on a nasal mask but had significant mouth opening. She was titrated from 5-15 cm. She had significant residual sleep disordered breathing and therefore was switched to BiPAP therapy. She was titrated from 16/12 cm to 20/16 cm. She had central apneas. She was switched to BiPAP ST. She was titrated to a final pressure of 21/17 with a rate of 12. Residual AHI was 0 on the final setting with her O2 nadir of 89% on the final setting. Based  on her test results I tarted her on BiPAP ST at home.   I reviewed her BiPAP compliance data from 06/21/2014 through 07/20/2014 which is a total of 30 days during which time she was under percent compliant. Percent used days greater than 4 hours was 100%, indicating superb compliance. Average usage of 8 hours and 17 minutes. Pressure at 21/17 with a rate of 12. Residual AHI 1 per hour, leak was high.   She works from home for [employer]. She has had sleep walking and sleep eating into adulthood. She has tried Nyquil, Xanax, benadryl. She used to get morning HAs, but these improved since she starting using a custom made bite guard for bruxism. She has had nocturia x 2-3 times, but also nocturnal incontinence. She denies RLS symptoms and is not known to kick in her sleep.   Her typical bedtime is reported to be around MN and usual wake time is around 7:30 AM. Sleep onset typically occurs within minutes after reading on her Kindle. She reports feeling poorly rested upon awakening. She wakes up on an average 3 times in the middle of the night and has to go to the bathroom 2-3 times on a typical night.   She reports excessive daytime somnolence (EDS) and Her Epworth Sleepiness Score (ESS) is 6/24 today. She has not fallen asleep while driving. The patient has been taking a nap during lunch time at times.   She has been known to snore for the past many years. Snoring is reportedly marked, and associated with choking sounds and witnessed apneas. The patient admits to a sense of choking or strangling feeling. There is report of nighttime reflux, and she has to sleep on 2 pillows. She prefers to sleep on the R side. There is no nighttime cough experienced. The patient has not noted any RLS symptoms and is not known to kick while asleep or before falling asleep. There is family history of insomnia, but no obvious RLS or OSA.   She is a restless sleeper and in the morning, the bed is quite disheveled.    She denies  cataplexy, sleep paralysis, hypnagogic or hypnopompic hallucinations, or sleep attacks. She does report any vivid dreams, some nightmares, no dream enactments, but parasomnias, such as sleep eating, and sleep walking. The patient has not had a sleep study or  a home sleep test.   She consumes 5-6 caffeinated beverages per day, usually in the form of coffee and sodas, as late as dinner. Her bedroom is usually dark and cool. There is no TV in the bedroom.     Her Past Medical History Is Significant For: Past Medical History:  Diagnosis Date  . Anxiety   . Asthma   . Depression   . Diabetes mellitus without complication ([place])   . GERD (gastroesophageal reflux disease)   . Hernia   . Hypertension   . IBS (irritable bowel syndrome)   . Migraine   . Morbid obesity ([place])   . Polycystic ovarian disease   . Rosacea   . Sleep apnea     Her Past Surgical History Is Significant For: Past Surgical History:  Procedure Laterality Date  . ADENOIDECTOMY    . BACK SURGERY    . HERNIA REPAIR    . TONSILECTOMY, ADENOIDECTOMY, BILATERAL MYRINGOTOMY AND TUBES    . UMBILICAL HERNIA REPAIR    . ventrical hernia repair      Her Family History Is Significant For: Family History  Problem Relation Age of Onset  . Heart disease Mother   . Cancer Mother   . Stroke Mother   . Diabetes Mother   . Hypertension Mother   . Heart disease Father   . Hypertension Brother   . High Cholesterol Brother   . Depression Brother     Her Social History Is Significant For: Social History   Socioeconomic History  . Marital status: Legally Separated    Spouse name: [name]  . Number of children: 0  . Years of education: [employer].  . Highest education level: Not on file  Occupational History    Employer: [employer]  . Financial resource strain: Not on file  . Food insecurity:    Worry: Not on file    Inability: Not on file  . Transportation needs:    Medical: Not on file    Non-medical:  Not on file  Tobacco Use  . Smoking status: Former Smoker    Packs/day: 1.00    Years: 20.00    Pack years: 20.00    Types: Cigarettes    Last attempt to quit: 09/02/2006    Years since quitting: 12.1  . Smokeless tobacco: Never Used  Substance and Sexual Activity  . Alcohol use: Yes    Alcohol/week: 0.0 standard drinks    Comment: occasionally  . Drug use: No  . Sexual activity: Yes    Partners: Female  Lifestyle  . Physical activity:    Days per week: Not on file    Minutes per session: Not on file  . Stress: Not on file  Relationships  . Social connections:    Talks on phone: Not on file    Gets together: Not on file    Attends religious service: Not on file    Active member of club or organization: Not on file    Attends meetings of clubs or organizations: Not on file    Relationship status: Not on file  Other Topics Concern  . Not on file  Social History Narrative   Patient lives at home with spouse.   Caffeine Use: 5-6 cups of coffee daily    Her Allergies Are:  Allergies  Allergen Reactions  . Chamomile Swelling  . Levomenol Swelling  . Penicillins Hives  . Erythromycin   . Morphine Nausea And Vomiting    Pt states  morphine makes me have nausea and vomiting.  :   Her Current Medications Are:  Outpatient Encounter Medications as of 10/26/2018  Medication Sig  . acetaminophen (TYLENOL) 500 MG tablet Take 500 mg by mouth daily as needed for mild pain.  . Albuterol (VENTOLIN IN) Inhale 2 Inhalers into the lungs 3 (three) times daily as needed.    . ALPRAZolam (XANAX) 0.5 MG tablet Take 0.5 mg by mouth 2 (two) times daily as needed for anxiety.   Marland Kitchen aspirin 81 MG chewable tablet Chew 81 mg by mouth daily.  . cyanocobalamin 500 MCG tablet Take 500 mcg by mouth daily.  . DULoxetine (CYMBALTA) 60 MG capsule Take 60 mg by mouth daily.  Marland Kitchen escitalopram (LEXAPRO) 10 MG tablet Take 10 mg by mouth daily.  . famotidine (PEPCID) 20 MG tablet Take 20 mg by mouth daily.    . fexofenadine (ALLEGRA) 180 MG tablet Take 180 mg by mouth daily.  . hydrochlorothiazide (HYDRODIURIL) 25 MG tablet Take 25 mg by mouth daily.  . metFORMIN (GLUMETZA) 500 MG (MOD) 24 hr tablet Take 1 tablet (500 mg total) by mouth 2 (two) times daily with a meal. (Patient taking differently: Take 1,000 mg by mouth. )  . Prenatal Vit-Fe Fumarate-FA (M-VIT PO) Take by mouth.    . spironolactone (ALDACTONE) 50 MG tablet TAKE 1 TABLET BY MOUTH  DAILY. TAKE WITH BREAKFAST. (Patient taking differently: Take 50 mg by mouth daily. )   No facility-administered encounter medications on file as of 10/26/2018.   :  Review of Systems:  Out of a complete 14 point review of systems, all are reviewed and negative with the exception of these symptoms as listed below: Review of Systems  Neurological:       Pt presents today to discuss her bipap. Pt reports that he bipap is going well.    Objective:  Neurological Exam  Physical Exam Physical Examination:   Vitals:   10/26/18 1147  BP: (!) 142/90  Pulse: 79    General Examination: The patient is a very pleasant 47 y.o. female in no acute distress. She appears well-developed and well-nourished and well groomed.   HEENT:Normocephalic, atraumatic, pupils are equal, round and reactive to light and accommodation. Extraocular tracking is good without limitation to gaze excursion or nystagmus noted. Normal smooth pursuit is noted. Hearing is grossly intact. Face is symmetric with normal facial animation and normal facial sensation. Speech is clear with no dysarthria noted. There is no hypophonia. There is no lip, neck/head, jaw or voice tremor. Neck is supple with full range of passive and active motion. There are no carotid bruits on auscultation. Oropharynx exam reveals: moderate mouth dryness, adequate dental hygiene and mild to moderate airway crowding.Tongue protrudes centrally and palate elevates symmetrically. Tonsils are absent.   Chest:Clear to  auscultation without wheezing, rhonchi or crackles noted.  Heart:S1+S2+0, regular and normal without murmurs, rubs or gallops noted.   Abdomen:Soft, non-tender and non-distended with normal bowel sounds appreciated on auscultation. Right lower abdominal protrusion from the hernia.  Extremities:There is trace edema in the distal lower extremities bilaterally.   Skin: Warm and dry. Normal appearing hands and fingers.   Musculoskeletal: exam reveals no obvious joint deformities, tenderness or joint swelling or erythema.   Neurologically:  Mental status: The patient is awake, alert and oriented in all 4 spheres. Her immediate and remote memory, attention, language skills and fund of knowledge are appropriate. There is no evidence of aphasia, agnosia, apraxia or anomia. Speech is clear  with normal prosody and enunciation. Thought process is linear. Mood is normal and affect is normal.  Cranial nerves II - XII are as described above under HEENT exam.  Motor exam: Normal bulk, strength and tone is noted. There is no drift, tremor or rebound. Reflexes are 1+ throughout. Fine motor skills and coordination: grossly intact.  Cerebellar testing: No dysmetria or intention tremor.  Sensory exam: intact to light touch in the upper and lower extremities.  Gait, station and balance: She stands easily. No veering to one side is noted. No leaning to one side is noted. Posture is age-appropriate and stance is narrow based. Gait shows normal stride length and normal pace. No problems turning are noted.  Assessment and Plan:   In summary, KEIMORA SWARTOUT is a very pleasant 48 year old female with an underlying medical history ofpolycystic ovarian syndrome, IBS, hypertension, reflux disease, morbid obesity, depression and anxiety, status post ventral hernia and umbilical hernia repairs, lumbar laminectomy, tonsillectomy and adenoidectomy,and recent hospitalization for small bowel obstruction, who  presents for follow up consultation of her severe sleep disordered breathing with evidence of severe central apneas with CPAP. She has done well with BiPAP ST. She has been fully compliant with treatment. We reduced her pressure in 2016, due to excess air leak from the mask. She is still experiencing a lot of air leakage from the mask, but leak is overall better than before. She has been using a full facemask. She uses a chinstrap as well. She continues to benefit from treatment. She has lost weight. For that reason I would like to reduce her pressure from 15/11 cm to 14/10 cm in particular since she also worries about increased abdominal pressure what with her hernia. Will keep her backup rate at 12/m. Of note, she had a split-night sleep study on 06/07/2014. She has a history of severe central apnea while on CPAP. She was tried on BiPAP, but did best on BiPAP ST. Residual AHI is less than 1 per hour. We reduced her pressure to 15/11 in 2017, rate at 12/m. She is advised to follow-up in one year. I answered all her questions today and she was in agreement. I spent 15 minutes in total face-to-face time with the patient, more than 50% of which was spent in counseling and coordination of care, reviewing test results, reviewing medication and discussing or reviewing the diagnosis of OSA, CSA, the prognosis and treatment options. Pertinent laboratory and imaging test results that were available during this visit with the patient were reviewed by me and considered in my medical decision making (see chart for details).

## 2019-02-09 NOTE — H&P (Signed)
48yo G1P0010 who presents for hysteroscopy, D&C, myosure polypectomy due to thickened lining with suspected uterine polyp.   In review, she was noted to have a thickened endometrium: 02/02/2019 US/SHG: 7.6cm retroverted uterus with thickened endometrium hyperechoic mass 3.5cm on posterior wall- no blood flow noted. Multiple cystic areas within mass. Left ovary with 4.2cm cyst with thin septation-avascular. Normal right ovary EMB- benign polyp She reports long-standing h/o PCOS and has not had a period for over 2 years. She only noted slight spotting following endometrial biopsy.  No other acute gynecologic concerns    Current Medications  Taking   Hydrochlorothiazide-25 mg 25 mg Tablet 1 tablet Orally Once a day   MetFORMIN HCl ER 500 MG Tablet Extended Release 24 Hour 2 tablet Orally twice a day   Spironolactone 50 MG Tablet 1 tablet Orally Once a day, Notes: GYN   OneTouch Verio(Blood Glucose Test) - Strip as directed In Vitro daily   OneTouch Delica Lancets 59R - Miscellaneous as directed daily   Albuterol Sulfate HFA 108 (90 Base) MCG/ACT Aerosol Solution 2 puffs as needed Inhalation every 4 hrs   Vitamin D 5000 Tablet 1 tablet Orally Once a day   Pepcid(Famotidine) 20 MG Tablet 1 tablet at bedtime Orally Once a day   Aspirin 81 MG Tablet 1 tablet Orally Once a day   Xanax(ALPRAZolam) 0.5 MG Tablet 1 tablet Orally Three times a day prn, Notes: Dr. Toy Care   Cymbalta(DULoxetine HCl) 60 MG Capsule Delayed Release Particles 1 capsule Orally Once a day, Notes: Dr. Toy Care   Lexapro(Escitalopram Oxalate) 10 MG Tablet 1 tablet Orally Once a day, Notes: Dr. Toy Care   Benadryl Allergy(diphenhydrAMINE HCl) 25 MG Tablet 1 tablet as needed Orally every 8 hrs   Fish Oil 1000 MG Capsule 1 capsule Orally Once a day   Hyoscyamine Sulfate 0.125 MG Tablet Sublingual 1 tablet under the tongue and allow to dissolve as needed Sublingual every 4 hrs   Multivitamin . Tablet 1 tablet by mouth Once daily   OneTouch  Verio(Blood Glucose Test) w/Device Kit as directed   OneTouch Verio(Blood Glucose Test) High Solution as directed In Vitro Use High solution weekly to maintain quality setting of blood sugar meter   OneTouch Verio(Blood Glucose Test) - Solution as directed In Vitro Use Low solution weekly to maintain quality setting of blood sugar meter   Singulair(Montelukast Sodium) 10 MG Tablet 1 tablet Orally Once a day, Notes: brake   Vitamin B Complex - Tablet as directed Orally   Acetaminophen 500 MG Tablet 1 tablet as needed Orally every 6 hrs, Notes: Listed in Epic   [employer] Rice 600 MG Tablet as directed Orally   Medication List reviewed and reconciled with the patient    Past Medical History  HTN.   Mild intermittent asthma.   AR.   GERD.   Anxiety and depression (psych).   PCOS since early 20's.   OSA on BPAP.   Prediabetes.   Morbid obesity.   Small bowel obstruction in 09/2018 (no surgery yet).           Surgical History  lumbar laminectomy l4-l5 4163  T&A 8453  Umbilical hernia repair 6468  ventral hernia repair 2012   Family History  Father: alive 75 yrs, unknown  Mother: alive 63 yrs, dvt, PE, PVD, neuropathy, cervical cancer, diagnosed with Diabetes, Hypertension, CVA  Paternal [place] Father: deceased  Paternal Grand Mother: deceased  Maternal Grand Father: deceased  Maternal Grand Mother: deceased  Brother 1: alive 47  yrs, depression/ anxiety, high cholesterol, Diabetes, Hypertension  1 brother(s) .   female in family- uterine & bladder prolapse.   Social History  General:  no EXPOSURE TO PASSIVE SMOKE.  Alcohol: yes, Rare.  Children: none.  Caffeine: yes, coffee, soda, tea, 2+ servings daily.  no DIET.  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  Tobacco use  cigarettes: Former smoker Quit in year 2007 Tobacco history last updated 02/03/2019 Vaping No Marital Status: married, same sex spouse.  no  Recreational drug use.  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  no Exercise.    Gyn History  Sexual activity currently sexually active - female partner.  Periods : no period since 2018.  Denies H/O Birth control.  Last pap smear date 2018 - WNL.  Last mammogram date 2018.  Denies H/O Abnormal pap smear.  STD pubic lice as teen.  Menarche 42.    OB History  miscarriages 1.  Pregnancy # 1 miscarriage with D & C age 41.    Allergies  Penicillin (for allergy): hives  Erythromycin: vomiting  food allergies Chamomile / dust mold: lips swelling/mouth swelling   Hospitalization/Major Diagnostic Procedure  umbilical repair   T & A   inguinal hernia repair   lumbar laminectomy   small bowel obstruction    Review of Systems  CONSTITUTIONAL:  no Chills. no Fever. no Night sweats.  HEENT:  Blurrred vision no. no Double vision.  CARDIOLOGY:  no Chest pain.  RESPIRATORY:  no Shortness of breath. no Cough. Wheezing yes.  UROLOGY:  no Urinary frequency. no Urinary incontinence. no Urinary urgency.  GASTROENTEROLOGY:  no Abdominal pain. no Appetite change. Bloating/belching yes. no Change in bowel movements. Constipation yes.  FEMALE REPRODUCTIVE:  no Breast lumps or discharge. no Breast pain.  NEUROLOGY:  no Dizziness. Headache yes. no Loss of consciousness.  PSYCHOLOGY:  Anxiety yes. Depression yes.  SKIN:  no Rash. no Hives.  ENDOCRINOLOGY:  Hair loss yes.  HEMATOLOGY/LYMPH:  no Anemia. Fatigue yes. Using Blood Thinners yes- ASA daily.     Vital Signs  Wt 400.5, Wt change 4.7 lb, Ht 71, BMI 55.85, Temp 97.6, Pulse sitting 78, BP sitting 116/80.   Examination  General Examination: CONSTITUTIONAL: well developed, well nourished.  SKIN: warm and dry, no rashes.  NECK: supple, normal appearance.  LUNGS: regular breathing rate and effort.  HEART: no murmurs, regular rate and rhythm.  ABDOMEN: morbidly obese, large ventral hernia- non-tender, no  rebound, no guarding.  FEMALE GENITOURINARY: normal external genitalia, labia - unremarkable, vagina - pink moist mucosa, no lesions or abnormal discharge, cervix - no discharge or lesions or CMT, uterus - nontender and normal size on palpation, bimanual exam limited due to body habitus.  MUSCULOSKELETAL no calf tenderness bilaterally.  EXTREMITIES: no edema present.  PSYCH: appropriate mood and affect.     A/P: 48yo female for hysteroscopy, D&C, myosure polypectomy -NPO -LR @ 125cc/hr -SCDs to OR -Risk/benefit and alternatives reviewed with patient including but not limited to risk of bleeding, infection, uterine perforation and potential injury to other organs.  Questions and concerns were addressed and she desires to proceed  Janyth Pupa, DO [phone] (cell) [phone] (office)

## 2019-02-12 NOTE — Pre-Procedure Instructions (Signed)
[id number], [place] - [address] AT [facility] [id number] [place] [id number] Phone: [phone] Fax: [phone]      Your procedure is scheduled on Wednesday, June 17th.  [facility] "A" at 12:20 P.M., and check in at the Admitting office.  Call this number if you have problems the morning of surgery:  [phone]  Call [phone] if you have any questions prior to your surgery date Monday-Friday 8am-4pm    Remember:  Do not eat after midnight.  You may drink clear liquids until 4:30 A.M.   Clear liquids allowed are: Water, Non-Citrus Juices (without pulp), Carbonated Beverages, Clear Tea, Black Coffee Only, and Gatorade    Take these medicines the morning of surgery with A SIP OF WATER  ALPRAZolam (XANAX)-as needed acetaminophen (TYLENOL)-as needed DULoxetine (CYMBALTA) escitalopram (LEXAPRO)  montelukast (SINGULAIR)  Albuterol inhaler as needed. Please bring with you to the hospital.   Follow your surgeon's instructions on when to stop Aspirin.  If no instructions were given by your surgeon then you will need to call the office to get those instructions.    As of today, STOP taking any Aspirin (unless otherwise instructed by your surgeon), Aleve, Naproxen, Ibuprofen, Motrin, Advil, Goody's, BC's, all herbal medications, fish oil, and all vitamins.   WHAT DO I DO ABOUT MY DIABETES MEDICATION?   Marland Kitchen. Do not take oral diabetes medicines (pills) the morning of surgery. metFORMIN (GLUCOPHAGE-XR).    How to Manage Your Diabetes Before and After Surgery  Why is it important to control my blood sugar before and after surgery? . Improving blood sugar levels before and after surgery helps healing and can limit problems. . A way of improving blood sugar control is eating a healthy diet by: o  Eating less sugar and carbohydrates o  Increasing activity/exercise o  Talking with your  doctor about reaching your blood sugar goals . High blood sugars (greater than 180 mg/dL) can raise your risk of infections and slow your recovery, so you will need to focus on controlling your diabetes during the weeks before surgery. . Make sure that the doctor who takes care of your diabetes knows about your planned surgery including the date and location.  How do I manage my blood sugar before surgery? . Check your blood sugar at least 4 times a day, starting 2 days before surgery, to make sure that the level is not too high or low. o Check your blood sugar the morning of your surgery when you wake up and every 2 hours until you get to the Short Stay unit. . If your blood sugar is less than 70 mg/dL, you will need to treat for low blood sugar: o Do not take insulin. o Treat a low blood sugar (less than 70 mg/dL) with  cup of clear juice (cranberry or apple), 4 glucose tablets, OR glucose gel. o Recheck blood sugar in 15 minutes after treatment (to make sure it is greater than 70 mg/dL). If your blood sugar is not greater than 70 mg/dL on recheck, call [phone] for further instructions. . Report your blood sugar to the short stay nurse when you get to Short Stay.  . If you are admitted to the hospital after surgery: o Your blood sugar will be checked by the staff and you will probably be given insulin after surgery (instead of oral diabetes medicines) to make sure you have good blood sugar levels. o  The goal for blood sugar control after surgery is 80-180 mg/dL.   The Morning of Surgery  Do not wear jewelry, make-up or nail polish.  Do not wear lotions, powders, or perfumes/colognes, or deodorant  Do not shave 48 hours prior to surgery.  Men may shave face and neck.  Do not bring valuables to the hospital.  [facility] is not responsible for any belongings or valuables.  If you are a smoker, DO NOT Smoke 24 hours prior to surgery IF you wear a CPAP at night please bring your mask,  tubing, and machine the morning of surgery   Remember that you must have someone to transport you home after your surgery, and remain with you for 24 hours if you are discharged the same day.   Contacts, glasses, hearing aids, dentures or bridgework may not be worn into surgery.    Leave your suitcase in the car.  After surgery it may be brought to your room.  For patients admitted to the hospital, discharge time will be determined by your treatment team.  Patients discharged the day of surgery will not be allowed to drive home.    Special instructions:   - Preparing For Surgery  Before surgery, you can play an important role. Because skin is not sterile, your skin needs to be as free of germs as possible. You can reduce the number of germs on your skin by washing with CHG (chlorahexidine gluconate) Soap before surgery.  CHG is an antiseptic cleaner which kills germs and bonds with the skin to continue killing germs even after washing.    Oral Hygiene is also important to reduce your risk of infection.  Remember - BRUSH YOUR TEETH THE MORNING OF SURGERY WITH YOUR REGULAR TOOTHPASTE  Please do not use if you have an allergy to CHG or antibacterial soaps. If your skin becomes reddened/irritated stop using the CHG.  Do not shave (including legs and underarms) for at least 48 hours prior to first CHG shower. It is OK to shave your face.  Please follow these instructions carefully.   1. Shower the NIGHT BEFORE SURGERY and the MORNING OF SURGERY with CHG Soap.   2. If you chose to wash your hair, wash your hair first as usual with your normal shampoo.  3. After you shampoo, rinse your hair and body thoroughly to remove the shampoo.  4. Use CHG as you would any other liquid soap. You can apply CHG directly to the skin and wash gently with a scrungie or a clean washcloth.   5. Apply the CHG Soap to your body ONLY FROM THE NECK DOWN.  Do not use on open wounds or open sores.  Avoid contact with your eyes, ears, mouth and genitals (private parts). Wash Face and genitals (private parts)  with your normal soap.   6. Wash thoroughly, paying special attention to the area where your surgery will be performed.  7. Thoroughly rinse your body with warm water from the neck down.  8. DO NOT shower/wash with your normal soap after using and rinsing off the CHG Soap.  9. Pat yourself dry with a CLEAN TOWEL.  10. Wear CLEAN PAJAMAS to bed the night before surgery, wear comfortable clothes the morning of surgery  11. Place CLEAN SHEETS on your bed the night of your first shower and DO NOT SLEEP WITH PETS.    Day of Surgery:  Do not apply any deodorants/lotions.  Please wear clean clothes to the hospital/surgery center.  Remember to brush your teeth WITH YOUR REGULAR TOOTHPASTE.   Please read over the following fact sheets that you were given.

## 2019-02-13 ENCOUNTER — Other Ambulatory Visit (HOSPITAL_COMMUNITY)
Admission: RE | Admit: 2019-02-13 | Discharge: 2019-02-13 | Disposition: A | Discharge status: Home / Self Care | Payer: 59 | Source: Ambulatory Visit | Attending: Obstetrics & Gynecology | Admitting: Obstetrics & Gynecology

## 2019-02-13 DIAGNOSIS — Z1159 Encounter for screening for other viral diseases: Secondary | ICD-10-CM | POA: Diagnosis not present

## 2019-02-15 ENCOUNTER — Encounter (HOSPITAL_COMMUNITY): Payer: Self-pay

## 2019-02-15 ENCOUNTER — Encounter (HOSPITAL_COMMUNITY)
Admission: RE | Admit: 2019-02-15 | Discharge: 2019-02-15 | Disposition: A | Discharge status: Home / Self Care | Payer: 59 | Source: Ambulatory Visit | Attending: Obstetrics & Gynecology | Admitting: Obstetrics & Gynecology

## 2019-02-15 ENCOUNTER — Other Ambulatory Visit: Payer: Self-pay

## 2019-02-15 DIAGNOSIS — Z8249 Family history of ischemic heart disease and other diseases of the circulatory system: Secondary | ICD-10-CM | POA: Diagnosis not present

## 2019-02-15 DIAGNOSIS — E119 Type 2 diabetes mellitus without complications: Secondary | ICD-10-CM | POA: Insufficient documentation

## 2019-02-15 DIAGNOSIS — Z87891 Personal history of nicotine dependence: Secondary | ICD-10-CM | POA: Insufficient documentation

## 2019-02-15 DIAGNOSIS — Z01818 Encounter for other preprocedural examination: Secondary | ICD-10-CM | POA: Insufficient documentation

## 2019-02-15 DIAGNOSIS — E282 Polycystic ovarian syndrome: Secondary | ICD-10-CM | POA: Insufficient documentation

## 2019-02-15 DIAGNOSIS — R51 Headache: Secondary | ICD-10-CM | POA: Diagnosis not present

## 2019-02-15 DIAGNOSIS — Z8049 Family history of malignant neoplasm of other genital organs: Secondary | ICD-10-CM | POA: Diagnosis not present

## 2019-02-15 DIAGNOSIS — R9389 Abnormal findings on diagnostic imaging of other specified body structures: Secondary | ICD-10-CM | POA: Insufficient documentation

## 2019-02-15 DIAGNOSIS — K219 Gastro-esophageal reflux disease without esophagitis: Secondary | ICD-10-CM | POA: Insufficient documentation

## 2019-02-15 DIAGNOSIS — Z833 Family history of diabetes mellitus: Secondary | ICD-10-CM | POA: Diagnosis not present

## 2019-02-15 DIAGNOSIS — N854 Malposition of uterus: Secondary | ICD-10-CM | POA: Diagnosis not present

## 2019-02-15 DIAGNOSIS — Z79899 Other long term (current) drug therapy: Secondary | ICD-10-CM | POA: Insufficient documentation

## 2019-02-15 DIAGNOSIS — Z8489 Family history of other specified conditions: Secondary | ICD-10-CM | POA: Diagnosis not present

## 2019-02-15 DIAGNOSIS — N840 Polyp of corpus uteri: Secondary | ICD-10-CM | POA: Insufficient documentation

## 2019-02-15 DIAGNOSIS — Z91018 Allergy to other foods: Secondary | ICD-10-CM | POA: Diagnosis not present

## 2019-02-15 DIAGNOSIS — G4733 Obstructive sleep apnea (adult) (pediatric): Secondary | ICD-10-CM | POA: Insufficient documentation

## 2019-02-15 DIAGNOSIS — F329 Major depressive disorder, single episode, unspecified: Secondary | ICD-10-CM | POA: Diagnosis not present

## 2019-02-15 DIAGNOSIS — I10 Essential (primary) hypertension: Secondary | ICD-10-CM | POA: Insufficient documentation

## 2019-02-15 DIAGNOSIS — F419 Anxiety disorder, unspecified: Secondary | ICD-10-CM | POA: Diagnosis not present

## 2019-02-15 DIAGNOSIS — J4520 Mild intermittent asthma, uncomplicated: Secondary | ICD-10-CM | POA: Diagnosis not present

## 2019-02-15 DIAGNOSIS — J45909 Unspecified asthma, uncomplicated: Secondary | ICD-10-CM | POA: Insufficient documentation

## 2019-02-15 DIAGNOSIS — Z7984 Long term (current) use of oral hypoglycemic drugs: Secondary | ICD-10-CM | POA: Insufficient documentation

## 2019-02-15 DIAGNOSIS — Z6841 Body Mass Index (BMI) 40.0 and over, adult: Secondary | ICD-10-CM | POA: Diagnosis not present

## 2019-02-15 DIAGNOSIS — R7303 Prediabetes: Secondary | ICD-10-CM | POA: Diagnosis not present

## 2019-02-15 DIAGNOSIS — Z7982 Long term (current) use of aspirin: Secondary | ICD-10-CM | POA: Insufficient documentation

## 2019-02-15 DIAGNOSIS — Z881 Allergy status to other antibiotic agents status: Secondary | ICD-10-CM | POA: Diagnosis not present

## 2019-02-15 HISTORY — DX: Nausea with vomiting, unspecified: R11.2

## 2019-02-15 HISTORY — DX: Other specified postprocedural states: Z98.890

## 2019-02-15 HISTORY — DX: Other complications of anesthesia, initial encounter: T88.59XA

## 2019-02-15 LAB — NOVEL CORONAVIRUS, NAA (HOSP ORDER, SEND-OUT TO REF LAB; TAT 18-24 HRS): SARS-CoV-2, NAA: NOT DETECTED

## 2019-02-15 LAB — COMPREHENSIVE METABOLIC PANEL
ALT: 28 U/L (ref 0–44)
AST: 30 U/L (ref 15–41)
Albumin: 4 g/dL (ref 3.5–5.0)
Alkaline Phosphatase: 41 U/L (ref 38–126)
Anion gap: 14 (ref 5–15)
BUN: 11 mg/dL (ref 6–20)
CO2: 20 mmol/L — ABNORMAL LOW (ref 22–32)
Calcium: 9.4 mg/dL (ref 8.9–10.3)
Chloride: 103 mmol/L (ref 98–111)
Creatinine, Ser: 0.75 mg/dL (ref 0.44–1.00)
GFR calc Af Amer: >60 mL/min (ref >60)
GFR calc non Af Amer: >60 mL/min (ref >60)
Glucose, Bld: 125 mg/dL — ABNORMAL HIGH (ref 70–99)
Potassium: 4 mmol/L (ref 3.5–5.1)
Sodium: 137 mmol/L (ref 135–145)
Total Bilirubin: 0.9 mg/dL (ref 0.3–1.2)
Total Protein: 6.9 g/dL (ref 6.5–8.1)

## 2019-02-15 LAB — CBC
HCT: 45.3 % (ref 36.0–46.0)
Hemoglobin: 15 g/dL (ref 12.0–15.0)
MCH: 30 pg (ref 26.0–34.0)
MCHC: 33.1 g/dL (ref 30.0–36.0)
MCV: 90.6 fL (ref 80.0–100.0)
Platelets: 236 10*3/uL (ref 150–400)
RBC: 5 MIL/uL (ref 3.87–5.11)
RDW: 12.7 % (ref 11.5–15.5)
WBC: 7.6 10*3/uL (ref 4.0–10.5)
nRBC: 0 % (ref 0.0–0.2)

## 2019-02-15 LAB — GLUCOSE, CAPILLARY: Glucose-Capillary: 129 mg/dL — ABNORMAL HIGH (ref 70–99)

## 2019-02-15 LAB — TYPE AND SCREEN
ABO/RH(D): A POS
Antibody Screen: NEGATIVE

## 2019-02-15 LAB — ABO/RH: ABO/RH(D): A POS

## 2019-02-15 NOTE — Progress Notes (Signed)
  Coronavirus Screening  Pt tested on 02-13-19 for COVID=negative. Pt has been self quarantining at home since then. Have you experienced the following symptoms:  Cough yes/no: No Fever (>100.56F)  yes/no: No Runny nose yes/no: No Sore throat yes/no: No Difficulty breathing/shortness of breath  yes/no: No Loss of smell or taste-No Have you or a family member traveled in the last 14 days and where? yes/no: No   . PCP - Dr. Genelle Bal ([place])  Cardiologist - denies  Neurology-Saima Athar  Psychiatrist-Rupinder Kaur  Chest x-ray - NA  EKG - Records requested from PCP, done last week  Stress Test - denies  ECHO - denies  Cardiac Cath - denies  AICD-denies PM-denies LOOP-denies  Sleep Study - 06-07-14. Pt instructed to bring CPAP with mask and tubing on DOS CPAP - Y  LABS-CBC,CMP,T/S  ASA-81mg . Pt instructed to contact Surgeon's office to determine if she needs to continue taking Aspirin up until day of surgery  ERAS-Y.   HA1C-records requested. Fasting Blood Sugar - 129. Lowest-129, High-198 Checks Blood Sugar ___1__ times a day  Anesthesia-Y. To review old requested EKG, records.  Pt denies having chest pain, sob, or fever at this time. All instructions explained to the pt, with a verbal understanding of the material. Pt agrees to go over the instructions while at home for a better understanding. The opportunity to ask questions was provided.

## 2019-02-16 NOTE — Anesthesia Preprocedure Evaluation (Addendum)
Anesthesia Evaluation  Patient identified by MRN, date of birth, ID band Patient awake    Reviewed: Allergy & Precautions, NPO status , Patient's Chart, lab work & pertinent test results  History of Anesthesia Complications (+) PONV  Airway Mallampati: II  TM Distance: >3 FB Neck ROM: Full    Dental no notable dental hx.    Pulmonary asthma , sleep apnea and Continuous Positive Airway Pressure Ventilation , former smoker,    Pulmonary exam normal        Cardiovascular hypertension, Pt. on medications Normal cardiovascular exam     Neuro/Psych  Headaches, Anxiety Depression    GI/Hepatic Neg liver ROS, GERD  Controlled,  Endo/Other  diabetes, Type 2, Oral Hypoglycemic AgentsMorbid obesity  Renal/GU negative Renal ROS     Musculoskeletal negative musculoskeletal ROS (+)   Abdominal   Peds  Hematology negative hematology ROS (+)   Anesthesia Other Findings Day of surgery medications reviewed with the patient.  Reproductive/Obstetrics                           Anesthesia Physical Anesthesia Plan  ASA: III  Anesthesia Plan: General   Post-op Pain Management:    Induction: Intravenous  PONV Risk Score and Plan: 4 or greater and Treatment may vary due to age or medical condition, Ondansetron, Dexamethasone, Midazolam and Scopolamine patch - Pre-op  Airway Management Planned: Oral ETT  Additional Equipment: None  Intra-op Plan:   Post-operative Plan: Extubation in OR  Informed Consent: I have reviewed the patients History and Physical, chart, labs and discussed the procedure including the risks, benefits and alternatives for the proposed anesthesia with the patient or authorized representative who has indicated his/her understanding and acceptance.     Dental advisory given  Plan Discussed with: CRNA  Anesthesia Plan Comments: (PAT note written 02/16/2019 by Myra Gianotti,  PA-C. )      Anesthesia Quick Evaluation

## 2019-02-16 NOTE — Progress Notes (Signed)
Anesthesia Chart Review:  Case: [id number] Date/Time: 02/17/19 1411   Procedure: DILATATION & CURETTAGE/HYSTEROSCOPY WITH MYOSURE (N/A )   Anesthesia type: Choice   Pre-op diagnosis:      R93.89 Thickened endometrium     N84.0 Uterine polyp   Location: [facility] OR ROOM 07 / [facility]   Surgeon: Janyth Pupa, DO      DISCUSSION: Patient is a 48 year old female scheduled for the above procedure.  History includes former smoker (quit 2008), post-operative N/V, HTN, PCOS, Rosacea, GERD, OSA (CPAP), asthma, IBS, DM2, migraines, morbid obesity. She was hospitalized with recurrent incisional hernia with incarceration and small bowel obstruction, managed non-surgically 09/2018 and advised out-patient follow-up with general surgery.   A1c 6.3% on 02/10/19 ([place]). She was seen by her PCP Kristen Loader, FNP at that visit with EKG and labs and cleared for the above procedure.  If no acute changes then I anticipate that she can proceed as planned.   VS: BP 124/81   Pulse 76   Temp 36.4 C   Resp 20   Ht 5\' 11"  (1.803 m)   Wt (!) 178.5 kg   SpO2 99%   BMI 54.90 kg/m   PROVIDERS: Kristen Loader, FNP is PCP   LABS: Labs reviewed: Acceptable for surgery. (all labs ordered are listed, but only abnormal results are displayed)  Labs Reviewed  GLUCOSE, CAPILLARY - Abnormal; Notable for the following components:      Result Value   Glucose-Capillary 129 (*)    All other components within normal limits  COMPREHENSIVE METABOLIC PANEL - Abnormal; Notable for the following components:   CO2 20 (*)    Glucose, Bld 125 (*)    All other components within normal limits  CBC  TYPE AND SCREEN  ABO/RH    EKG: 02/10/19 [facility]): SR. Low voltage in precordial leads. RSR prime pattern in V1.   CV: N/A   Past Medical History:  Diagnosis Date  . Anxiety   . Asthma   . Complication of anesthesia   . Depression   . Diabetes mellitus without complication ([place])   . GERD  (gastroesophageal reflux disease)   . Hernia   . Hypertension   . IBS (irritable bowel syndrome)   . Migraine   . Morbid obesity ([place])   . Polycystic ovarian disease   . PONV (postoperative nausea and vomiting)   . Rosacea   . Sleep apnea     Past Surgical History:  Procedure Laterality Date  . ADENOIDECTOMY    . BACK SURGERY    . HERNIA REPAIR    . TONSILECTOMY, ADENOIDECTOMY, BILATERAL MYRINGOTOMY AND TUBES    . UMBILICAL HERNIA REPAIR    . ventrical hernia repair      MEDICATIONS: . acetaminophen (TYLENOL) 500 MG tablet  . albuterol (VENTOLIN HFA) 108 (90 Base) MCG/ACT inhaler  . ALPRAZolam (XANAX) 0.5 MG tablet  . aspirin EC 81 MG tablet  . Cholecalciferol (VITAMIN D-3) 125 MCG (5000 UT) TABS  . diphenhydrAMINE (BENADRYL) 25 MG tablet  . DULoxetine (CYMBALTA) 60 MG capsule  . escitalopram (LEXAPRO) 10 MG tablet  . famotidine (PEPCID) 20 MG tablet  . hydrochlorothiazide (HYDRODIURIL) 25 MG tablet  . hyoscyamine (LEVSIN SL) 0.125 MG SL tablet  . metFORMIN (GLUCOPHAGE-XR) 500 MG 24 hr tablet  . montelukast (SINGULAIR) 10 MG tablet  . Multiple Vitamin (MULTIVITAMIN WITH MINERALS) TABS tablet  . Omega-3 Fatty Acids (FISH OIL) 1000 MG CAPS  . spironolactone (ALDACTONE) 50 MG  tablet  . Trolamine Salicylate (ASPERCREME EX)  . vitamin B-12 (CYANOCOBALAMIN) 1000 MCG tablet   No current facility-administered medications for this encounter.   PAT RN advised patient to follow-up with surgeon regarding perioperative ASA instructions. I also left a voice message with Myrene (surgical scheduler) on 02/16/19.   Shonna ChockAllison Lamika Connolly, PA-C Surgical Short Stay/Anesthesiology [facility] Phone [phone] [facility] Phone [phone] 02/16/2019 1:01 PM

## 2019-02-17 ENCOUNTER — Ambulatory Visit (HOSPITAL_COMMUNITY): Payer: 59 | Admitting: Anesthesiology

## 2019-02-17 ENCOUNTER — Ambulatory Visit (HOSPITAL_COMMUNITY): Payer: 59 | Admitting: Vascular Surgery

## 2019-02-17 ENCOUNTER — Encounter (HOSPITAL_COMMUNITY)
Admission: RE | Disposition: A | Discharge status: Home / Self Care | Payer: Self-pay | Source: Home / Self Care | Attending: Obstetrics & Gynecology

## 2019-02-17 ENCOUNTER — Other Ambulatory Visit: Payer: Self-pay

## 2019-02-17 ENCOUNTER — Ambulatory Visit (HOSPITAL_COMMUNITY)
Admission: RE | Admit: 2019-02-17 | Discharge: 2019-02-17 | Disposition: A | Discharge status: Home / Self Care | Payer: 59 | Attending: Obstetrics & Gynecology | Admitting: Obstetrics & Gynecology

## 2019-02-17 ENCOUNTER — Encounter (HOSPITAL_COMMUNITY): Payer: Self-pay

## 2019-02-17 DIAGNOSIS — Z8249 Family history of ischemic heart disease and other diseases of the circulatory system: Secondary | ICD-10-CM | POA: Insufficient documentation

## 2019-02-17 DIAGNOSIS — R7303 Prediabetes: Secondary | ICD-10-CM | POA: Insufficient documentation

## 2019-02-17 DIAGNOSIS — R51 Headache: Secondary | ICD-10-CM | POA: Insufficient documentation

## 2019-02-17 DIAGNOSIS — F419 Anxiety disorder, unspecified: Secondary | ICD-10-CM | POA: Insufficient documentation

## 2019-02-17 DIAGNOSIS — Z833 Family history of diabetes mellitus: Secondary | ICD-10-CM | POA: Insufficient documentation

## 2019-02-17 DIAGNOSIS — F329 Major depressive disorder, single episode, unspecified: Secondary | ICD-10-CM | POA: Insufficient documentation

## 2019-02-17 DIAGNOSIS — Z881 Allergy status to other antibiotic agents status: Secondary | ICD-10-CM | POA: Insufficient documentation

## 2019-02-17 DIAGNOSIS — G4733 Obstructive sleep apnea (adult) (pediatric): Secondary | ICD-10-CM | POA: Insufficient documentation

## 2019-02-17 DIAGNOSIS — Z91018 Allergy to other foods: Secondary | ICD-10-CM | POA: Insufficient documentation

## 2019-02-17 DIAGNOSIS — K219 Gastro-esophageal reflux disease without esophagitis: Secondary | ICD-10-CM | POA: Insufficient documentation

## 2019-02-17 DIAGNOSIS — N840 Polyp of corpus uteri: Secondary | ICD-10-CM | POA: Diagnosis not present

## 2019-02-17 DIAGNOSIS — J4520 Mild intermittent asthma, uncomplicated: Secondary | ICD-10-CM | POA: Insufficient documentation

## 2019-02-17 DIAGNOSIS — R9389 Abnormal findings on diagnostic imaging of other specified body structures: Secondary | ICD-10-CM | POA: Insufficient documentation

## 2019-02-17 DIAGNOSIS — Z8049 Family history of malignant neoplasm of other genital organs: Secondary | ICD-10-CM | POA: Insufficient documentation

## 2019-02-17 DIAGNOSIS — I10 Essential (primary) hypertension: Secondary | ICD-10-CM | POA: Insufficient documentation

## 2019-02-17 DIAGNOSIS — Z6841 Body Mass Index (BMI) 40.0 and over, adult: Secondary | ICD-10-CM | POA: Insufficient documentation

## 2019-02-17 DIAGNOSIS — N854 Malposition of uterus: Secondary | ICD-10-CM | POA: Insufficient documentation

## 2019-02-17 DIAGNOSIS — Z8489 Family history of other specified conditions: Secondary | ICD-10-CM | POA: Insufficient documentation

## 2019-02-17 HISTORY — PX: DILATATION & CURETTAGE/HYSTEROSCOPY WITH MYOSURE: SHX6511

## 2019-02-17 LAB — GLUCOSE, CAPILLARY
Glucose-Capillary: 116 mg/dL — ABNORMAL HIGH (ref 70–99)
Glucose-Capillary: 102 mg/dL — ABNORMAL HIGH (ref 70–99)

## 2019-02-17 SURGERY — DILATATION & CURETTAGE/HYSTEROSCOPY WITH MYOSURE
Anesthesia: General | Site: Vagina

## 2019-02-17 MED ORDER — SODIUM CHLORIDE 0.9 % IR SOLN
Status: DC | PRN
  Administered 2019-02-17: 3000 mL

## 2019-02-17 MED ORDER — FENTANYL CITRATE (PF) 250 MCG/5ML IJ SOLN
INTRAMUSCULAR | Status: AC
  Filled 2019-02-17: qty 5

## 2019-02-17 MED ORDER — PROPOFOL 10 MG/ML IV BOLUS
INTRAVENOUS | Status: DC | PRN
  Administered 2019-02-17: 100 mg via INTRAVENOUS
  Administered 2019-02-17: 200 mg via INTRAVENOUS

## 2019-02-17 MED ORDER — DEXAMETHASONE SODIUM PHOSPHATE 10 MG/ML IJ SOLN
INTRAMUSCULAR | Status: DC | PRN
  Administered 2019-02-17: 5 mg via INTRAVENOUS

## 2019-02-17 MED ORDER — LIDOCAINE-EPINEPHRINE 1 %-1:100000 IJ SOLN
INTRAMUSCULAR | Status: AC
  Filled 2019-02-17: qty 1

## 2019-02-17 MED ORDER — LIDOCAINE-EPINEPHRINE 1 %-1:100000 IJ SOLN
INTRAMUSCULAR | Status: DC | PRN
  Administered 2019-02-17: 10 mL

## 2019-02-17 MED ORDER — ONDANSETRON HCL 4 MG/2ML IJ SOLN
INTRAMUSCULAR | Status: DC | PRN
  Administered 2019-02-17: 4 mg via INTRAVENOUS

## 2019-02-17 MED ORDER — MIDAZOLAM HCL 2 MG/2ML IJ SOLN
INTRAMUSCULAR | Status: AC
  Filled 2019-02-17: qty 2

## 2019-02-17 MED ORDER — LACTATED RINGERS IV SOLN
INTRAVENOUS | Status: DC
  Administered 2019-02-17: 13:00:00 via INTRAVENOUS

## 2019-02-17 MED ORDER — SUCCINYLCHOLINE CHLORIDE 20 MG/ML IJ SOLN
INTRAMUSCULAR | Status: DC | PRN
  Administered 2019-02-17: 200 mg via INTRAVENOUS

## 2019-02-17 MED ORDER — MIDAZOLAM HCL 5 MG/5ML IJ SOLN
INTRAMUSCULAR | Status: DC | PRN
  Administered 2019-02-17: 2 mg via INTRAVENOUS

## 2019-02-17 MED ORDER — PROPOFOL 10 MG/ML IV BOLUS
INTRAVENOUS | Status: AC
  Filled 2019-02-17: qty 40

## 2019-02-17 MED ORDER — LIDOCAINE 2% (20 MG/ML) 5 ML SYRINGE
INTRAMUSCULAR | Status: DC | PRN
  Administered 2019-02-17: 100 mg via INTRAVENOUS

## 2019-02-17 MED ORDER — FENTANYL CITRATE (PF) 100 MCG/2ML IJ SOLN
INTRAMUSCULAR | Status: DC | PRN
  Administered 2019-02-17: 50 ug via INTRAVENOUS
  Administered 2019-02-17: 100 ug via INTRAVENOUS

## 2019-02-17 SURGICAL SUPPLY — 14 items
CATH ROBINSON RED A/P 16FR (CATHETERS) ×1 IMPLANT
DEVICE MYOSURE LITE (MISCELLANEOUS) ×1 IMPLANT
DEVICE MYOSURE REACH (MISCELLANEOUS) IMPLANT
DILATOR CANAL MILEX (MISCELLANEOUS) IMPLANT
GLOVE BIOGEL PI IND STRL 7.0 (GLOVE) ×2 IMPLANT
GLOVE BIOGEL PI INDICATOR 7.0 (GLOVE) ×2
GLOVE ECLIPSE 6.5 STRL STRAW (GLOVE) ×2 IMPLANT
GOWN STRL REUS W/ TWL LRG LVL3 (GOWN DISPOSABLE) ×2 IMPLANT
GOWN STRL REUS W/TWL LRG LVL3 (GOWN DISPOSABLE) ×4
KIT PROCEDURE FLUENT (KITS) ×2 IMPLANT
PACK VAGINAL MINOR WOMEN LF (CUSTOM PROCEDURE TRAY) ×2 IMPLANT
PAD OB MATERNITY 4.3X12.25 (PERSONAL CARE ITEMS) ×2 IMPLANT
SEAL ROD LENS SCOPE MYOSURE (ABLATOR) ×2 IMPLANT
TOWEL OR 17X24 6PK STRL BLUE (TOWEL DISPOSABLE) ×4 IMPLANT

## 2019-02-17 NOTE — Transfer of Care (Signed)
Immediate Anesthesia Transfer of Care Note  Patient: Kelly Vargas  Procedure(s) Performed: DILATATION & CURETTAGE/HYSTEROSCOPY WITH MYOSURE (N/A Vagina )  Patient Location: PACU  Anesthesia Type:General  Level of Consciousness: awake, alert  and oriented  Airway & Oxygen Therapy: Patient Spontanous Breathing and Patient connected to face mask oxygen  Post-op Assessment: Report given to RN and Post -op Vital signs reviewed and stable  Post vital signs: Reviewed and stable  Last Vitals:  Vitals Value Taken Time  BP 127/82 02/17/19 1557  Temp 36.5 C 02/17/19 1557  Pulse 98 02/17/19 1557  Resp 17 02/17/19 1552  SpO2 95 % 02/17/19 1557  Vitals shown include unvalidated device data.  Last Pain:  Vitals:   02/17/19 1545  TempSrc:   PainSc: 0-No pain      Patients Stated Pain Goal: 2 (59/16/38 4665)  Complications: No apparent anesthesia complications

## 2019-02-17 NOTE — Anesthesia Procedure Notes (Signed)
Procedure Name: Intubation Date/Time: 02/17/2019 2:20 PM Performed by: Trinna Post., CRNA Pre-anesthesia Checklist: Patient identified, Emergency Drugs available, Suction available, Patient being monitored and Timeout performed Patient Re-evaluated:Patient Re-evaluated prior to induction Oxygen Delivery Method: Circle system utilized Preoxygenation: Pre-oxygenation with 100% oxygen Induction Type: IV induction, Rapid sequence and Cricoid Pressure applied Laryngoscope Size: Glidescope and 3 Grade View: Grade I Tube type: Oral Tube size: 7.0 mm Number of attempts: 1 Airway Equipment and Method: Video-laryngoscopy and Rigid stylet Placement Confirmation: ETT inserted through vocal cords under direct vision,  positive ETCO2 and breath sounds checked- equal and bilateral Secured at: 23 cm Tube secured with: Tape Dental Injury: Teeth and Oropharynx as per pre-operative assessment

## 2019-02-17 NOTE — Op Note (Signed)
Operative Report  PreOp: thickened endometrium, morbid obesity PostOp: same Procedure:  Hysteroscopy, Dilation and Curettage, myosure resection Surgeon: Dr. Janyth Pupa Anesthesia: General, cervical block Complications:none EBL: 5cc Discrepancy: 410cc  Findings:7cm retroverted uterus, ostia visualized, proliferative endometrium noted  Specimens: 1) endometrial curettings  Procedure: The patient was taken to the operating room where she underwent general anesthesia without difficulty. The patient was placed in a low lithotomy position using Allen stirrups. She was then prepped and draped in the normal sterile fashion.A sterile speculum was inserted into the vagina. 10cc of 1% lidocaine with epi was used for a cervical block.  A single tooth tenaculum was placed on the anterior lip of the cervix. The uterus was then sounded to 7cm. The endocervical canal was then serially dilated to 14 [place] using Hank dilators.  The diagnostic hysteroscope was then inserted without difficulty and noted to have the findings as listed above. Visualization was achieved using NS as a distending medium.  The myosure was used for resection of the proliferative endometrium, no uterine perforations were seen.  The hysteroscope was removed and sharp curettage was performed. The tissue was sent to pathology. The hysteroscope was reinserted- no perforation seen. All instrument were removed. Hemostasis was observed at the cervical site. The patient was repositioned to the supine position. The patient tolerated the procedure without any complications and taken to recovery in stable condition.   Janyth Pupa, DO [phone] (pager) [phone] (office)

## 2019-02-17 NOTE — Interval H&P Note (Signed)
History and Physical Interval Note:  02/17/2019 1:40 PM  [name]  has presented today for surgery, with the diagnosis of R93.89 Thickened endometrium N84.0 Uterine polyp.  The various methods of treatment have been discussed with the patient and family. After consideration of risks, benefits and other options for treatment, the patient has consented to  Procedure(s): Kelly Vargas (N/A) as a surgical intervention.  The patient's history has been reviewed, patient examined, no change in status, stable for surgery.  I have reviewed the patient's chart and labs.  Questions were answered to the patient's satisfaction.     [name]

## 2019-02-18 ENCOUNTER — Encounter (HOSPITAL_COMMUNITY): Payer: Self-pay | Admitting: Obstetrics & Gynecology

## 2019-02-18 NOTE — Anesthesia Postprocedure Evaluation (Signed)
Anesthesia Post Note  Patient: Kelly Vargas  Procedure(s) Performed: DILATATION & CURETTAGE/HYSTEROSCOPY WITH MYOSURE (N/A Vagina )     Patient location during evaluation: PACU Anesthesia Type: General Level of consciousness: awake and alert Pain management: pain level controlled Vital Signs Assessment: post-procedure vital signs reviewed and stable Respiratory status: spontaneous breathing, nonlabored ventilation and respiratory function stable Cardiovascular status: blood pressure returned to baseline and stable Postop Assessment: no apparent nausea or vomiting Anesthetic complications: no    Last Vitals:  Vitals:   02/17/19 1551 02/17/19 1557  BP:  127/82  Pulse: 88 98  Resp: 15   Temp:  36.5 C  SpO2: 95% 95%    Last Pain:  Vitals:   02/17/19 1545  TempSrc:   PainSc: 0-No pain   Pain Goal: Patients Stated Pain Goal: 2 (02/17/19 1243)                 Brennan Bailey

## 2019-03-16 ENCOUNTER — Emergency Department (HOSPITAL_BASED_OUTPATIENT_CLINIC_OR_DEPARTMENT_OTHER)
Admission: EM | Admit: 2019-03-16 | Discharge: 2019-03-16 | Disposition: A | Discharge status: Home / Self Care | Payer: 59 | Attending: Emergency Medicine | Admitting: Emergency Medicine

## 2019-03-16 ENCOUNTER — Other Ambulatory Visit: Payer: Self-pay

## 2019-03-16 ENCOUNTER — Emergency Department (HOSPITAL_BASED_OUTPATIENT_CLINIC_OR_DEPARTMENT_OTHER): Payer: 59

## 2019-03-16 ENCOUNTER — Encounter (HOSPITAL_BASED_OUTPATIENT_CLINIC_OR_DEPARTMENT_OTHER): Payer: Self-pay | Admitting: *Deleted

## 2019-03-16 DIAGNOSIS — Z79899 Other long term (current) drug therapy: Secondary | ICD-10-CM | POA: Insufficient documentation

## 2019-03-16 DIAGNOSIS — R1110 Vomiting, unspecified: Secondary | ICD-10-CM | POA: Insufficient documentation

## 2019-03-16 DIAGNOSIS — E119 Type 2 diabetes mellitus without complications: Secondary | ICD-10-CM | POA: Diagnosis not present

## 2019-03-16 DIAGNOSIS — Z87891 Personal history of nicotine dependence: Secondary | ICD-10-CM | POA: Insufficient documentation

## 2019-03-16 DIAGNOSIS — Z7982 Long term (current) use of aspirin: Secondary | ICD-10-CM | POA: Insufficient documentation

## 2019-03-16 DIAGNOSIS — R109 Unspecified abdominal pain: Secondary | ICD-10-CM | POA: Diagnosis present

## 2019-03-16 DIAGNOSIS — I10 Essential (primary) hypertension: Secondary | ICD-10-CM | POA: Insufficient documentation

## 2019-03-16 DIAGNOSIS — K439 Ventral hernia without obstruction or gangrene: Secondary | ICD-10-CM | POA: Insufficient documentation

## 2019-03-16 DIAGNOSIS — Z7984 Long term (current) use of oral hypoglycemic drugs: Secondary | ICD-10-CM | POA: Insufficient documentation

## 2019-03-16 LAB — CBC WITH DIFFERENTIAL/PLATELET
Abs Immature Granulocytes: 0.04 10*3/uL (ref 0.00–0.07)
Basophils Absolute: 0.1 10*3/uL (ref 0.0–0.1)
Basophils Relative: 1 %
Eosinophils Absolute: 0.3 10*3/uL (ref 0.0–0.5)
Eosinophils Relative: 2 %
HCT: 46.1 % — ABNORMAL HIGH (ref 36.0–46.0)
Hemoglobin: 15 g/dL (ref 12.0–15.0)
Immature Granulocytes: 0 %
Lymphocytes Relative: 20 %
Lymphs Abs: 2.4 10*3/uL (ref 0.7–4.0)
MCH: 29.4 pg (ref 26.0–34.0)
MCHC: 32.5 g/dL (ref 30.0–36.0)
MCV: 90.4 fL (ref 80.0–100.0)
Monocytes Absolute: 0.8 10*3/uL (ref 0.1–1.0)
Monocytes Relative: 6 %
Neutro Abs: 8.6 10*3/uL — ABNORMAL HIGH (ref 1.7–7.7)
Neutrophils Relative %: 71 %
Platelets: 256 10*3/uL (ref 150–400)
RBC: 5.1 MIL/uL (ref 3.87–5.11)
RDW: 13.1 % (ref 11.5–15.5)
WBC: 12.2 10*3/uL — ABNORMAL HIGH (ref 4.0–10.5)
nRBC: 0 % (ref 0.0–0.2)

## 2019-03-16 LAB — COMPREHENSIVE METABOLIC PANEL
ALT: 28 U/L (ref 0–44)
AST: 30 U/L (ref 15–41)
Albumin: 4.5 g/dL (ref 3.5–5.0)
Alkaline Phosphatase: 48 U/L (ref 38–126)
Anion gap: 14 (ref 5–15)
BUN: 13 mg/dL (ref 6–20)
CO2: 23 mmol/L (ref 22–32)
Calcium: 9.2 mg/dL (ref 8.9–10.3)
Chloride: 99 mmol/L (ref 98–111)
Creatinine, Ser: 0.75 mg/dL (ref 0.44–1.00)
GFR calc Af Amer: >60 mL/min (ref >60)
GFR calc non Af Amer: >60 mL/min (ref >60)
Glucose, Bld: 132 mg/dL — ABNORMAL HIGH (ref 70–99)
Potassium: 3.8 mmol/L (ref 3.5–5.1)
Sodium: 136 mmol/L (ref 135–145)
Total Bilirubin: 0.3 mg/dL (ref 0.3–1.2)
Total Protein: 7.5 g/dL (ref 6.5–8.1)

## 2019-03-16 LAB — LIPASE, BLOOD: Lipase: 32 U/L (ref 11–51)

## 2019-03-16 MED ORDER — SODIUM CHLORIDE 0.9 % IV BOLUS
1000.0000 mL | Freq: Once | INTRAVENOUS | Status: AC
  Administered 2019-03-16: 1000 mL via INTRAVENOUS

## 2019-03-16 MED ORDER — SODIUM CHLORIDE 0.9 % IV SOLN: INTRAVENOUS | Status: DC

## 2019-03-16 MED ORDER — IOHEXOL 300 MG/ML  SOLN
100.0000 mL | Freq: Once | INTRAMUSCULAR | Status: AC | PRN
  Administered 2019-03-16: 100 mL via INTRAVENOUS

## 2019-03-16 NOTE — ED Notes (Signed)
Patient transported to CT 

## 2019-03-16 NOTE — ED Provider Notes (Signed)
[facility] EMERGENCY DEPARTMENT Provider Note   CSN: [id number] Arrival date & time: 03/16/19  1900     History   Chief Complaint Chief Complaint  Patient presents with  . Abdominal Pain  . Emesis    HPI Franki MonteKristy M Lamadrid is a 48 y.o. female.     Patient was admitted in January for small bowel obstruction and due to an umbilical and a large ventral hernia.  The small bowel obstruction resolved without surgical intervention.  Patient states that and on Sunday she started with some belly pain.  She had nausea and vomiting at 1300 today.  Crampy pain.  She was concerned that she was getting a bowel obstruction again.  Plus her large ventral hernia felt firm and normally she can reduce it and was not able to get it reduced.  But when patient was here laying flat patient feels that it reduced.  She had bowel sounds back and she feels much more comfortable.     Past Medical History:  Diagnosis Date  . Anxiety   . Asthma   . Complication of anesthesia   . Depression   . Diabetes mellitus without complication (HCC)   . GERD (gastroesophageal reflux disease)   . Hernia   . Hypertension   . IBS (irritable bowel syndrome)   . Migraine   . Morbid obesity (HCC)   . Polycystic ovarian disease   . PONV (postoperative nausea and vomiting)   . Rosacea   . Sleep apnea     Patient Active Problem List   Diagnosis Date Noted  . Generalized abdominal pain   . Type 2 diabetes mellitus without complication, without long-term current use of insulin (HCC)   . Depression   . Anxiety   . SBO (small bowel obstruction) (HCC) 09/09/2018  . Recurrent incisional hernia with incarceration 09/09/2018  . PCOS (polycystic ovarian syndrome) 03/02/2015  . Morbid obesity with BMI of 50.0-59.9, adult (HCC) 03/02/2015  . Hirsutism 03/02/2015  . High blood pressure associated with diabetes (HCC) 03/02/2015    Past Surgical History:  Procedure Laterality Date  . ADENOIDECTOMY    . BACK  SURGERY    . DILATATION & CURETTAGE/HYSTEROSCOPY WITH MYOSURE N/A 02/17/2019   Procedure: DILATATION & CURETTAGE/HYSTEROSCOPY WITH MYOSURE;  Surgeon: Myna Hidalgozan, Jennifer, DO;  Location: [facility] OR;  Service: Gynecology;  Laterality: N/A;  . HERNIA REPAIR    . TONSILECTOMY, ADENOIDECTOMY, BILATERAL MYRINGOTOMY AND TUBES    . UMBILICAL HERNIA REPAIR    . ventrical hernia repair       OB History   No obstetric history on file.      Home Medications    Prior to Admission medications   Medication Sig Start Date End Date Taking? Authorizing Provider  acetaminophen (TYLENOL) 500 MG tablet Take 500 mg by mouth every 6 (six) hours as needed for mild pain.     [provider]  albuterol (VENTOLIN HFA) 108 (90 Base) MCG/ACT inhaler Inhale 1-2 puffs into the lungs every 6 (six) hours as needed for wheezing or shortness of breath.    [provider]  ALPRAZolam Prudy Feeler(XANAX) 0.5 MG tablet Take 0.5 mg by mouth 3 (three) times daily as needed for anxiety.     [provider]  aspirin EC 81 MG tablet Take 81 mg by mouth daily.    [provider]  Cholecalciferol (VITAMIN D-3) 125 MCG (5000 UT) TABS Take 5,000 Units by mouth daily.    [provider]  diphenhydrAMINE (BENADRYL) 25  MG tablet Take 25 mg by mouth at bedtime as needed for sleep (sleep or rash).    [provider]  DULoxetine (CYMBALTA) 60 MG capsule Take 60 mg by mouth daily.    [provider]  escitalopram (LEXAPRO) 10 MG tablet Take 10 mg by mouth daily.    [provider]  famotidine (PEPCID) 20 MG tablet Take 20 mg by mouth every evening.     [provider]  hydrochlorothiazide (HYDRODIURIL) 25 MG tablet Take 25 mg by mouth daily.    [provider]  hyoscyamine (LEVSIN SL) 0.125 MG SL tablet Place 0.125 mg under the tongue every 4 (four) hours as needed (IBS).    [provider]  metFORMIN (GLUCOPHAGE-XR) 500 MG 24 hr tablet Take 500 mg by mouth 2 (two)  times a day.    [provider]  montelukast (SINGULAIR) 10 MG tablet Take 10 mg by mouth daily.    [provider]  Multiple Vitamin (MULTIVITAMIN WITH MINERALS) TABS tablet Take 1 tablet by mouth daily. Women's One-A-Day Multivitamin    [provider]  Omega-3 Fatty Acids (FISH OIL) 1000 MG CAPS Take 1,000 mg by mouth daily.    [provider]  spironolactone (ALDACTONE) 50 MG tablet TAKE 1 TABLET BY MOUTH  DAILY. TAKE WITH BREAKFAST. Patient taking differently: Take 50 mg by mouth daily.  07/04/17   Orvilla Cornwallenney, Rachelle A, CNM  Trolamine Salicylate (ASPERCREME EX) Apply 1 application topically 4 (four) times daily as needed (pain.).    [provider]  vitamin B-12 (CYANOCOBALAMIN) 1000 MCG tablet Take 1,000 mcg by mouth daily.    [provider]    Family History Family History  Problem Relation Age of Onset  . Heart disease Mother   . Cancer Mother   . Stroke Mother   . Diabetes Mother   . Hypertension Mother   . Heart disease Father   . Hypertension Brother   . High Cholesterol Brother   . Depression Brother     Social History Social History   Tobacco Use  . Smoking status: Former Smoker    Packs/day: 1.00    Years: 20.00    Pack years: 20.00    Types: Cigarettes    Quit date: 09/02/2006    Years since quitting: 12.5  . Smokeless tobacco: Never Used  Substance Use Topics  . Alcohol use: Yes    Alcohol/week: 0.0 standard drinks    Comment: occasionally  . Drug use: No     Allergies   Chamomile, Levomenol, Penicillins, Erythromycin, and Morphine   Review of Systems Review of Systems  Constitutional: Negative for chills and fever.  HENT: Negative for congestion, rhinorrhea and sore throat.   Eyes: Negative for visual disturbance.  Respiratory: Negative for cough and shortness of breath.   Cardiovascular: Negative for chest pain and leg swelling.  Gastrointestinal: Positive for abdominal pain, nausea and  vomiting. Negative for diarrhea.  Genitourinary: Negative for dysuria.  Musculoskeletal: Negative for back pain and neck pain.  Skin: Negative for rash.  Neurological: Negative for dizziness, light-headedness and headaches.  Hematological: Does not bruise/bleed easily.  Psychiatric/Behavioral: Negative for confusion.     Physical Exam Updated Vital Signs BP 120/72 (BP Location: Right Arm)   Pulse 77   Temp 98.2 F (36.8 C) (Oral)   Resp 16   Ht 1.829 m (6')   Wt (!) 178.7 kg   LMP 01/12/2016   SpO2 98%   BMI 53.44 kg/m  Physical Exam Vitals signs and nursing note reviewed.  Constitutional:      General: She is not in acute distress.    Appearance: Normal appearance. She is well-developed.  HENT:     Head: Normocephalic and atraumatic.  Eyes:     Extraocular Movements: Extraocular movements intact.     Conjunctiva/sclera: Conjunctivae normal.     Pupils: Pupils are equal, round, and reactive to light.  Neck:     Musculoskeletal: Normal range of motion and neck supple.  Cardiovascular:     Rate and Rhythm: Normal rate and regular rhythm.     Heart sounds: No murmur.  Pulmonary:     Effort: Pulmonary effort is normal. No respiratory distress.     Breath sounds: Normal breath sounds.  Abdominal:     Palpations: Abdomen is soft.     Tenderness: There is no abdominal tenderness. There is no guarding.     Hernia: A hernia is present.     Comments: Patient with large ventral hernia.  It is reducible.  Nontender.  Abdomen is nontender in general.  Musculoskeletal: Normal range of motion.  Skin:    General: Skin is warm and dry.  Neurological:     General: No focal deficit present.     Mental Status: She is alert and oriented to person, place, and time.      ED Treatments / Results  Labs (all labs ordered are listed, but only abnormal results are displayed) Labs Reviewed  COMPREHENSIVE METABOLIC PANEL - Abnormal; Notable for the following components:      Result  Value   Glucose, Bld 132 (*)    All other components within normal limits  CBC WITH DIFFERENTIAL/PLATELET - Abnormal; Notable for the following components:   WBC 12.2 (*)    HCT 46.1 (*)    Neutro Abs 8.6 (*)    All other components within normal limits  LIPASE, BLOOD    EKG None  Radiology Ct Abdomen Pelvis W Contrast  Result Date: 03/16/2019 CLINICAL DATA:  48 year old female with abdominal pain since yesterday. Vomiting. Ventral abdominal hernia. EXAM: CT ABDOMEN AND PELVIS WITH CONTRAST TECHNIQUE: Multidetector CT imaging of the abdomen and pelvis was performed using the standard protocol following bolus administration of intravenous contrast. CONTRAST:  121mL OMNIPAQUE IOHEXOL 300 MG/ML  SOLN COMPARISON:  CT Abdomen and Pelvis 09/09/2018. FINDINGS: Lower chest: Resolved atelectasis since January, negative lung bases today. Stable mild elevation of the right hemidiaphragm. Hepatobiliary: Fatty liver disease. No discrete liver lesion. Negative gallbladder. Pancreas: Negative. Spleen: Negative. Adrenals/Urinary Tract: Normal adrenal glands. Stable lobulated appearance of both kidneys. Horizontal rotation of the right kidney is stable, normal variant. No acute perinephric stranding. Renal enhancement and delayed contrast excretion is symmetric and within normal limits. The ureters are decompressed and negative. Diminutive and unremarkable urinary bladder. Stomach/Bowel: Decompressed and negative rectum. Redundant sigmoid colon with descending and proximal sigmoid diverticulosis, but no active inflammation. Negative transverse colon. Negative right colon. Appendix not clearly delineated but no pericecal inflammation. Negative terminal ileum. Large bilobed small bowel and mesentery containing ventral abdominal hernia now encompasses 22 x 12 centimeters (transverse by cc). See series 2, image 65 and coronal image 16. The herniated loops appear non incarcerated, and there is no dilated small bowel in  the abdomen. Decompressed stomach and duodenum. No free air, free fluid, or mesenteric stranding. Vascular/Lymphatic: Suboptimal intravascular contrast bolus but the major arterial structures appear patent with mild ectasia. Portal venous system appears to be patent. No lymphadenopathy. Reproductive:  Negative. Other: Trace if any pelvic free fluid. Musculoskeletal: Advanced chronic lower lumbar spine degeneration. No acute osseous abnormality identified. IMPRESSION: 1. Large bilobed small bowel and mesentery containing ventral abdominal hernia appears non-incarcerated but larger than in January, now 22 x 12 cm. 2. No bowel obstruction or inflammatory process identified in the abdomen or pelvis. 3. Fatty liver disease. Diverticulosis of the descending and proximal sigmoid colon. Advanced lower lumbar spine degeneration. Electronically Signed   By: Odessa FlemingH  Hall M.D.   On: 03/16/2019 21:59    Procedures Procedures (including critical care time)  Medications Ordered in ED Medications  0.9 %  sodium chloride infusion (has no administration in time range)  sodium chloride 0.9 % bolus 1,000 mL (0 mLs Intravenous Stopped 03/16/19 2233)  iohexol (OMNIPAQUE) 300 MG/ML solution 100 mL (100 mLs Intravenous Contrast Given 03/16/19 2142)     Initial Impression / Assessment and Plan / ED Course  I have reviewed the triage vital signs and the nursing notes.  Pertinent labs & imaging results that were available during my care of the patient were reviewed by me and considered in my medical decision making (see chart for details).        CT scan shows large ventral hernia.  No evidence of small bowel obstruction.  No evidence of incarcerated hernia.  Clinically the hernia is reducible.  Not particular tender.  Mild leukocytosis.  Probably secondary to ventral hernia incarceration that was occurring over the weekend in the last few days.  Patient feels tremendously better nontoxic.  We will have patient follow back up  with general surgery for consideration for repair of the ventral hernia.  Final Clinical Impressions(s) / ED Diagnoses   Final diagnoses:  Ventral hernia without obstruction or gangrene    ED Discharge Orders    None       Vanetta MuldersZackowski, Alexxander Kurt, MD 03/16/19 2237

## 2019-03-16 NOTE — ED Triage Notes (Signed)
Abdominal pain since yesterday. crampy pain. States she has an abdominal hernia that she normally able to reduce the hernia. She feels like she has a bowel obstruction. Vomiting.

## 2019-03-16 NOTE — Discharge Instructions (Signed)
CT scan shows a large ventral hernia.  No evidence of any bowel obstruction or incarceration of the hernia at this time.  Clinically we kind of thought that.  Labs without significant abnormalities other than a mild increase in your white blood cell count.  I would recommend the following back up with general surgery for consideration of repair of the ventral hernia.  Return for any new or worse symptoms.

## 2019-03-16 NOTE — ED Notes (Signed)
ED Provider at bedside. 

## 2019-08-02 ENCOUNTER — Other Ambulatory Visit: Payer: Self-pay | Admitting: Obstetrics & Gynecology

## 2019-08-02 DIAGNOSIS — Z1231 Encounter for screening mammogram for malignant neoplasm of breast: Secondary | ICD-10-CM

## 2019-08-04 ENCOUNTER — Ambulatory Visit
Admission: RE | Admit: 2019-08-04 | Discharge: 2019-08-04 | Disposition: A | Discharge status: Home / Self Care | Payer: 59 | Source: Ambulatory Visit | Attending: Obstetrics & Gynecology | Admitting: Obstetrics & Gynecology

## 2019-08-04 ENCOUNTER — Other Ambulatory Visit: Payer: Self-pay

## 2019-08-04 DIAGNOSIS — Z1231 Encounter for screening mammogram for malignant neoplasm of breast: Secondary | ICD-10-CM

## 2019-10-28 ENCOUNTER — Encounter: Payer: Self-pay | Admitting: Neurology

## 2019-10-28 ENCOUNTER — Telehealth (INDEPENDENT_AMBULATORY_CARE_PROVIDER_SITE_OTHER): Payer: 59 | Admitting: Neurology

## 2019-10-28 DIAGNOSIS — G4731 Primary central sleep apnea: Secondary | ICD-10-CM | POA: Diagnosis not present

## 2019-10-28 DIAGNOSIS — G4733 Obstructive sleep apnea (adult) (pediatric): Secondary | ICD-10-CM

## 2019-10-28 NOTE — Progress Notes (Signed)
Interim history:  Kelly Vargas is a 49 year old right-handed woman with an underlying medical history of polycystic ovarian syndrome, IBS, hypertension, reflux disease, obesity, depression and anxiety, PTSD, status post ventral hernia and umbilical hernia repairs, lumbar laminectomy, tonsillectomy and adenoidectomy, who presents for a virtual visit through my chart video visit for follow-up consultation of her complex sleep apnea, treated with BiPAP ST. The patient is unaccompanied today and presents via computer from home, I am located in my office. I last saw her on Today, 10/26/2018, at which time she feels fully compliant with her BiPAP.  She had a recent hospitalization for small bowel obstruction.  She had an incarcerated hernia.  She had lost some weight, she needed to lose more weight to pursue hernia surgery.  Today, 10/28/2019: Please also see below for virtual visit documentation.  I reviewed her BiPAP ST compliance data from 09/27/2019 through 08/24/20, which is a total of 29 days, during which time she used her machine every night, with 100% compliance, average usage of 9 hours and 27 minutes, residual AHI at goal at 0.5/h, leak on the higher side with a 95th percentile at 38.2 L/min on a pressure of 14/10 with a rate of 12. The patient's allergies, current medications, family history, past medical history, past social history, past surgical history and problem list were reviewed and updated as appropriate.    Previously:  I saw her on 10/23/2017, at which time she was fully compliant with her BiPAP. Unfortunately, she had a lot of stressors. She was in grad school, she had family members who were battling cancer. She was supposed to establish care with a new GYN and she had been experiencing foot pain. Her A1c had risen.   I reviewed her BiPAP compliance data from 09/22/2018 through 10/21/2018 which is a total of 30 days, during which time she used her machine every night with percent used  days greater than 4 hours at 100%, indicating superb compliance and high average usage of 11 hours and 29 minutes, residual AHI at goal at 0.4 per hour, leak high, albeit better than before, with the 95th percentile of 36.1 L/m on a pressure of 15/11 with a backup rate of 12.   Of note, she missed an appointment on 08/21/2017. I saw her on 08/21/2016, at which time she was fully compliant with her BiPAP ST. She was doing well overall. She was advised to follow-up routinely in one year. I did suggest we reduced her BiPAP pressure because of high leak. I reduced to 15/11 cm with a rate of 12.   I reviewed her BiPAP compliance data from 09/22/2017 through 10/21/2017 which is a total of 30 days, during which time she used her BiPAP every night with percent used days greater than 4 hours at 100%, indicating superb compliance with an average usage of 8 hours and 26 minutes, residual AHI at goal at 0.5 per hour, leak high with the 95th percentile at 50.8 L/m on a pressure of 15/11 cm with a backup rate of 12.    I saw her on 08/22/2015, at which time she was compliant with BiPAP ST. She had more stress secondary to working full time and she was in online school as well. She had started spironolactone 50 mg once daily and an OCP for her PCOS. She had lost a little bit of weight. She was complaining of urinary urgency and nocturia as well as incontinence at times.    08/21/2016: The reviewed her BiPAP compliance  data from 07/20/2016 through 08/18/2016 which is a total of 30 days, during which time she used her BiPAP every night with percent used days greater than 4 hours at 100%, indicating superb compliance with an average usage of 8 hours and 28 minutes, residual AHI low at 0.6 per hour, leak with the 95th percentile at 73 L/m on a pressure of 17/13 with a rate of 12.    I saw her on 02/20/2015, at which time she reported doing fairly well. She was trying to lose weight and had lost about 11 pounds. She was in  the process of starting grad school in July. She was enrolling in the masters program. She was seeking care with a new primary care physician. She was using a small fullface mask. She was compliant with BiPAP treatment. I encouraged her to continue with treatment and to strive for weight loss.   I reviewed her BiPAP compliance data from 07/22/2015 through 08/20/2015 which is a total of 30 days during which time she used her machine every night with percent used days greater than 4 hours at 97%, indicating excellent compliance with an average usage of 8 hours and 29 minutes, residual AHI low at 0.1 per hour, leak consistently high with the 95th percentile of leak at 65.1 L/m on a pressure of 17/13 with a rate of 12.   I saw her on 10/27/2014, at which time she reported doing fairly well. Unfortunately, leak from the mask was still high. Her lower extremity edema was no better. Her blood pressure values were good. Her nocturia was much improved from before treatment. I requested a reduction in her pressure to 19/15 cm.   I reviewed her BiPAP compliance data from 01/18/2015 through 02/16/2015 which is a total of 30 days during which time she used her machine every night, percent used days greater than 4 hours was under percent, indicating superb compliance with an average usage of 8 hours and 34 minutes for all nights, residual AHI low at 0.2 per hour but leak still high, albeit improved with the 95th percentile at 63.2 L/m. Pressure at 19/15 with a rate of 12.   I saw her on 07/26/2014, at which time we talked about her sleep test results and her compliance data. She had obstructive sleep apnea which did not respond completely to CPAP therapy and standard BiPAP therapy and she started having central apneas on standard BiPAP. She was therefore switched to BiPAP ST. She had good compliance and indicated good results. She reported sleeping better and improvement of her nocturia as well as improved daytime  alertness. Leak was high and I suggested we request a mask refit and reduction in BiPAP pressure 19/15 cm but both things were actually not gone. I have emailed her DME provider today. The patient states that she was mailed another fullface mask but this was even worse in terms of fit and her mouth would come open. She is trying to get on track with her weight loss.    I reviewed her BiPAP ST compliance data from 09/24/2014 through 10/23/2014 which is a total of 30 days during which time she was under percent compliant. Average usage of 7 hours and 52 minutes with a pressure of 21/17 and the rate of 12. Residual AHI at 1 per hour and leak at times high with the 95th percentile at 75.6 L/m.   I first met her on 05/13/2014 at the request of her psychiatrist, at which time the patient reported difficulty  with sleep maintenance with frequent nighttime awakenings, snoring, nocturia, and witnessed apneas. I asked her to return for a sleep study. She had a split-night sleep study on 06/07/2014 and went over her test results with her in detail today. Her baseline sleep efficiency was 62.9% with a latency to sleep of 15.5 minutes and wake after sleep onset of 21 minutes with moderate sleep fragmentation noted. She had an increased percentage of stage I sleep, an increased percentage of slow-wave sleep, and absence of REM sleep. No EKG changes were noted and no significant PLMS. She had loud snoring. She slept on her back only. Her total AHI was 61 per hour. Baseline oxygen saturation was only 88%, nadir was 80%. She was therefore started on CPAP therapy. Sleep efficiency was improved. She had an increased percentage of REM sleep at 39.3%. Average oxygen saturation was 89%, nadir was 78%. The patient was started on CPAP. She was first tried on a nasal mask but had significant mouth opening. She was titrated from 5-15 cm. She had significant residual sleep disordered breathing and therefore was switched to BiPAP therapy.  She was titrated from 16/12 cm to 20/16 cm. She had central apneas. She was switched to BiPAP ST. She was titrated to a final pressure of 21/17 with a rate of 12. Residual AHI was 0 on the final setting with her O2 nadir of 89% on the final setting. Based on her test results I tarted her on BiPAP ST at home.   I reviewed her BiPAP compliance data from 06/21/2014 through 07/20/2014 which is a total of 30 days during which time she was under percent compliant. Percent used days greater than 4 hours was 100%, indicating superb compliance. Average usage of 8 hours and 17 minutes. Pressure at 21/17 with a rate of 12. Residual AHI 1 per hour, leak was high.   She works from home for [employer]. She has had sleep walking and sleep eating into adulthood. She has tried Nyquil, Xanax, benadryl. She used to get morning HAs, but these improved since she starting using a custom made bite guard for bruxism. She has had nocturia x 2-3 times, but also nocturnal incontinence. She denies RLS symptoms and is not known to kick in her sleep.   Her typical bedtime is reported to be around MN and usual wake time is around 7:30 AM. Sleep onset typically occurs within minutes after reading on her Kindle. She reports feeling poorly rested upon awakening. She wakes up on an average 3 times in the middle of the night and has to go to the bathroom 2-3 times on a typical night.   She reports excessive daytime somnolence (EDS) and Her Epworth Sleepiness Score (ESS) is 6/24 today. She has not fallen asleep while driving. The patient has been taking a nap during lunch time at times.   She has been known to snore for the past many years. Snoring is reportedly marked, and associated with choking sounds and witnessed apneas. The patient admits to a sense of choking or strangling feeling. There is report of nighttime reflux, and she has to sleep on 2 pillows. She prefers to sleep on the R side. There is no nighttime cough experienced. The patient has  not noted any RLS symptoms and is not known to kick while asleep or before falling asleep. There is family history of insomnia, but no obvious RLS or OSA.   She is a restless sleeper and in the morning, the bed is quite disheveled.  She denies cataplexy, sleep paralysis, hypnagogic or hypnopompic hallucinations, or sleep attacks. She does report any vivid dreams, some nightmares, no dream enactments, but parasomnias, such as sleep eating, and sleep walking. The patient has not had a sleep study or a home sleep test.   She consumes 5-6 caffeinated beverages per day, usually in the form of coffee and sodas, as late as dinner. Her bedroom is usually dark and cool. There is no TV in the bedroom.    Her Past Medical History Is Significant For: Past Medical History:  Diagnosis Date  . Anxiety   . Asthma   . Complication of anesthesia   . Depression   . Diabetes mellitus without complication ([place])   . GERD (gastroesophageal reflux disease)   . Hernia   . Hypertension   . IBS (irritable bowel syndrome)   . Migraine   . Morbid obesity ([place])   . Polycystic ovarian disease   . PONV (postoperative nausea and vomiting)   . Rosacea   . Sleep apnea     Her Past Surgical History Is Significant For: Past Surgical History:  Procedure Laterality Date  . ADENOIDECTOMY    . BACK SURGERY    . DILATATION & CURETTAGE/HYSTEROSCOPY WITH MYOSURE N/A 02/17/2019   Procedure: DILATATION & CURETTAGE/HYSTEROSCOPY WITH MYOSURE;  Surgeon: Janyth Pupa, DO;  Location: [facility];  Service: Gynecology;  Laterality: N/A;  . HERNIA REPAIR    . TONSILECTOMY, ADENOIDECTOMY, BILATERAL MYRINGOTOMY AND TUBES    . UMBILICAL HERNIA REPAIR    . ventrical hernia repair      Her Family History Is Significant For: Family History  Problem Relation Age of Onset  . Heart disease Mother   . Cancer Mother   . Stroke Mother   . Diabetes Mother   . Hypertension Mother   . Heart disease Father   . Hypertension Brother   . High  Cholesterol Brother   . Depression Brother     Her Social History Is Significant For: Social History   Socioeconomic History  . Marital status: Legally Separated    Spouse name: [name]  . Number of children: 0  . Years of education: Asocc.  . Highest education level: Not on file  Occupational History    Employer: [employer]  Tobacco Use  . Smoking status: Former Smoker    Packs/day: 1.00    Years: 20.00    Pack years: 20.00    Types: Cigarettes    Quit date: 09/02/2006    Years since quitting: 13.1  . Smokeless tobacco: Never Used  Substance and Sexual Activity  . Alcohol use: Yes    Alcohol/week: 0.0 standard drinks    Comment: occasionally  . Drug use: No  . Sexual activity: Yes    Partners: Female  Other Topics Concern  . Not on file  Social History Narrative   Patient lives at home with spouse.   Caffeine Use: 5-6 cups of coffee daily   Social Determinants of Health   Financial Resource Strain:   . Difficulty of Paying Living Expenses: Not on file  Food Insecurity:   . Worried About Charity fundraiser in the Last Year: Not on file  . Ran Out of Food in the Last Year: Not on file  Transportation Needs:   . Lack of Transportation (Medical): Not on file  . Lack of Transportation (Non-Medical): Not on file  Physical Activity:   . Days of Exercise per Week: Not on file  . Minutes of Exercise  per Session: Not on file  Stress:   . Feeling of Stress : Not on file  Social Connections:   . Frequency of Communication with Friends and Family: Not on file  . Frequency of Social Gatherings with Friends and Family: Not on file  . Attends Religious Services: Not on file  . Active Member of Clubs or Organizations: Not on file  . Attends Archivist Meetings: Not on file  . Marital Status: Not on file    Her Allergies Are:  Allergies  Allergen Reactions  . Chamomile Swelling    Lips and mouth  . Levomenol Swelling  . Penicillins Hives    Did it  involve swelling of the face/tongue/throat, SOB, or low BP? No Did it involve sudden or severe rash/hives, skin peeling, or any reaction on the inside of your mouth or nose? Yes Did you need to seek medical attention at a hospital or doctor's office? No When did it last happen?28 years ago If all above answers are "NO", may proceed with cephalosporin use.   . Erythromycin Nausea And Vomiting  . Morphine Nausea And Vomiting    Pt states morphine makes me have nausea and vomiting.  :   Her Current Medications Are:  Outpatient Encounter Medications as of 10/28/2019  Medication Sig  . acetaminophen (TYLENOL) 500 MG tablet Take 500 mg by mouth every 6 (six) hours as needed for mild pain.   Marland Kitchen albuterol (VENTOLIN HFA) 108 (90 Base) MCG/ACT inhaler Inhale 1-2 puffs into the lungs every 6 (six) hours as needed for wheezing or shortness of breath.  . ALPRAZolam (XANAX) 0.5 MG tablet Take 0.5 mg by mouth 3 (three) times daily as needed for anxiety.   Marland Kitchen aspirin EC 81 MG tablet Take 81 mg by mouth daily.  . Cholecalciferol (VITAMIN D-3) 125 MCG (5000 UT) TABS Take 5,000 Units by mouth daily.  . diphenhydrAMINE (BENADRYL) 25 MG tablet Take 25 mg by mouth at bedtime as needed for sleep (sleep or rash).  . DULoxetine (CYMBALTA) 60 MG capsule Take 60 mg by mouth daily.  Marland Kitchen escitalopram (LEXAPRO) 10 MG tablet Take 10 mg by mouth daily.  . famotidine (PEPCID) 20 MG tablet Take 20 mg by mouth every evening.   . hydrochlorothiazide (HYDRODIURIL) 25 MG tablet Take 25 mg by mouth daily.  . hyoscyamine (LEVSIN SL) 0.125 MG SL tablet Place 0.125 mg under the tongue every 4 (four) hours as needed (IBS).  . metFORMIN (GLUCOPHAGE-XR) 500 MG 24 hr tablet Take 500 mg by mouth 2 (two) times a day.  . montelukast (SINGULAIR) 10 MG tablet Take 10 mg by mouth daily.  . Multiple Vitamin (MULTIVITAMIN WITH MINERALS) TABS tablet Take 1 tablet by mouth daily. Women's One-A-Day Multivitamin  . Omega-3 Fatty Acids (FISH OIL) 1000  MG CAPS Take 1,000 mg by mouth daily.  Marland Kitchen spironolactone (ALDACTONE) 50 MG tablet TAKE 1 TABLET BY MOUTH  DAILY. TAKE WITH BREAKFAST. (Patient taking differently: Take 50 mg by mouth daily. )  . Trolamine Salicylate (ASPERCREME EX) Apply 1 application topically 4 (four) times daily as needed (pain.).  Marland Kitchen vitamin B-12 (CYANOCOBALAMIN) 1000 MCG tablet Take 1,000 mcg by mouth daily.   No facility-administered encounter medications on file as of 10/28/2019.  :  Review of Systems:  Out of a complete 14 point review of systems, all are reviewed and negative with the exception of these symptoms as listed below:  Virtual Visit via Video Note on 10/28/2019:   I connected with Marita Kansas  on 10/28/19 at 11:30 AM EST by a video enabled telemedicine application and verified that I am speaking with the correct person using two identifiers.   I discussed the limitations of evaluation and management by telemedicine and the availability of in person appointments. The patient expressed understanding and agreed to proceed.  History of Present Illness:  She reports generally doing okay, the reduced pressure is okay, we reduced it from 15/11 centimeters to 14/10.  She has had some interim stressors, new job, currently working from home.  Her mother moved in with them because of her health issues.  She is using a small Simplus full facemask and does notice the leak from time to time, sometimes she has the air blowing into her eyes, she has a tendency to dry eyes anyway.  Observations/Objective: On examination, she is pleasant and conversant, no acute distress, face is symmetric with normal facial animation, speech is clear without dysarthria, hypophonia or voice tremor, shoulder height is equal, extraocular tracking/movements are preserved, hearing grossly intact, airway examination shows mild to moderate mouth dryness, otherwise stable findings, tongue protrudes centrally in palate elevates symmetrically. Assessment and  Plan: In summary, DIARA CHAUDHARI is a very pleasant 49 year old female with an underlying medical history ofpolycystic ovarian syndrome, IBS, hypertension, reflux disease, morbid obesity, depression and anxiety, status post ventral hernia and umbilical hernia repairs, lumbar laminectomy, tonsillectomy and adenoidectomy,Hx of small bowel obstruction, who presents for follow up consultation of her severesleep disordered breathingwith evidence of severecentral apneas, and inadequate treatment with CPAP. She has donewell with BiPAP ST. She has been fully compliant with treatment. We reduced her pressurein 2016, due toexcessairleakfrom the mask. She is still experiencing air leakage from the mask. She has lost weight. We reduced her pressure from 15/11 cm to 14/10 cm in 2020, And her apnea scores are still very good, leak is about the same, she is agreeable to reducing the pressure further to 13/9 centimeters with a rate of 12/min. She is commended for her excellent treatment adherence.  She is motivated to continue with BiPAP therapy.  She uses a full facemask.  She is advised to try lubricant eyedrops for dry eyes, over-the-counter eyedrops. Of note, she had a split-night sleep study on 06/07/2014. She has a history of severe central apnea while on CPAP. She was tried on BiPAP, but did best on BiPAP ST. Residual AHI is less than 1 per hour. We reduced her pressure to 15/11 in 2017, rate at 12/m. She is advised to follow-up in one year. I answered all her questions today and she was in agreement. Follow Up Instructions:  1. Reduce pressure to see if leak can be lowered, BiPAP ST of 13/9 centimeters with a rate of 12/min. 2. Follow-up routinely in 1 year.    I discussed the assessment and treatment plan with the patient. The patient was provided an opportunity to ask questions and all were answered. The patient agreed with the plan and demonstrated an understanding of the instructions.   The patient  was advised to call back or seek an in-person evaluation if the symptoms worsen or if the condition fails to improve as anticipated.  I provided 20 minutes of non-face-to-face time during this encounter.   Star Age, MD

## 2019-10-28 NOTE — Patient Instructions (Signed)
Given verbally, during today's virtual video-based encounter, with verbal feedback received.   

## 2019-11-01 ENCOUNTER — Telehealth: Payer: Self-pay

## 2019-11-01 NOTE — Telephone Encounter (Signed)
I contacted the pt and left a vm asking her to call back so we could schedule her 1 year f/u.

## 2019-11-02 ENCOUNTER — Emergency Department (HOSPITAL_BASED_OUTPATIENT_CLINIC_OR_DEPARTMENT_OTHER)
Admission: EM | Admit: 2019-11-02 | Discharge: 2019-11-02 | Disposition: A | Discharge status: Home / Self Care | Payer: 59 | Attending: Emergency Medicine | Admitting: Emergency Medicine

## 2019-11-02 ENCOUNTER — Emergency Department (HOSPITAL_BASED_OUTPATIENT_CLINIC_OR_DEPARTMENT_OTHER): Payer: 59

## 2019-11-02 ENCOUNTER — Encounter (HOSPITAL_BASED_OUTPATIENT_CLINIC_OR_DEPARTMENT_OTHER): Payer: Self-pay | Admitting: Emergency Medicine

## 2019-11-02 ENCOUNTER — Other Ambulatory Visit: Payer: Self-pay

## 2019-11-02 DIAGNOSIS — I10 Essential (primary) hypertension: Secondary | ICD-10-CM | POA: Diagnosis not present

## 2019-11-02 DIAGNOSIS — J45909 Unspecified asthma, uncomplicated: Secondary | ICD-10-CM | POA: Diagnosis not present

## 2019-11-02 DIAGNOSIS — Z79899 Other long term (current) drug therapy: Secondary | ICD-10-CM | POA: Insufficient documentation

## 2019-11-02 DIAGNOSIS — Z885 Allergy status to narcotic agent status: Secondary | ICD-10-CM | POA: Insufficient documentation

## 2019-11-02 DIAGNOSIS — Z7982 Long term (current) use of aspirin: Secondary | ICD-10-CM | POA: Diagnosis not present

## 2019-11-02 DIAGNOSIS — K439 Ventral hernia without obstruction or gangrene: Secondary | ICD-10-CM | POA: Diagnosis not present

## 2019-11-02 DIAGNOSIS — Z880 Allergy status to penicillin: Secondary | ICD-10-CM | POA: Insufficient documentation

## 2019-11-02 DIAGNOSIS — Z7984 Long term (current) use of oral hypoglycemic drugs: Secondary | ICD-10-CM | POA: Insufficient documentation

## 2019-11-02 DIAGNOSIS — Z881 Allergy status to other antibiotic agents status: Secondary | ICD-10-CM | POA: Insufficient documentation

## 2019-11-02 DIAGNOSIS — Z888 Allergy status to other drugs, medicaments and biological substances status: Secondary | ICD-10-CM | POA: Diagnosis not present

## 2019-11-02 DIAGNOSIS — R112 Nausea with vomiting, unspecified: Secondary | ICD-10-CM | POA: Insufficient documentation

## 2019-11-02 DIAGNOSIS — Z87891 Personal history of nicotine dependence: Secondary | ICD-10-CM | POA: Insufficient documentation

## 2019-11-02 DIAGNOSIS — E119 Type 2 diabetes mellitus without complications: Secondary | ICD-10-CM | POA: Diagnosis not present

## 2019-11-02 DIAGNOSIS — R1084 Generalized abdominal pain: Secondary | ICD-10-CM

## 2019-11-02 LAB — CBC WITH DIFFERENTIAL/PLATELET
Abs Immature Granulocytes: 0.04 10*3/uL (ref 0.00–0.07)
Basophils Absolute: 0.1 10*3/uL (ref 0.0–0.1)
Basophils Relative: 1 %
Eosinophils Absolute: 0.3 10*3/uL (ref 0.0–0.5)
Eosinophils Relative: 3 %
HCT: 46 % (ref 36.0–46.0)
Hemoglobin: 15.4 g/dL — ABNORMAL HIGH (ref 12.0–15.0)
Immature Granulocytes: 0 %
Lymphocytes Relative: 26 %
Lymphs Abs: 2.7 10*3/uL (ref 0.7–4.0)
MCH: 30.3 pg (ref 26.0–34.0)
MCHC: 33.5 g/dL (ref 30.0–36.0)
MCV: 90.6 fL (ref 80.0–100.0)
Monocytes Absolute: 0.6 10*3/uL (ref 0.1–1.0)
Monocytes Relative: 6 %
Neutro Abs: 6.7 10*3/uL (ref 1.7–7.7)
Neutrophils Relative %: 64 %
Platelets: 301 10*3/uL (ref 150–400)
RBC: 5.08 MIL/uL (ref 3.87–5.11)
RDW: 13.2 % (ref 11.5–15.5)
WBC: 10.4 10*3/uL (ref 4.0–10.5)
nRBC: 0 % (ref 0.0–0.2)

## 2019-11-02 LAB — COMPREHENSIVE METABOLIC PANEL
ALT: 23 U/L (ref 0–44)
AST: 23 U/L (ref 15–41)
Albumin: 4.4 g/dL (ref 3.5–5.0)
Alkaline Phosphatase: 50 U/L (ref 38–126)
Anion gap: 12 (ref 5–15)
BUN: 15 mg/dL (ref 6–20)
CO2: 26 mmol/L (ref 22–32)
Calcium: 10 mg/dL (ref 8.9–10.3)
Chloride: 99 mmol/L (ref 98–111)
Creatinine, Ser: 0.75 mg/dL (ref 0.44–1.00)
GFR calc Af Amer: >60 mL/min (ref >60)
GFR calc non Af Amer: >60 mL/min (ref >60)
Glucose, Bld: 99 mg/dL (ref 70–99)
Potassium: 3.8 mmol/L (ref 3.5–5.1)
Sodium: 137 mmol/L (ref 135–145)
Total Bilirubin: 0.7 mg/dL (ref 0.3–1.2)
Total Protein: 7.5 g/dL (ref 6.5–8.1)

## 2019-11-02 LAB — URINALYSIS, ROUTINE W REFLEX MICROSCOPIC
Bilirubin Urine: NEGATIVE
Glucose, UA: NEGATIVE mg/dL
Hgb urine dipstick: NEGATIVE
Ketones, ur: NEGATIVE mg/dL
Leukocytes,Ua: NEGATIVE
Nitrite: NEGATIVE
Protein, ur: NEGATIVE mg/dL
Specific Gravity, Urine: 1.025 (ref 1.005–1.030)
pH: 6 (ref 5.0–8.0)

## 2019-11-02 LAB — LACTIC ACID, PLASMA: Lactic Acid, Venous: 1.3 mmol/L (ref 0.5–1.9)

## 2019-11-02 LAB — LIPASE, BLOOD: Lipase: 26 U/L (ref 11–51)

## 2019-11-02 MED ORDER — IOHEXOL 300 MG/ML  SOLN
100.0000 mL | Freq: Once | INTRAMUSCULAR | Status: AC | PRN
  Administered 2019-11-02: 100 mL via INTRAVENOUS

## 2019-11-02 MED ORDER — ONDANSETRON 4 MG PO TBDP
4.0000 mg | ORAL_TABLET | Freq: Once | ORAL | Status: AC
  Administered 2019-11-02: 4 mg via ORAL
  Filled 2019-11-02: qty 1

## 2019-11-02 MED ORDER — MORPHINE SULFATE (PF) 4 MG/ML IV SOLN
4.0000 mg | Freq: Once | INTRAVENOUS | Status: AC
  Administered 2019-11-02: 4 mg via INTRAVENOUS
  Filled 2019-11-02: qty 1

## 2019-11-02 MED ORDER — ONDANSETRON HCL 4 MG/2ML IJ SOLN
4.0000 mg | Freq: Once | INTRAMUSCULAR | Status: AC
  Administered 2019-11-02: 4 mg via INTRAVENOUS
  Filled 2019-11-02: qty 2

## 2019-11-02 MED ORDER — ONDANSETRON 4 MG PO TBDP: ORAL_TABLET | ORAL | 0 refills | Status: DC

## 2019-11-02 MED ORDER — SODIUM CHLORIDE 0.9 % IV BOLUS
1000.0000 mL | Freq: Once | INTRAVENOUS | Status: AC
  Administered 2019-11-02: 1000 mL via INTRAVENOUS

## 2019-11-02 NOTE — ED Provider Notes (Signed)
[facility] EMERGENCY DEPARTMENT Provider Note   CSN: [id number] Arrival date & time: 11/02/19  1551     History Chief Complaint  Patient presents with  . Abdominal Pain    Kelly Vargas is a 49 y.o. female.  Kelly Vargas is a 49 y.o. female with hx of GERD, 2 previous hernia repairs w/ mesh, previous SBO, incarcerated hernia, Diabetes, morbid obesity, who presents with generalized cramping abdominal pain that comes and goes. Abdominal pain has been present for two weeks but today she started having nausea and one episode of emesis. This concerned her so she came in for evaluation. States that she was last able to have a bowel movement on Friday when she had 3 episodes of diarrhea, has not been able to have any bowel movement since then despite using laxatives, was able to pass a small amount of gas this morning. She denies any associated fevers or chills. No chest pain, shortness of breath or cough. That she had concern for an incarcerated hernia in January, but was seen and this was able to be reduced, they wanted to avoid surgery due to her obesity. She states that her hernia has been harder over the past few days and more difficult to reduce. No other aggravating or alleviating factors.        Past Medical History:  Diagnosis Date  . Anxiety   . Asthma   . Complication of anesthesia   . Depression   . Diabetes mellitus without complication ([place])   . GERD (gastroesophageal reflux disease)   . Hernia   . Hypertension   . IBS (irritable bowel syndrome)   . Migraine   . Morbid obesity ([place])   . Polycystic ovarian disease   . PONV (postoperative nausea and vomiting)   . Rosacea   . Vargas apnea     Patient Active Problem List   Diagnosis Date Noted  . Generalized abdominal pain   . Type 2 diabetes mellitus without complication, without long-term current use of insulin ([place])   . Depression   . Anxiety   . SBO (small bowel obstruction) ([place]) 09/09/2018  .  Recurrent incisional hernia with incarceration 09/09/2018  . PCOS (polycystic ovarian syndrome) 03/02/2015  . Morbid obesity with BMI of 50.0-59.9, adult ([place]) 03/02/2015  . Hirsutism 03/02/2015  . High blood pressure associated with diabetes ([place]) 03/02/2015    Past Surgical History:  Procedure Laterality Date  . ADENOIDECTOMY    . BACK SURGERY    . DILATATION & CURETTAGE/HYSTEROSCOPY WITH MYOSURE N/A 02/17/2019   Procedure: DILATATION & CURETTAGE/HYSTEROSCOPY WITH MYOSURE;  Surgeon: Janyth Pupa, DO;  Location: [facility];  Service: Gynecology;  Laterality: N/A;  . HERNIA REPAIR    . TONSILECTOMY, ADENOIDECTOMY, BILATERAL MYRINGOTOMY AND TUBES    . UMBILICAL HERNIA REPAIR    . ventrical hernia repair       OB History   No obstetric history on file.     Family History  Problem Relation Age of Onset  . Heart disease Mother   . Cancer Mother   . Stroke Mother   . Diabetes Mother   . Hypertension Mother   . Heart disease Father   . Hypertension Brother   . High Cholesterol Brother   . Depression Brother     Social History   Tobacco Use  . Smoking status: Former Smoker    Packs/day: 1.00    Years: 20.00    Pack years: 20.00    Types: Cigarettes  Quit date: 09/02/2006    Years since quitting: 13.1  . Smokeless tobacco: Never Used  Substance Use Topics  . Alcohol use: Yes    Alcohol/week: 0.0 standard drinks    Comment: occasionally  . Drug use: No    Home Medications Prior to Admission medications   Medication Sig Start Date End Date Taking? Authorizing Provider  acetaminophen (TYLENOL) 500 MG tablet Take 500 mg by mouth every 6 (six) hours as needed for mild pain.     [provider]  albuterol (VENTOLIN HFA) 108 (90 Base) MCG/ACT inhaler Inhale 1-2 puffs into the lungs every 6 (six) hours as needed for wheezing or shortness of breath.    [provider]  ALPRAZolam Prudy Feeler) 0.5 MG tablet Take 0.5 mg by mouth 3 (three) times daily as needed for  anxiety.     [provider]  aspirin EC 81 MG tablet Take 81 mg by mouth daily.    [provider]  Cholecalciferol (VITAMIN D-3) 125 MCG (5000 UT) TABS Take 5,000 Units by mouth daily.    [provider]  diphenhydrAMINE (BENADRYL) 25 MG tablet Take 25 mg by mouth at bedtime as needed for Vargas (Vargas or rash).    [provider]  DULoxetine (CYMBALTA) 60 MG capsule Take 60 mg by mouth daily.    [provider]  escitalopram (LEXAPRO) 10 MG tablet Take 10 mg by mouth daily.    [provider]  famotidine (PEPCID) 20 MG tablet Take 20 mg by mouth every evening.     [provider]  hydrochlorothiazide (HYDRODIURIL) 25 MG tablet Take 25 mg by mouth daily.    [provider]  hyoscyamine (LEVSIN SL) 0.125 MG SL tablet Place 0.125 mg under the tongue every 4 (four) hours as needed (IBS).    [provider]  metFORMIN (GLUCOPHAGE-XR) 500 MG 24 hr tablet Take 500 mg by mouth 2 (two) times a day.    [provider]  montelukast (SINGULAIR) 10 MG tablet Take 10 mg by mouth daily.    [provider]  Multiple Vitamin (MULTIVITAMIN WITH MINERALS) TABS tablet Take 1 tablet by mouth daily. Women's One-A-Day Multivitamin    [provider]  Omega-3 Fatty Acids (FISH OIL) 1000 MG CAPS Take 1,000 mg by mouth daily.    [provider]  spironolactone (ALDACTONE) 50 MG tablet TAKE 1 TABLET BY MOUTH  DAILY. TAKE WITH BREAKFAST. Patient taking differently: Take 50 mg by mouth daily.  07/04/17   Orvilla Cornwall A, CNM  Trolamine Salicylate (ASPERCREME EX) Apply 1 application topically 4 (four) times daily as needed (pain.).    [provider]  vitamin B-12 (CYANOCOBALAMIN) 1000 MCG tablet Take 1,000 mcg by mouth daily.    [provider]    Allergies    Chamomile, Levomenol, Penicillins, Erythromycin, and Morphine  Review of Systems   Review of Systems  Constitutional: Negative  for chills and fever.  HENT: Negative.   Respiratory: Negative for cough and shortness of breath.   Cardiovascular: Negative for chest pain.  Gastrointestinal: Positive for abdominal pain, constipation, nausea and vomiting.  Genitourinary: Negative for dysuria and frequency.  Musculoskeletal: Negative for arthralgias and myalgias.  Skin: Negative for color change and rash.  All other systems reviewed and are negative.   Physical Exam Updated Vital Signs BP (!) 152/98 (BP Location: Left Arm)   Pulse 87   Temp 98.3 F (36.8 C) (Oral)   Resp 18   Ht 6' (1.829  m)   Wt (!) 170.1 kg   LMP 01/12/2016   SpO2 99%   BMI 50.86 kg/m   Physical Exam Vitals and nursing note reviewed.  Constitutional:      General: She is not in acute distress.    Appearance: She is well-developed. She is obese. She is not ill-appearing or diaphoretic.  HENT:     Head: Normocephalic and atraumatic.     Mouth/Throat:     Mouth: Mucous membranes are moist.     Pharynx: Oropharynx is clear.  Eyes:     General:        Right eye: No discharge.        Left eye: No discharge.  Cardiovascular:     Rate and Rhythm: Normal rate and regular rhythm.     Heart sounds: Normal heart sounds. No murmur. No friction rub. No gallop.   Pulmonary:     Effort: Pulmonary effort is normal. No respiratory distress.     Breath sounds: Normal breath sounds. No wheezing or rales.     Comments: Respirations equal and unlabored, patient able to speak in full sentences, lungs clear to auscultation bilaterally Abdominal:     General: Bowel sounds are normal. There is no distension.     Palpations: Abdomen is soft. There is no mass.     Tenderness: There is no abdominal tenderness. There is no guarding.     Hernia: A hernia is present. Hernia is present in the ventral area.     Comments: Obese abdomen soft without distention, large ventral hernia present just to the right of the umbilicus.  Some mild upper abdominal tenderness  noted and tenderness around the hernia.  I was unable to reduce hernia myself.  Bowel sounds present throughout.  No lower abdominal tenderness.  Musculoskeletal:        General: No deformity.     Cervical back: Neck supple.  Skin:    General: Skin is warm and dry.     Capillary Refill: Capillary refill takes less than 2 seconds.  Neurological:     Mental Status: She is alert.     Coordination: Coordination normal.     Comments: Speech is clear, able to follow commands Moves extremities without ataxia, coordination intact  Psychiatric:        Mood and Affect: Mood normal.        Behavior: Behavior normal.     ED Results / Procedures / Treatments   Labs (all labs ordered are listed, but only abnormal results are displayed) Labs Reviewed  CBC WITH DIFFERENTIAL/PLATELET - Abnormal; Notable for the following components:      Result Value   Hemoglobin 15.4 (*)    All other components within normal limits  URINALYSIS, ROUTINE W REFLEX MICROSCOPIC - Abnormal; Notable for the following components:   APPearance HAZY (*)    All other components within normal limits  COMPREHENSIVE METABOLIC PANEL  LIPASE, BLOOD  LACTIC ACID, PLASMA    EKG None  Radiology CT ABDOMEN PELVIS W CONTRAST  Result Date: 11/02/2019 CLINICAL DATA:  Acute abdominal pain. Vomiting. Previous small bowel obstruction. EXAM: CT ABDOMEN AND PELVIS WITH CONTRAST TECHNIQUE: Multidetector CT imaging of the abdomen and pelvis was performed using the standard protocol following bolus administration of intravenous contrast. CONTRAST:  OMNIPAQUE IOHEXOL 300 MG/ML  SOLN COMPARISON:  CT scan of the abdomen dated 03/16/2019 FINDINGS: Lower chest: Normal. Hepatobiliary: Chronic hepatic steatosis. No focal liver lesions. Biliary tree is normal. Pancreas: Unremarkable. No pancreatic  ductal dilatation or surrounding inflammatory changes. Spleen: Normal in size without focal abnormality. Adrenals/Urinary Tract: The adrenal glands  are normal. Lobulated contour of the kidneys bilaterally, stable. No mass lesion or hydronephrosis or renal or ureteral calculi. Bladder is normal. Stomach/Bowel: There is a large bilobed anterior abdominal wall hernia containing loops of large and small bowel. The cecum is now within the hernia, new since the prior study. No dilated bowel proximal or distal to the hernia. Scattered diverticula in the distal colon. Appendix is normal. Stomach appears normal. Vascular/Lymphatic: No significant vascular findings are present. No enlarged abdominal or pelvic lymph nodes. Reproductive: Uterus and bilateral adnexa are unremarkable. Other: No ascites. Musculoskeletal: The defect in the anterior abdominal wall is approximately 13.5 cm in diameter at the site of the hernia containing large and small bowel. No acute bone abnormality. Chronic severe bilateral facet arthritis at L3-4 with moderately severe spinal stenosis. IMPRESSION: 1. Large bilobed anterior abdominal wall hernia containing loops of large and small bowel. The cecum is now within the hernia, new since the prior study. No evidence of bowel obstruction. 2. Chronic hepatic steatosis. 3. Chronic severe bilateral facet arthritis at L3-4 with moderately severe spinal stenosis. Electronically Signed   By: Francene Boyers M.D.   On: 11/02/2019 18:20    Procedures Procedures (including critical care time)  Medications Ordered in ED Medications  sodium chloride 0.9 % bolus 1,000 mL (1,000 mLs Intravenous New Bag/Given 11/02/19 1706)  ondansetron (ZOFRAN) injection 4 mg (4 mg Intravenous Given 11/02/19 1704)  morphine 4 MG/ML injection 4 mg (4 mg Intravenous Given 11/02/19 1706)  iohexol (OMNIPAQUE) 300 MG/ML solution 100 mL (100 mLs Intravenous Contrast Given 11/02/19 1757)    ED Course  I have reviewed the triage vital signs and the nursing notes.  Pertinent labs & imaging results that were available during my care of the patient were reviewed by me and  considered in my medical decision making (see chart for details).    MDM Rules/Calculators/A&P                     49 year old female with complex abdominal surgical history with multiple hernia repairs presents to the ED with generalized abdominal pain intermittently over the past 2 weeks, but today she had an episode of vomiting and has not been able to pass any stools since Friday.  She has a large ventral hernia which she reports she is usually able to reduce but has not been able to get it to go back in today.  She was concerned that she could be developing another obstruction.  On arrival patient is well-appearing and in no distress with stable vitals.  Will get abdominal labs and lactic acid and CT abdomen pelvis.  I was not able to reduce patient's hernia, but Dr. Clarice Pole went in to assess the patient and was able to completely reduce hernia.  Patient's lab work is reassuring, no leukocytosis, no electrolyte derangements, normal renal and liver function normal lipase.  Urinalysis without signs of infection.  Lactic acid is not elevated.  CT scan shows ventral hernia now containing some of the cecum, but without stranding or signs of bowel inflammation, no evidence of obstruction, no other acute findings noted.  On reevaluation patient reports her pain has significantly improved and she is feeling better.  She has had no further emesis.  Will discharge home with close outpatient follow-up with her primary doctor and previous surgeon.  Prescribe Zofran as needed for home.  Discharged home in good condition.  Final Clinical Impression(s) / ED Diagnoses Final diagnoses:  Ventral hernia without obstruction or gangrene  Generalized abdominal pain  Non-intractable vomiting with nausea, unspecified vomiting type    Rx / DC Orders ED Discharge Orders    None       Legrand Rams 11/02/19 Margretta Ditty    Arby Barrette, MD 11/02/19 2344

## 2019-11-02 NOTE — Discharge Instructions (Signed)
Your work-up today is reassuring, labs and CT scan look good and I am glad that we were able to reduce your hernia.  Use Zofran as needed for nausea at home.  If you have persistent vomiting, or unable to pass any gas or bowel movements and you are not able to reduce your hernia at home or any other new or concerning symptoms occur return to the ED.

## 2019-11-02 NOTE — ED Triage Notes (Signed)
Generalized abd pain with vomiting since yesterday. She is concerned for SBO

## 2020-02-15 IMAGING — DX DG ABDOMEN 1V
1 series · 1 of 1 positions shown · non-contrast
Comparison: CT earlier this day.

CLINICAL DATA: NG tube placement.

EXAM:
ABDOMEN - 1 VIEW

[abdomen kub]
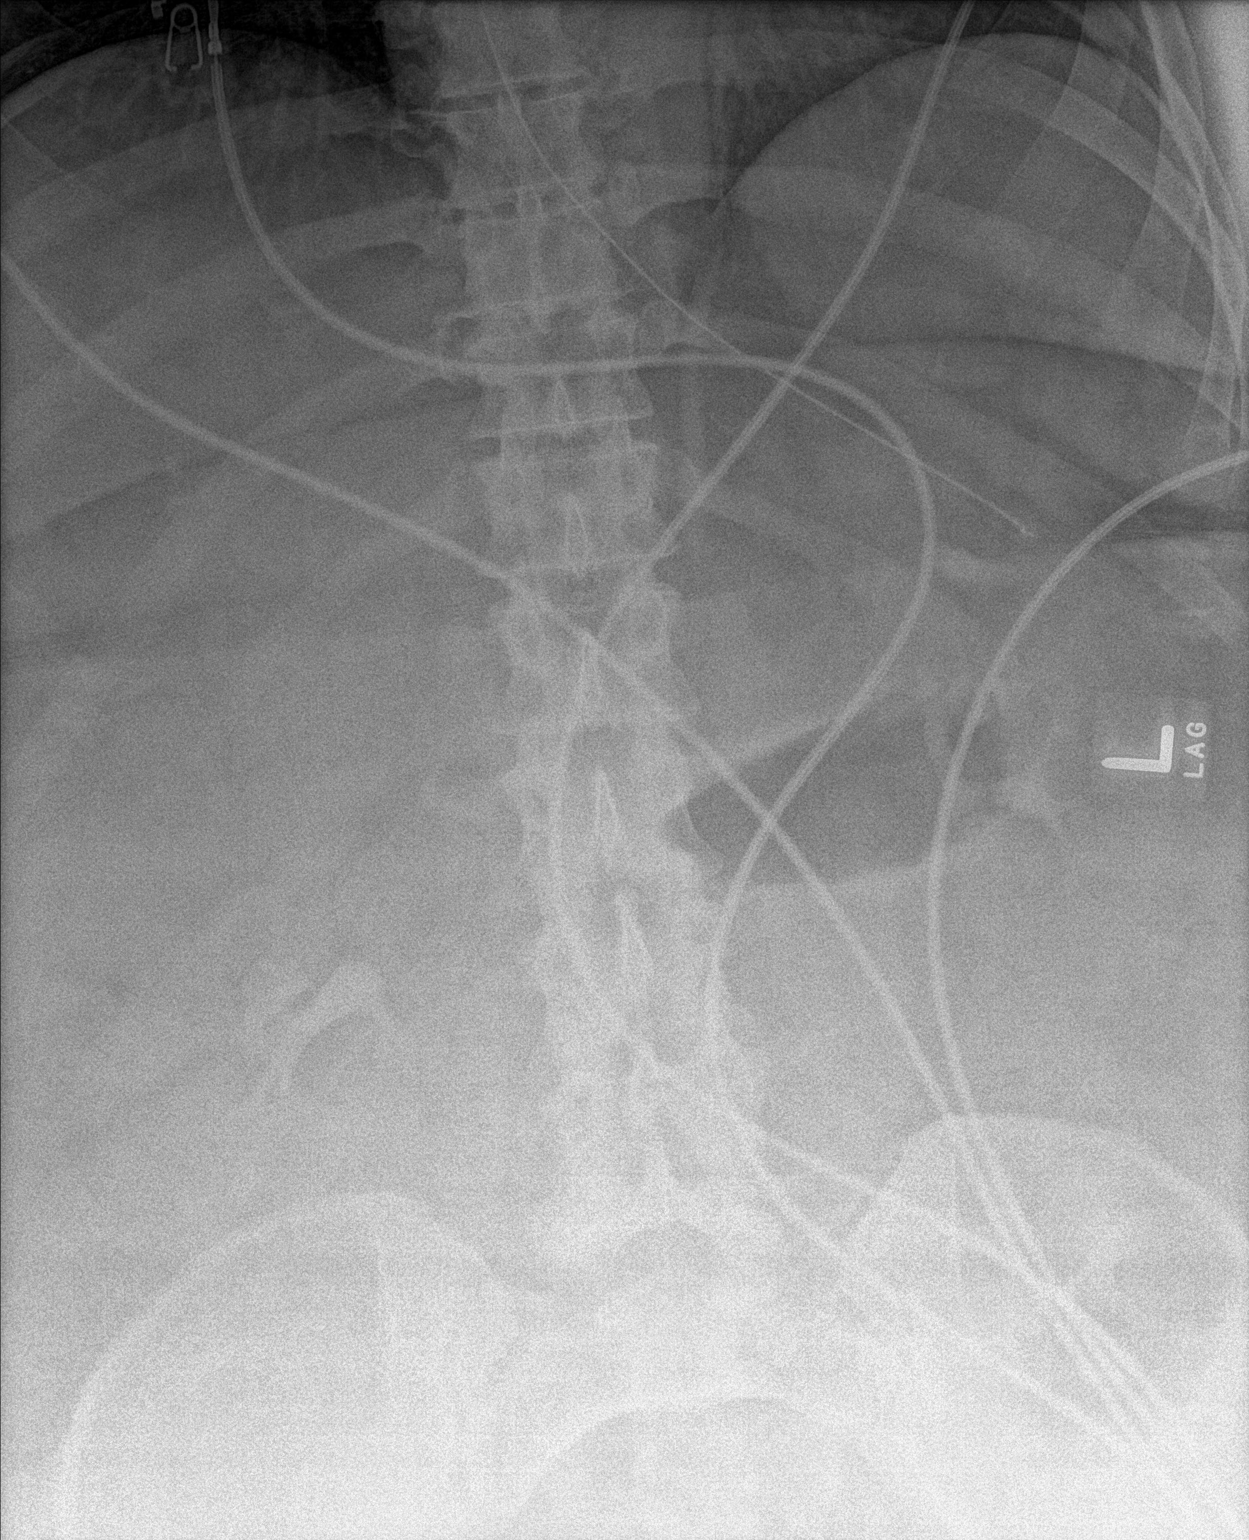

[1 of 1 positions shown; findings below may reference images not displayed]

FINDINGS: Tip and side port of the enteric tube below the diaphragm in the
stomach. Fluid-filled dilated bowel on CT is not seen by radiograph.
Excreted IV contrast in the renal collecting systems from prior CT.
IMPRESSION: Tip and side port of the enteric tube below the diaphragm in the
stomach.

## 2020-10-17 ENCOUNTER — Encounter: Payer: Self-pay | Admitting: Neurology

## 2020-10-17 ENCOUNTER — Telehealth (INDEPENDENT_AMBULATORY_CARE_PROVIDER_SITE_OTHER): Payer: 59 | Admitting: Neurology

## 2020-10-17 DIAGNOSIS — G4731 Primary central sleep apnea: Secondary | ICD-10-CM

## 2020-10-17 DIAGNOSIS — G4733 Obstructive sleep apnea (adult) (pediatric): Secondary | ICD-10-CM | POA: Diagnosis not present

## 2020-10-17 NOTE — Patient Instructions (Signed)
Given verbally, during today's virtual video-based encounter, with verbal feedback received.   

## 2020-10-17 NOTE — Progress Notes (Addendum)
Interim history:  Kelly Vargas is a 50 year old right-handed woman with an underlying medical history of polycystic ovarian syndrome, IBS, hypertension, reflux disease, obesity, depression and anxiety, PTSD, status post ventral hernia and umbilical hernia repairs, lumbar laminectomy, tonsillectomy and adenoidectomy, who presents for a virtual visit via MyChart video visit for follow-up consultation of her complex sleep apnea, treated with BiPAP ST. The patient is unaccompanied today and presents via computer from home, I am located in my office. I last saw her on 10/28/19 in a virtual visit, at which time she was compliant with her BiPAP ST.  She reported recent stressors, including work-related and personal stressors.  She was advised to follow-up routinely in 1 year.  Today, 10/17/2020: I reviewed her BiPAP compliance data from 09/17/2020 through 10/16/2020, which is a total of 29 days, during which time she used her machine every night with an average usage of 9 hours and 41 minutes, residual AHI at 1.1/h, leak acceptable with a 95th percentile at 11.5 L/min on a pressure of 14/10 centimeters with a backup rate of 12/min.  She reports doing well with her machine.  She has started seeing a bariatric specialist through [facility] and has been able to lose weight.  Thus far, she has lost about 50 pounds.  She is pleased with her progress.  She is on Ozempic currently.  It caused her to have insomnia and she was started recently on trazodone low-dose.  Other than that she is fully compliant with her BiPAP and up-to-date with her supplies and benefits from it.  She has seen a message on the machine indicating that the motor life has exceeded the typical time.  She continues to work from home, she works for [employer].  Mom moved back to her own home and her older brother moved in with mom.  She has also recently reconnected with her father after about 13 years and reports that he has been diagnosed with  end-stage lung disease.  The patient's allergies, current medications, family history, past medical history, past social history, past surgical history and problem list were reviewed and updated as appropriate.    Previously:   I reviewed her BiPAP ST compliance data from 09/27/2019 through 08/24/20, which is a total of 29 days, during which time she used her machine every night, with 100% compliance, average usage of 9 hours and 27 minutes, residual AHI at goal at 0.5/h, leak on the higher side with a 95th percentile at 38.2 L/min on a pressure of 14/10 with a rate of 12.   I saw her on 10/26/2018, at which time she was compliant with her BiPAP.  She had a recent hospitalization for small bowel obstruction.  She had an incarcerated hernia.  She had lost some weight, she needed to lose more weight to pursue hernia surgery.   I saw her on 10/23/2017, at which time she was fully compliant with her BiPAP. Unfortunately, she had a lot of stressors. She was in grad school, she had family members who were battling cancer. She was supposed to establish care with a new GYN and she had been experiencing foot pain. Her A1c had risen.   I reviewed her BiPAP compliance data from 09/22/2018 through 10/21/2018 which is a total of 30 days, during which time she used her machine every night with percent used days greater than 4 hours at 100%, indicating superb compliance and high average usage of 11 hours and 29 minutes, residual AHI at goal at  0.4 per hour, leak high, albeit better than before, with the 95th percentile of 36.1 L/m on a pressure of 15/11 with a backup rate of 12.    Of note, she missed an appointment on 08/21/2017. I saw her on 08/21/2016, at which time she was fully compliant with her BiPAP ST. She was doing well overall. She was advised to follow-up routinely in one year. I did suggest we reduced her BiPAP pressure because of high leak. I reduced to 15/11 cm with a rate of 12.   I reviewed her  BiPAP compliance data from 09/22/2017 through 10/21/2017 which is a total of 30 days, during which time she used her BiPAP every night with percent used days greater than 4 hours at 100%, indicating superb compliance with an average usage of 8 hours and 26 minutes, residual AHI at goal at 0.5 per hour, leak high with the 95th percentile at 50.8 L/m on a pressure of 15/11 cm with a backup rate of 12.    I saw her on 08/22/2015, at which time she was compliant with BiPAP ST. She had more stress secondary to working full time and she was in online school as well. She had started spironolactone 50 mg once daily and an OCP for her PCOS. She had lost a little bit of weight. She was complaining of urinary urgency and nocturia as well as incontinence at times.    08/21/2016: The reviewed her BiPAP compliance data from 07/20/2016 through 08/18/2016 which is a total of 30 days, during which time she used her BiPAP every night with percent used days greater than 4 hours at 100%, indicating superb compliance with an average usage of 8 hours and 28 minutes, residual AHI low at 0.6 per hour, leak with the 95th percentile at 73 L/m on a pressure of 17/13 with a rate of 12.    I saw her on 02/20/2015, at which time she reported doing fairly well. She was trying to lose weight and had lost about 11 pounds. 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 She was enrolling in the masters program. She was seeking care with a new primary care physician. She was using a small fullface mask. She was compliant with BiPAP treatment. I encouraged her to continue with treatment and to strive for weight loss.   I reviewed her BiPAP compliance data from 07/22/2015 through 08/20/2015 which is a total of 30 days during which time she used her machine every night with percent used days greater than 4 hours at 97%, indicating excellent compliance with an average usage of 8 hours and 29 minutes, residual AHI low at 0.1 per hour, leak  consistently high with the 95th percentile of leak at 65.1 L/m on a pressure of 17/13 with a rate of 12.   I saw her on 10/27/2014, at which time she reported doing fairly well. Unfortunately, leak from the mask was still high. Her lower extremity edema was no better. Her blood pressure values were good. Her nocturia was much improved from before treatment. I requested a reduction in her pressure to 19/15 cm.   I reviewed her BiPAP compliance data from 01/18/2015 through 02/16/2015 which is a total of 30 days during which time she used her machine every night, percent used days greater than 4 hours was under percent, indicating superb compliance with an average usage of 8 hours and 34 minutes for all nights, residual AHI low at 0.2 per hour but leak still  high, albeit improved with the 95th percentile at 63.2 L/m. Pressure at 19/15 with a rate of 12.   I saw her on 07/26/2014, at which time we talked about her sleep test results and her compliance data. She had obstructive sleep apnea which did not respond completely to CPAP therapy and standard BiPAP therapy and she started having central apneas on standard BiPAP. She was therefore switched to BiPAP ST. She had good compliance and indicated good results. She reported sleeping better and improvement of her nocturia as well as improved daytime alertness. Leak was high and I suggested we request a mask refit and reduction in BiPAP pressure 19/15 cm but both things were actually not gone. I have emailed her DME provider today. The patient states that she was mailed another fullface mask but this was even worse in terms of fit and her mouth would come open. She is trying to get on track with her weight loss.    I reviewed her BiPAP ST compliance data from 09/24/2014 through 10/23/2014 which is a total of 30 days during which time she was under percent compliant. Average usage of 7 hours and 52 minutes with a pressure of 21/17 and the rate of 12. Residual AHI at  1 per hour and leak at times high with the 95th percentile at 75.6 L/m.   I first met her on 05/13/2014 at the request of her psychiatrist, at which time the patient reported difficulty with sleep maintenance with frequent nighttime awakenings, snoring, nocturia, and witnessed apneas. I asked her to return for a sleep study. She had a split-night sleep study on 06/07/2014 and went over her test results with her in detail today. Her baseline sleep efficiency was 62.9% with a latency to sleep of 15.5 minutes and wake after sleep onset of 21 minutes with moderate sleep fragmentation noted. She had an increased percentage of stage I sleep, an increased percentage of slow-wave sleep, and absence of REM sleep. No EKG changes were noted and no significant PLMS. She had loud snoring. She slept on her back only. Her total AHI was 61 per hour. Baseline oxygen saturation was only 88%, nadir was 80%. She was therefore started on CPAP therapy. Sleep efficiency was improved. She had an increased percentage of REM sleep at 39.3%. Average oxygen saturation was 89%, nadir was 78%. The patient was started on CPAP. She was first tried on a nasal mask but had significant mouth opening. She was titrated from 5-15 cm. She had significant residual sleep disordered breathing and therefore was switched to BiPAP therapy. She was titrated from 16/12 cm to 20/16 cm. She had central apneas. She was switched to BiPAP ST. She was titrated to a final pressure of 21/17 with a rate of 12. Residual AHI was 0 on the final setting with her O2 nadir of 89% on the final setting. Based on her test results I tarted her on BiPAP ST at home.   I reviewed her BiPAP compliance data from 06/21/2014 through 07/20/2014 which is a total of 30 days during which time she was under percent compliant. Percent used days greater than 4 hours was 100%, indicating superb compliance. Average usage of 8 hours and 17 minutes. Pressure at 21/17 with a rate of 12. Residual  AHI 1 per hour, leak was high.   She works from home for [employer]. She has had sleep walking and sleep eating into adulthood. She has tried Nyquil, Xanax, benadryl. She used to get morning HAs, but these  improved since she starting using a custom made bite guard for bruxism. She has had nocturia x 2-3 times, but also nocturnal incontinence. She denies RLS symptoms and is not known to kick in her sleep.   Her typical bedtime is reported to be around MN and usual wake time is around 7:30 AM. Sleep onset typically occurs within minutes after reading on her Kindle. She reports feeling poorly rested upon awakening. She wakes up on an average 3 times in the middle of the night and has to go to the bathroom 2-3 times on a typical night.   She reports excessive daytime somnolence (EDS) and Her Epworth Sleepiness Score (ESS) is 6/24 today. She has not fallen asleep while driving. The patient has been taking a nap during lunch time at times.   She has been known to snore for the past many years. Snoring is reportedly marked, and associated with choking sounds and witnessed apneas. The patient admits to a sense of choking or strangling feeling. There is report of nighttime reflux, and she has to sleep on 2 pillows. She prefers to sleep on the R side. There is no nighttime cough experienced. The patient has not noted any RLS symptoms and is not known to kick while asleep or before falling asleep. There is family history of insomnia, but no obvious RLS or OSA.   She is a restless sleeper and in the morning, the bed is quite disheveled.    She denies cataplexy, sleep paralysis, hypnagogic or hypnopompic hallucinations, or sleep attacks. She does report any vivid dreams, some nightmares, no dream enactments, but parasomnias, such as sleep eating, and sleep walking. The patient has not had a sleep study or a home sleep test.   She consumes 5-6 caffeinated beverages per day, usually in the form of coffee and sodas, as late as  dinner. Her bedroom is usually dark and cool. There is no TV in the bedroom.    Her Past Medical History Is Significant For: Past Medical History:  Diagnosis Date  . Anxiety   . Asthma   . Complication of anesthesia   . Depression   . Diabetes mellitus without complication ([place])   . GERD (gastroesophageal reflux disease)   . Hernia   . Hypertension   . IBS (irritable bowel syndrome)   . Migraine   . Morbid obesity ([place])   . Polycystic ovarian disease   . PONV (postoperative nausea and vomiting)   . Rosacea   . Sleep apnea     Her Past Surgical History Is Significant For: Past Surgical History:  Procedure Laterality Date  . ADENOIDECTOMY    . BACK SURGERY    . DILATATION & CURETTAGE/HYSTEROSCOPY WITH MYOSURE N/A 02/17/2019   Procedure: DILATATION & CURETTAGE/HYSTEROSCOPY WITH MYOSURE;  Surgeon: Janyth Pupa, DO;  Location: [facility];  Service: Gynecology;  Laterality: N/A;  . HERNIA REPAIR    . TONSILECTOMY, ADENOIDECTOMY, BILATERAL MYRINGOTOMY AND TUBES    . UMBILICAL HERNIA REPAIR    . ventrical hernia repair      Her Family History Is Significant For: Family History  Problem Relation Age of Onset  . Heart disease Mother   . Cancer Mother   . Stroke Mother   . Diabetes Mother   . Hypertension Mother   . Heart disease Father   . Hypertension Brother   . High Cholesterol Brother   . Depression Brother     Her Social History Is Significant For: Social History   Socioeconomic History  .  Marital status: Legally Separated    Spouse name: [name]  . Number of children: 0  . Years of education: [employer].  . Highest education level: Not on file  Occupational History    Employer: [employer]  Tobacco Use  . Smoking status: Former Smoker    Packs/day: 1.00    Years: 20.00    Pack years: 20.00    Types: Cigarettes    Quit date: 09/02/2006    Years since quitting: 14.1  . Smokeless tobacco: Never Used  Vaping Use  . Vaping Use: Never used  Substance and Sexual  Activity  . Alcohol use: Yes    Alcohol/week: 0.0 standard drinks    Comment: occasionally  . Drug use: No  . Sexual activity: Yes    Partners: Female  Other Topics Concern  . Not on file  Social History Narrative   Patient lives at home with spouse.   Caffeine Use: 5-6 cups of coffee daily   Social Determinants of Health   Financial Resource Strain: Not on file  Food Insecurity: Not on file  Transportation Needs: Not on file  Physical Activity: Not on file  Stress: Not on file  Social Connections: Not on file    Her Allergies Are:  Allergies  Allergen Reactions  . Chamomile Swelling    Lips and mouth  . Levomenol Swelling  . Penicillins Hives    Did it involve swelling of the face/tongue/throat, SOB, or low BP? No Did it involve sudden or severe rash/hives, skin peeling, or any reaction on the inside of your mouth or nose? Yes Did you need to seek medical attention at a hospital or doctor's office? No When did it last happen?28 years ago If all above answers are "NO", may proceed with cephalosporin use.   . Erythromycin Nausea And Vomiting  . Morphine Nausea And Vomiting    Pt states morphine makes me have nausea and vomiting.  :   Her Current Medications Are:  Outpatient Encounter Medications as of 10/17/2020  Medication Sig  . acetaminophen (TYLENOL) 500 MG tablet Take 500 mg by mouth every 6 (six) hours as needed for mild pain.   Marland Kitchen albuterol (VENTOLIN HFA) 108 (90 Base) MCG/ACT inhaler Inhale 1-2 puffs into the lungs every 6 (six) hours as needed for wheezing or shortness of breath.  . ALPRAZolam (XANAX) 0.5 MG tablet Take 0.5 mg by mouth 3 (three) times daily as needed for anxiety.   Marland Kitchen aspirin EC 81 MG tablet Take 81 mg by mouth daily.  . Cholecalciferol (VITAMIN D-3) 125 MCG (5000 UT) TABS Take 5,000 Units by mouth daily.  . diphenhydrAMINE (BENADRYL) 25 MG tablet Take 25 mg by mouth at bedtime as needed for sleep (sleep or rash).  . DULoxetine (CYMBALTA) 60 MG  capsule Take 60 mg by mouth daily.  Marland Kitchen escitalopram (LEXAPRO) 10 MG tablet Take 10 mg by mouth daily.  . famotidine (PEPCID) 20 MG tablet Take 20 mg by mouth every evening.   . hydrochlorothiazide (HYDRODIURIL) 25 MG tablet Take 25 mg by mouth daily.  . hyoscyamine (LEVSIN SL) 0.125 MG SL tablet Place 0.125 mg under the tongue every 4 (four) hours as needed (IBS).  . metFORMIN (GLUCOPHAGE-XR) 500 MG 24 hr tablet Take 500 mg by mouth 2 (two) times a day.  . montelukast (SINGULAIR) 10 MG tablet Take 10 mg by mouth daily.  . Multiple Vitamin (MULTIVITAMIN WITH MINERALS) TABS tablet Take 1 tablet by mouth daily. Women's One-A-Day Multivitamin  . Omega-3 Fatty  Acids (FISH OIL) 1000 MG CAPS Take 1,000 mg by mouth daily.  . ondansetron (ZOFRAN ODT) 4 MG disintegrating tablet 53m ODT q4 hours prn nausea/vomit  . spironolactone (ALDACTONE) 50 MG tablet TAKE 1 TABLET BY MOUTH  DAILY. TAKE WITH BREAKFAST. (Patient taking differently: Take 50 mg by mouth daily. )  . Trolamine Salicylate (ASPERCREME EX) Apply 1 application topically 4 (four) times daily as needed (pain.).  .Marland Kitchenvitamin B-12 (CYANOCOBALAMIN) 1000 MCG tablet Take 1,000 mcg by mouth daily.   No facility-administered encounter medications on file as of 10/17/2020.  :  Review of Systems:  Out of a complete 14 point review of systems, all are reviewed and negative with the exception of these symptoms as listed below:  Virtual Visit via Video Note on 10/17/2020:   I connected with Kelly Vargas on 10/17/20 at  9:30 AM EST by a video enabled telemedicine application and verified that I am speaking with the correct person using two identifiers.   I discussed the limitations of evaluation and management by telemedicine and the availability of in person appointments. The patient expressed understanding and agreed to proceed.  History of Present Illness: See above.    Observations/Objective: She is pleasant and conversant, no acute distress, tracking well  preserved, corrective eyeglasses in place.  Upper body movements normal, speech is clear without dysarthria, hypophonia or voice tremor.  Good comprehension.  Good spirits.  Assessment and Plan: In summary, KSTEPHANIEMARIE STOFFELis a very pleasant 50year old female with an underlying medical history ofpolycystic ovarian syndrome, IBS, hypertension, reflux disease, morbid obesity, depression and anxiety, status post ventral hernia and umbilical hernia repairs, lumbar laminectomy, tonsillectomy and adenoidectomy,Hx of small bowel obstruction,who presents for follow up consultation of her severesleep disordered breathingwith evidence of severecentral apneas, and hx of inadequate treatment with CPAP. She has donewell with BiPAP ST. She has been fully compliant with treatment. We reduced her pressurein 2016, due toexcessairleakfrom the mask. She is working on weight loss and has lost 50 pounds thus far.  We reduced her pressure from 15/11 cm to 14/10 cm in 2020.  We talked about reducing her pressure last year but she is currently still on 14/10.  She does not mind this pressure and is fully compliant.  Her apnea scores are very low and leak is acceptable.  With further weight loss, we can certainly consider reducing her pressure but at this time we will keep everything stable.  Since her machine's Motor life (corrected on 10/31/20) has been exceeded, I will try to get her a new machine through her DME company, [employer].  I will write for a new machine and we will utilize current office notes and prior sleep study results to get her a new machine as she has done well on it and I do not believe she needs a new set of testing.  If mandated otherwise by her insurance, we will let her know.  Once she gets started on a new machine she is advised that she will need to be seen within 60 to 90 days of starting treatment with new equipment even if the settings are exactly the same.  She is agreeable to this.  If we  end up not getting a new machine this year, and she uses the current machine, she can follow-up routinely in 1 year to see one of our nurse practitioners.  She is commended for her excellent treatment adherence.  She is motivated to continue with BiPAP therapy.  She uses  a full facemask.  Of note, she had a split-night sleep study on 06/07/2014.She has a history of severe central apnea while on CPAP. She was tried on BiPAP, but did best on BiPAP ST. Residual AHI is about 1 per hour. We reduced her pressure to 15/11 in 2017, rate at 12/m.  I answered all her questions today and she was in agreement.  Follow Up Instructions:  1.   Continue treatment, I will write for new machine.  If she starts using a new machine, she will need to follow-up within 60-90 days.   2.  If she does not get a new machine, she can follow-up routinely in 1 year, [facility] nurse practitioner.     I discussed the assessment and treatment plan with the patient. The patient was provided an opportunity to ask questions and all were answered. The patient agreed with the plan and demonstrated an understanding of the instructions.   The patient was advised to call back or seek an in-person evaluation if the symptoms worsen or if the condition fails to improve as anticipated.  I provided 30 minutes of non-face-to-face time during this encounter.   Star Age, MD

## 2020-10-31 ENCOUNTER — Telehealth: Payer: Self-pay

## 2020-10-31 NOTE — Telephone Encounter (Signed)
Patient told me at the time of our visit that her machine showed an error message stating that the motor life had been exceeded.  I did put this in my note but had to correct a typo in the assessment and plan in that regard.  Please check with [employer] if she is eligible for a new BiPAP ST based on a error message on her machine.

## 2020-10-31 NOTE — Telephone Encounter (Signed)
Received this message from [employer] today. I had requested authorization for this pt's new BiPAP ST and it was denied stating notes don't state that the pt's current machine is malfunctioning. I contacted the auth department at [facility] regarding this and they are going to be initiating a peer-to-peer review to get this looked at. I wanted to give you a heads up that someone will probably be reaching out to your office to discuss this.

## 2020-11-01 NOTE — Telephone Encounter (Signed)
I reached out to [place] at [employer]. Shanda Bumps states she did include the information about the error message for the motor life but pt's insurance is still not covering for the new machine even though she is eligable for new machine. Shanda Bumps sts once peer to peer is completed and if denial is still given she can process with the appeal process.

## 2020-11-23 NOTE — Telephone Encounter (Signed)
I message Kelly Vargas with [employer] [facility] to check status on this today.

## 2020-11-27 NOTE — Telephone Encounter (Signed)
Received feedback from [employer]  I submitted an appeal via fax 11/13/20, which can take up to 30 days for an answer. I hope it'll be approved, but since the notes don't state pt's machine is malfunctioning or otherwise unusable, I don't know if this will be approved. I'll let you know as soon as I have an answer.

## 2020-11-30 ENCOUNTER — Telehealth: Payer: Self-pay | Admitting: Neurology

## 2020-11-30 ENCOUNTER — Encounter: Payer: Self-pay | Admitting: Neurology

## 2020-11-30 DIAGNOSIS — G4733 Obstructive sleep apnea (adult) (pediatric): Secondary | ICD-10-CM

## 2020-11-30 NOTE — Telephone Encounter (Signed)
Pt called and stated that she is needing to speak to the RN regarding her cpap machine. Pt states that ever since the pressure was changed on her machine she has been getting headaches and has had Urinary incontinents. Please advise.

## 2020-11-30 NOTE — Telephone Encounter (Signed)
Please call patient back regarding her request to review numbers.  When we reduced her BiPAP pressure from 14/10 centimeters to 13/9 centimeters based on our conversation during our video visit, she has done well using her machine, she is compliant with it, average apnea score is 1.1 which is great.  Her leak is acceptable, her pressure currently is at 13/9 with a backup rate of 12.  I would not recommend any changes to her pressure but if she felt better on 14/10 we can go back up.  Please also advise her that her insurance denied a new machine even though she had an error message on the machine.  We can certainly try to get a peer-to-peer done through her insurance carrier but if she gets approval for a new machine, it may take a while for [place] to get one as there is a Sport and exercise psychologist on new CPAP and BiPAP machines.  Especially on the brand name such as the ResMed models.  If she is agreeable and from my end of things the machine seems to work really well, we will continue on this current machine until her next appointment.  If she would like to go back on the pressure of 14/10, we can certainly do so for her comfort and tolerance level but from the numbers I am looking at in her download, current pressure settings are great as well.

## 2020-11-30 NOTE — Telephone Encounter (Signed)
I called the pt back and we discussed message. She sts since pressure change, she has 4 incontinent episodes/ waking up with h/a.  Pt reports she would prefer to go back to the 14/10 pressure. I have placed VO order and notified [facility] so change can take place.   To note pt has received feedback from [facility] and she is scheduled to start her new BiPAP machine on 12/11/20. F.u appt has been made for 02/19/21.

## 2020-11-30 NOTE — Addendum Note (Signed)
Addended by: Ann Maki on: 11/30/2020 04:04 PM   Modules accepted: Orders

## 2021-02-19 ENCOUNTER — Ambulatory Visit (INDEPENDENT_AMBULATORY_CARE_PROVIDER_SITE_OTHER): Payer: 59 | Admitting: Neurology

## 2021-02-19 ENCOUNTER — Encounter: Payer: Self-pay | Admitting: Neurology

## 2021-02-19 VITALS — BP 122/80 | HR 96 | Ht 72.0 in | Wt 353.0 lb

## 2021-02-19 DIAGNOSIS — G4731 Primary central sleep apnea: Secondary | ICD-10-CM

## 2021-02-19 DIAGNOSIS — G4733 Obstructive sleep apnea (adult) (pediatric): Secondary | ICD-10-CM

## 2021-02-19 NOTE — Patient Instructions (Signed)
It was nice to see you again today.  I am sorry to hear about your father's passing and that your mom has had health scares lately.  You are fully compliant with your BiPAP.  I am glad you were able to get a new machine.  Please continue using your BiPAP regularly. While your insurance requires that you use BiPAP at least 4 hours each night on 70% of the nights, I recommend, that you not skip any nights and use it throughout the night if you can. Getting used to BiPAP and staying with the treatment long term does take time and patience and discipline. Untreated obstructive sleep apnea when it is moderate to severe can have an adverse impact on cardiovascular health and raise her risk for heart disease, arrhythmias, hypertension, congestive heart failure, stroke and diabetes. Untreated obstructive sleep apnea causes sleep disruption, nonrestorative sleep, and sleep deprivation. This can have an impact on your day to day functioning and cause daytime sleepiness and impairment of cognitive function, memory loss, mood disturbance, and problems focussing. Using BiPAP regularly can improve these symptoms.  Please follow-up routinely to see one of our nurse practitioners in 1 year, continue to work on weight loss as you have.

## 2021-02-19 NOTE — Progress Notes (Signed)
Subjective:    Patient ID: Kelly Vargas is a 50 y.o. female.  HPI    Interim history:   Kelly Vargas is a 50 year old right-handed woman with an underlying medical history of polycystic ovarian syndrome, IBS, hypertension, reflux disease, obesity, depression and anxiety, PTSD, status post ventral hernia and umbilical hernia repairs, lumbar laminectomy, tonsillectomy and adenoidectomy, who presents for follow-up consultation of her complex sleep apnea, treated with BiPAP ST. The patient is unaccompanied today and presents for re-check after starting a new machine. I last saw her on in a VV on 10/17/20, at which time she was compliant with treatment. She had seen a bariatric specialist through [facility] and had lost weight. Ozempic caused her to have insomnia and she was started on trazodone low-dose. Her BiPAP machine indicated that the motor life had been exceeded. I prescribed a new machine.  Her set up date was 12/11/2020.  Today, 02/19/2021: I reviewed her BiPAP ST compliance data from 01/17/2021 through 02/14/2021, which is a total of 29 days, during which time she used her machine 29 days with percent use days greater than 4 hours at 100%, indicating superb compliance, average usage of 10 hours and 18 minutes, residual AHI 0.4/h, leak acceptable with a 95th percentile at 9.5 L/min on a pressure of 14/10 centimeters with a backup rate of 12.  She reports doing well with her machine, she did have recent stress, her mom had to have ablation treatment for stomach AVMs which were bleeding.  Her father passed on 2021/02/24 after a prolonged illness with advanced lung disease.  She is holding up okay but sleep is erratic.  She is continuing to work on weight loss, weight has been fluctuating lately.  When she is able to lose a significant amount of weight, she is hoping to get hernia surgery done.  The patient's allergies, current medications, family history, past medical history, past social history,  past surgical history and problem list were reviewed and updated as appropriate.    Previously:     I saw her on 10/28/19 in a virtual visit, at which time she was compliant with her BiPAP ST.  She reported recent stressors, including work-related and personal stressors.  She was advised to follow-up routinely in 1 year.   I reviewed her BiPAP compliance data from 09/17/2020 through 10/16/2020, which is a total of 29 days, during which time she used her machine every night with an average usage of 9 hours and 41 minutes, residual AHI at 1.1/h, leak acceptable with a 95th percentile at 11.5 L/min on a pressure of 14/10 centimeters with a backup rate of 12/min.   I reviewed her BiPAP ST compliance data from 09/27/2019 through 08/24/20, which is a total of 29 days, during which time she used her machine every night, with 100% compliance, average usage of 9 hours and 27 minutes, residual AHI at goal at 0.5/h, leak on the higher side with a 95th percentile at 38.2 L/min on a pressure of 14/10 with a rate of 12.     I saw her on 10/26/2018, at which time she was compliant with her BiPAP.  She had a recent hospitalization for small bowel obstruction.  She had an incarcerated hernia.  She had lost some weight, she needed to lose more weight to pursue hernia surgery.   I saw her on 10/23/2017, at which time she was fully compliant with her BiPAP. Unfortunately, she had a lot of stressors. She was in grad school, she  had family members who were battling cancer. She was supposed to establish care with a new GYN and she had been experiencing foot pain. Her A1c had risen.   I reviewed her BiPAP compliance data from 09/22/2018 through 10/21/2018 which is a total of 30 days, during which time she used her machine every night with percent used days greater than 4 hours at 100%, indicating superb compliance and high average usage of 11 hours and 29 minutes, residual AHI at goal at 0.4 per hour, leak high, albeit better  than before, with the 95th percentile of 36.1 L/m on a pressure of 15/11 with a backup rate of 12.   Of note, she missed an appointment on 08/21/2017. I saw her on 08/21/2016, at which time she was fully compliant with her BiPAP ST. She was doing well overall. She was advised to follow-up routinely in one year. I did suggest we reduced her BiPAP pressure because of high leak. I reduced to 15/11 cm with a rate of 12.   I reviewed her BiPAP compliance data from 09/22/2017 through 10/21/2017 which is a total of 30 days, during which time she used her BiPAP every night with percent used days greater than 4 hours at 100%, indicating superb compliance with an average usage of 8 hours and 26 minutes, residual AHI at goal at 0.5 per hour, leak high with the 95th percentile at 50.8 L/m on a pressure of 15/11 cm with a backup rate of 12.   I saw her on 08/22/2015, at which time she was compliant with BiPAP ST. She had more stress secondary to working full time and she was in online school as well. She had started spironolactone 50 mg once daily and an OCP for her PCOS. She had lost a little bit of weight. She was complaining of urinary urgency and nocturia as well as incontinence at times.   08/21/2016: The reviewed her BiPAP compliance data from 07/20/2016 through 08/18/2016 which is a total of 30 days, during which time she used her BiPAP every night with percent used days greater than 4 hours at 100%, indicating superb compliance with an average usage of 8 hours and 28 minutes, residual AHI low at 0.6 per hour, leak with the 95th percentile at 73 L/m on a pressure of 17/13 with a rate of 12.   I saw her on 02/20/2015, at which time she reported doing fairly well. She was trying to lose weight and had lost about 11 pounds. 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 She was enrolling in the masters program. She was seeking care with a new primary care physician. She was using a small fullface mask. She  was compliant with BiPAP treatment. I encouraged her to continue with treatment and to strive for weight loss.   I reviewed her BiPAP compliance data from 07/22/2015 through 08/20/2015 which is a total of 30 days during which time she used her machine every night with percent used days greater than 4 hours at 97%, indicating excellent compliance with an average usage of 8 hours and 29 minutes, residual AHI low at 0.1 per hour, leak consistently high with the 95th percentile of leak at 65.1 L/m on a pressure of 17/13 with a rate of 12.   I saw her on 10/27/2014, at which time she reported doing fairly well. Unfortunately, leak from the mask was still high. Her lower extremity edema was no better. Her blood pressure values were good. Her nocturia  was much improved from before treatment. I requested a reduction in her pressure to 19/15 cm.   I reviewed her BiPAP compliance data from 01/18/2015 through 02/16/2015 which is a total of 30 days during which time she used her machine every night, percent used days greater than 4 hours was under percent, indicating superb compliance with an average usage of 8 hours and 34 minutes for all nights, residual AHI low at 0.2 per hour but leak still high, albeit improved with the 95th percentile at 63.2 L/m. Pressure at 19/15 with a rate of 12.   I saw her on 07/26/2014, at which time we talked about her sleep test results and her compliance data. She had obstructive sleep apnea which did not respond completely to CPAP therapy and standard BiPAP therapy and she started having central apneas on standard BiPAP. She was therefore switched to BiPAP ST. She had good compliance and indicated good results. She reported sleeping better and improvement of her nocturia as well as improved daytime alertness. Leak was high and I suggested we request a mask refit and reduction in BiPAP pressure 19/15 cm but both things were actually not gone. I have emailed her DME provider today. The  patient states that she was mailed another fullface mask but this was even worse in terms of fit and her mouth would come open. She is trying to get on track with her weight loss.   I reviewed her BiPAP ST compliance data from 09/24/2014 through 10/23/2014 which is a total of 30 days during which time she was under percent compliant. Average usage of 7 hours and 52 minutes with a pressure of 21/17 and the rate of 12. Residual AHI at 1 per hour and leak at times high with the 95th percentile at 75.6 L/m.   I first met her on 05/13/2014 at the request of her psychiatrist, at which time the patient reported difficulty with sleep maintenance with frequent nighttime awakenings, snoring, nocturia, and witnessed apneas. I asked her to return for a sleep study. She had a split-night sleep study on 06/07/2014 and went over her test results with her in detail today. Her baseline sleep efficiency was 62.9% with a latency to sleep of 15.5 minutes and wake after sleep onset of 21 minutes with moderate sleep fragmentation noted. She had an increased percentage of stage I sleep, an increased percentage of slow-wave sleep, and absence of REM sleep. No EKG changes were noted and no significant PLMS. She had loud snoring. She slept on her back only. Her total AHI was 61 per hour. Baseline oxygen saturation was only 88%, nadir was 80%. She was therefore started on CPAP therapy. Sleep efficiency was improved. She had an increased percentage of REM sleep at 39.3%. Average oxygen saturation was 89%, nadir was 78%. The patient was started on CPAP. She was first tried on a nasal mask but had significant mouth opening. She was titrated from 5-15 cm. She had significant residual sleep disordered breathing and therefore was switched to BiPAP therapy. She was titrated from 16/12 cm to 20/16 cm. She had central apneas. She was switched to BiPAP ST. She was titrated to a final pressure of 21/17 with a rate of 12. Residual AHI was 0 on the  final setting with her O2 nadir of 89% on the final setting. Based on her test results I tarted her on BiPAP ST at home.   I reviewed her BiPAP compliance data from 06/21/2014 through 07/20/2014 which is a total  of 30 days during which time she was under percent compliant. Percent used days greater than 4 hours was 100%, indicating superb compliance. Average usage of 8 hours and 17 minutes. Pressure at 21/17 with a rate of 12. Residual AHI 1 per hour, leak was high.   She works from home for [employer]. She has had sleep walking and sleep eating into adulthood. She has tried Nyquil, Xanax, benadryl. She used to get morning HAs, but these improved since she starting using a custom made bite guard for bruxism. She has had nocturia x 2-3 times, but also nocturnal incontinence. She denies RLS symptoms and is not known to kick in her sleep.   Her typical bedtime is reported to be around MN and usual wake time is around 7:30 AM. Sleep onset typically occurs within minutes after reading on her Kindle. She reports feeling poorly rested upon awakening. She wakes up on an average 3 times in the middle of the night and has to go to the bathroom 2-3 times on a typical night.   She reports excessive daytime somnolence (EDS) and Her Epworth Sleepiness Score (ESS) is 6/24 today. She has not fallen asleep while driving. The patient has been taking a nap during lunch time at times.   She has been known to snore for the past many years. Snoring is reportedly marked, and associated with choking sounds and witnessed apneas. The patient admits to a sense of choking or strangling feeling. There is report of nighttime reflux, and she has to sleep on 2 pillows. She prefers to sleep on the R side. There is no nighttime cough experienced. The patient has not noted any RLS symptoms and is not known to kick while asleep or before falling asleep. There is family history of insomnia, but no obvious RLS or OSA.   She is a restless sleeper and in  the morning, the bed is quite disheveled.    She denies cataplexy, sleep paralysis, hypnagogic or hypnopompic hallucinations, or sleep attacks. She does report any vivid dreams, some nightmares, no dream enactments, but parasomnias, such as sleep eating, and sleep walking. The patient has not had a sleep study or a home sleep test.   She consumes 5-6 caffeinated beverages per day, usually in the form of coffee and sodas, as late as dinner. Her bedroom is usually dark and cool. There is no TV in the bedroom.     Her Past Medical History Is Significant For: Past Medical History:  Diagnosis Date   Anxiety    Asthma    Complication of anesthesia    Depression    Diabetes mellitus without complication (HCC)    GERD (gastroesophageal reflux disease)    Hernia    Hypertension    IBS (irritable bowel syndrome)    Migraine    Morbid obesity ([place])    Polycystic ovarian disease    PONV (postoperative nausea and vomiting)    Rosacea    Sleep apnea     Her Past Surgical History Is Significant For: Past Surgical History:  Procedure Laterality Date   ADENOIDECTOMY     BACK SURGERY     DILATATION & CURETTAGE/HYSTEROSCOPY WITH MYOSURE N/A 02/17/2019   Procedure: DILATATION & CURETTAGE/HYSTEROSCOPY WITH MYOSURE;  Surgeon: Janyth Pupa, DO;  Location: [place];  Service: Gynecology;  Laterality: N/A;   HERNIA REPAIR     TONSILECTOMY, ADENOIDECTOMY, BILATERAL MYRINGOTOMY AND TUBES     UMBILICAL HERNIA REPAIR     ventrical hernia repair  Her Family History Is Significant For: Family History  Problem Relation Age of Onset   Heart disease Mother    Cancer Mother    Stroke Mother    Diabetes Mother    Hypertension Mother    Heart disease Father    Hypertension Brother    High Cholesterol Brother    Depression Brother     Her Social History Is Significant For: Social History   Socioeconomic History   Marital status: Legally Separated    Spouse name: [name]   Number of children: 0    Years of education: Programmer, systems.   Highest education level: Not on file  Occupational History    Employer: [employer]  Tobacco Use   Smoking status: Former    Packs/day: 1.00    Years: 20.00    Pack years: 20.00    Types: Cigarettes    Quit date: 09/02/2006    Years since quitting: 14.4   Smokeless tobacco: Never  Vaping Use   Vaping Use: Never used  Substance and Sexual Activity   Alcohol use: Yes    Alcohol/week: 0.0 standard drinks    Comment: occasionally   Drug use: No   Sexual activity: Yes    Partners: Female  Other Topics Concern   Not on file  Social History Narrative   Patient lives at home with spouse.   Caffeine Use: 5-6 cups of coffee daily   Social Determinants of Health   Financial Resource Strain: Not on file  Food Insecurity: Not on file  Transportation Needs: Not on file  Physical Activity: Not on file  Stress: Not on file  Social Connections: Not on file    Her Allergies Are:  Allergies  Allergen Reactions   Chamomile Swelling    Lips and mouth   Levomenol Swelling   Penicillins Hives    Did it involve swelling of the face/tongue/throat, SOB, or low BP? No Did it involve sudden or severe rash/hives, skin peeling, or any reaction on the inside of your mouth or nose? Yes Did you need to seek medical attention at a hospital or doctor's office? No When did it last happen?28 years ago If all above answers are "NO", may proceed with cephalosporin use.    Erythromycin Nausea And Vomiting   Morphine Nausea And Vomiting    Pt states morphine makes me have nausea and vomiting.  :   Her Current Medications Are:  Outpatient Encounter Medications as of 02/19/2021  Medication Sig   acetaminophen (TYLENOL) 500 MG tablet Take 500 mg by mouth every 6 (six) hours as needed for mild pain.    albuterol (VENTOLIN HFA) 108 (90 Base) MCG/ACT inhaler Inhale 1-2 puffs into the lungs every 6 (six) hours as needed for wheezing or shortness of breath.   ALPRAZolam  (XANAX) 0.5 MG tablet Take 0.5 mg by mouth 3 (three) times daily as needed for anxiety.    aspirin EC 81 MG tablet Take 81 mg by mouth daily.   Cholecalciferol (VITAMIN D-3) 125 MCG (5000 UT) TABS Take 5,000 Units by mouth daily.   diphenhydrAMINE (BENADRYL) 25 MG tablet Take 25 mg by mouth at bedtime as needed for sleep (sleep or rash).   DULoxetine (CYMBALTA) 60 MG capsule Take 60 mg by mouth daily.   escitalopram (LEXAPRO) 10 MG tablet Take 10 mg by mouth daily.   famotidine (PEPCID) 20 MG tablet Take 20 mg by mouth every evening.    hydrochlorothiazide (HYDRODIURIL) 25 MG tablet Take 25 mg by mouth  daily.   metFORMIN (GLUCOPHAGE-XR) 500 MG 24 hr tablet Take 500 mg by mouth 2 (two) times a day.   montelukast (SINGULAIR) 10 MG tablet Take 10 mg by mouth daily.   Multiple Vitamin (MULTIVITAMIN WITH MINERALS) TABS tablet Take 1 tablet by mouth daily. Women's One-A-Day Multivitamin   ondansetron (ZOFRAN ODT) 4 MG disintegrating tablet 88m ODT q4 hours prn nausea/vomit   phentermine 15 MG capsule Take 15 mg by mouth every morning.   Semaglutide (OZEMPIC, 2 MG/DOSE, [place]) Inject into the skin. 2 mg weekly   spironolactone (ALDACTONE) 50 MG tablet TAKE 1 TABLET BY MOUTH  DAILY. TAKE WITH BREAKFAST. (Patient taking differently: Take 50 mg by mouth daily.)   Trolamine Salicylate (ASPERCREME EX) Apply 1 application topically 4 (four) times daily as needed (pain.).   vitamin B-12 (CYANOCOBALAMIN) 1000 MCG tablet Take 1,000 mcg by mouth daily.   [DISCONTINUED] hyoscyamine (LEVSIN SL) 0.125 MG SL tablet Place 0.125 mg under the tongue every 4 (four) hours as needed (IBS). (Patient not taking: Reported on 02/19/2021)   [DISCONTINUED] Omega-3 Fatty Acids (FISH OIL) 1000 MG CAPS Take 1,000 mg by mouth daily. (Patient not taking: Reported on 02/19/2021)   No facility-administered encounter medications on file as of 02/19/2021.  :  Review of Systems:  Out of a complete 14 point review of systems, all are  reviewed and negative with the exception of these symptoms as listed below:  Review of Systems  Neurological:        Here for f/u on Bipap. Reports she is doing well. No complaints at this time.    Objective:  Neurological Exam  Physical Exam Physical Examination:   Vitals:   02/19/21 1357  BP: 122/80  Pulse: 96  SpO2: 97%    General Examination: The patient is a very pleasant 50y.o. female in no acute distress. She appears well-developed and well-nourished and well groomed.   HEENT: Normocephalic, atraumatic, pupils are equal, round and reactive to light, extraocular tracking is well-preserved.  Face is symmetric with normal facial animation, speech is clear without dysarthria, hypophonia or voice tremor.  No carotid bruits.  Airway examination reveals mild to moderate mouth dryness, tongue protrudes centrally and palate elevates symmetrically.     Chest: Clear to auscultation without wheezing, rhonchi or crackles noted.   Heart: S1+S2+0, regular and normal without murmurs, rubs or gallops noted.   Abdomen: Soft, non-tender and non-distended, abdominal protrusion from the hernia.   Extremities: There is trace edema.    Skin: Warm and dry. Normal appearing hands and fingers.    Musculoskeletal: exam reveals no obvious joint deformities.   Neurologically: Mental status: The patient is awake, alert and oriented in all 4 spheres. Her immediate and remote memory, attention, language skills and fund of knowledge are appropriate. There is no evidence of aphasia, agnosia, apraxia or anomia. Speech is clear with normal prosody and enunciation. Thought process is linear. Mood is normal and affect is normal. Cranial nerves II - XII are as described above under HEENT exam. Motor exam: Normal bulk, strength and tone is noted. There is no obvious tremor, fine motor skills are grossly intact.   Cerebellar testing: No dysmetria or intention tremor. Sensory exam: intact to light touch in the  upper and lower extremities. Gait, station and balance: She stands easily. No veering to one side is noted. No leaning to one side is noted. Posture is age-appropriate and stance is narrow based. Gait shows normal stride length and normal pace. No problems  turning are noted.   Assessment and Plan:    In summary, IASHA MCCALISTER is a very pleasant 50 year old female with an underlying medical history of polycystic ovarian syndrome, IBS, hypertension, reflux disease, morbid obesity, depression and anxiety, status post ventral hernia and umbilical hernia repairs, lumbar laminectomy, tonsillectomy and adenoidectomy, s/p hospitalization for small bowel obstruction, who presents for follow up consultation of her severe sleep disordered breathing with evidence of severe central apneas with CPAP. She has done well with BiPAP ST. She has been fully compliant with treatment and she is working on weight loss.  In 2020 we reduced her pressure from 15/11 centimeters to 14/10 centimeters and we further reduced her pressure after that but she had difficulty with urinary incontinence and enuresis, we switched her her pressure back to 14/10 cm with a backup rate of 12/m. Of note, she had a split-night sleep study on 06/07/2014. She has a history of severe central apnea while on CPAP. She was tried on BiPAP, but did best on BiPAP ST. Residual AHI is less than 1 per hour. We reduced her pressure to 15/11 in 2017, rate at 12/m. She established treatment with a new BiPAP machine on 12/11/2020.  She is commended for her treatment adherence.  She is advised to follow-up routinely to see one of our nurse practitioners in 1 year.  I answered all her questions today and she was in agreement. I spent 20 minutes in total face-to-face time and in reviewing records during pre-charting, more than 50% of which was spent in counseling and coordination of care, reviewing test results, reviewing medications and treatment regimen and/or in  discussing or reviewing the diagnosis of OSA, the prognosis and treatment options. Pertinent laboratory and imaging test results that were available during this visit with the patient were reviewed by me and considered in my medical decision making (see chart for details).

## 2021-03-19 ENCOUNTER — Encounter (HOSPITAL_BASED_OUTPATIENT_CLINIC_OR_DEPARTMENT_OTHER): Payer: Self-pay

## 2021-03-19 ENCOUNTER — Encounter (HOSPITAL_COMMUNITY)
Admission: EM | Disposition: A | Discharge status: Home / Self Care | Payer: Self-pay | Source: Home / Self Care | Attending: Emergency Medicine

## 2021-03-19 ENCOUNTER — Emergency Department (HOSPITAL_COMMUNITY): Payer: 59 | Admitting: Anesthesiology

## 2021-03-19 ENCOUNTER — Observation Stay (HOSPITAL_BASED_OUTPATIENT_CLINIC_OR_DEPARTMENT_OTHER)
Admission: EM | Admit: 2021-03-19 | Discharge: 2021-03-20 | Disposition: A | Discharge status: Home / Self Care | Payer: 59 | Attending: Emergency Medicine | Admitting: Emergency Medicine

## 2021-03-19 ENCOUNTER — Other Ambulatory Visit: Payer: Self-pay

## 2021-03-19 ENCOUNTER — Emergency Department (HOSPITAL_BASED_OUTPATIENT_CLINIC_OR_DEPARTMENT_OTHER): Payer: 59

## 2021-03-19 ENCOUNTER — Inpatient Hospital Stay: Admit: 2021-03-19 | Payer: 59 | Admitting: Surgery

## 2021-03-19 DIAGNOSIS — I10 Essential (primary) hypertension: Secondary | ICD-10-CM | POA: Diagnosis not present

## 2021-03-19 DIAGNOSIS — E119 Type 2 diabetes mellitus without complications: Secondary | ICD-10-CM | POA: Insufficient documentation

## 2021-03-19 DIAGNOSIS — Z7984 Long term (current) use of oral hypoglycemic drugs: Secondary | ICD-10-CM | POA: Insufficient documentation

## 2021-03-19 DIAGNOSIS — K436 Other and unspecified ventral hernia with obstruction, without gangrene: Secondary | ICD-10-CM

## 2021-03-19 DIAGNOSIS — Z7982 Long term (current) use of aspirin: Secondary | ICD-10-CM | POA: Diagnosis not present

## 2021-03-19 DIAGNOSIS — Z20822 Contact with and (suspected) exposure to covid-19: Secondary | ICD-10-CM | POA: Insufficient documentation

## 2021-03-19 DIAGNOSIS — Z87891 Personal history of nicotine dependence: Secondary | ICD-10-CM | POA: Diagnosis not present

## 2021-03-19 DIAGNOSIS — R1013 Epigastric pain: Secondary | ICD-10-CM | POA: Diagnosis present

## 2021-03-19 DIAGNOSIS — J45909 Unspecified asthma, uncomplicated: Secondary | ICD-10-CM | POA: Insufficient documentation

## 2021-03-19 DIAGNOSIS — K439 Ventral hernia without obstruction or gangrene: Principal | ICD-10-CM | POA: Insufficient documentation

## 2021-03-19 DIAGNOSIS — Z79899 Other long term (current) drug therapy: Secondary | ICD-10-CM | POA: Diagnosis not present

## 2021-03-19 DIAGNOSIS — K56609 Unspecified intestinal obstruction, unspecified as to partial versus complete obstruction: Secondary | ICD-10-CM | POA: Diagnosis present

## 2021-03-19 HISTORY — DX: Unspecified intestinal obstruction, unspecified as to partial versus complete obstruction: K56.609

## 2021-03-19 LAB — COMPREHENSIVE METABOLIC PANEL
ALT: 22 U/L (ref 0–44)
AST: 29 U/L (ref 15–41)
Albumin: 4.7 g/dL (ref 3.5–5.0)
Alkaline Phosphatase: 69 U/L (ref 38–126)
Anion gap: 16 — ABNORMAL HIGH (ref 5–15)
BUN: 13 mg/dL (ref 6–20)
CO2: 21 mmol/L — ABNORMAL LOW (ref 22–32)
Calcium: 9.6 mg/dL (ref 8.9–10.3)
Chloride: 99 mmol/L (ref 98–111)
Creatinine, Ser: 0.76 mg/dL (ref 0.44–1.00)
GFR, Estimated: >60 mL/min (ref >60)
Glucose, Bld: 181 mg/dL — ABNORMAL HIGH (ref 70–99)
Potassium: 3.1 mmol/L — ABNORMAL LOW (ref 3.5–5.1)
Sodium: 136 mmol/L (ref 135–145)
Total Bilirubin: 0.3 mg/dL (ref 0.3–1.2)
Total Protein: 8.4 g/dL — ABNORMAL HIGH (ref 6.5–8.1)

## 2021-03-19 LAB — CBC WITH DIFFERENTIAL/PLATELET
Abs Immature Granulocytes: 0.11 10*3/uL — ABNORMAL HIGH (ref 0.00–0.07)
Basophils Absolute: 0.1 10*3/uL (ref 0.0–0.1)
Basophils Relative: 0 %
Eosinophils Absolute: 0 10*3/uL (ref 0.0–0.5)
Eosinophils Relative: 0 %
HCT: 50.3 % — ABNORMAL HIGH (ref 36.0–46.0)
Hemoglobin: 17 g/dL — ABNORMAL HIGH (ref 12.0–15.0)
Immature Granulocytes: 1 %
Lymphocytes Relative: 10 %
Lymphs Abs: 1.6 10*3/uL (ref 0.7–4.0)
MCH: 30.2 pg (ref 26.0–34.0)
MCHC: 33.8 g/dL (ref 30.0–36.0)
MCV: 89.3 fL (ref 80.0–100.0)
Monocytes Absolute: 0.6 10*3/uL (ref 0.1–1.0)
Monocytes Relative: 4 %
Neutro Abs: 14.3 10*3/uL — ABNORMAL HIGH (ref 1.7–7.7)
Neutrophils Relative %: 85 %
Platelets: 325 10*3/uL (ref 150–400)
RBC: 5.63 MIL/uL — ABNORMAL HIGH (ref 3.87–5.11)
RDW: 12.8 % (ref 11.5–15.5)
WBC: 16.7 10*3/uL — ABNORMAL HIGH (ref 4.0–10.5)
nRBC: 0 % (ref 0.0–0.2)

## 2021-03-19 LAB — CBG MONITORING, ED: Glucose-Capillary: 132 mg/dL — ABNORMAL HIGH (ref 70–99)

## 2021-03-19 LAB — RESP PANEL BY RT-PCR (FLU A&B, COVID) ARPGX2
Influenza A by PCR: NEGATIVE
Influenza B by PCR: NEGATIVE
SARS Coronavirus 2 by RT PCR: NEGATIVE

## 2021-03-19 LAB — LIPASE, BLOOD: Lipase: 29 U/L (ref 11–51)

## 2021-03-19 SURGERY — LAPAROTOMY, EXPLORATORY
Anesthesia: General

## 2021-03-19 MED ORDER — DIPHENHYDRAMINE HCL 25 MG PO CAPS: 25.0000 mg | ORAL_CAPSULE | Freq: Every evening | ORAL | Status: DC | PRN

## 2021-03-19 MED ORDER — VITAMIN D3 25 MCG (1000 UNIT) PO TABS
5000.0000 [IU] | ORAL_TABLET | Freq: Every day | ORAL | Status: DC
  Administered 2021-03-20: 5000 [IU] via ORAL
  Filled 2021-03-19: qty 5

## 2021-03-19 MED ORDER — TRAMADOL HCL 50 MG PO TABS
50.0000 mg | ORAL_TABLET | Freq: Four times a day (QID) | ORAL | Status: DC | PRN
Start: 2021-03-19 — End: 2021-03-20
  Administered 2021-03-19: 50 mg via ORAL
  Filled 2021-03-19: qty 1

## 2021-03-19 MED ORDER — SPIRONOLACTONE 25 MG PO TABS
50.0000 mg | ORAL_TABLET | Freq: Every day | ORAL | Status: DC
  Administered 2021-03-20: 50 mg via ORAL
  Filled 2021-03-19: qty 2

## 2021-03-19 MED ORDER — MORPHINE SULFATE (PF) 4 MG/ML IV SOLN
4.0000 mg | Freq: Once | INTRAVENOUS | Status: AC
Start: 2021-03-19 — End: 2021-03-19
  Administered 2021-03-19: 4 mg via INTRAVENOUS
  Filled 2021-03-19: qty 1

## 2021-03-19 MED ORDER — IBUPROFEN 200 MG PO TABS: 600.0000 mg | ORAL_TABLET | Freq: Four times a day (QID) | ORAL | Status: DC | PRN

## 2021-03-19 MED ORDER — POTASSIUM CHLORIDE CRYS ER 20 MEQ PO TBCR
40.0000 meq | EXTENDED_RELEASE_TABLET | Freq: Once | ORAL | Status: AC
  Administered 2021-03-19: 40 meq via ORAL
  Filled 2021-03-19: qty 2

## 2021-03-19 MED ORDER — IOHEXOL 300 MG/ML  SOLN
100.0000 mL | Freq: Once | INTRAMUSCULAR | Status: AC | PRN
  Administered 2021-03-19: 100 mL via INTRAVENOUS

## 2021-03-19 MED ORDER — ESCITALOPRAM OXALATE 10 MG PO TABS
10.0000 mg | ORAL_TABLET | Freq: Every day | ORAL | Status: DC
  Administered 2021-03-20: 10 mg via ORAL
  Filled 2021-03-19: qty 1

## 2021-03-19 MED ORDER — DULOXETINE HCL 30 MG PO CPEP
60.0000 mg | ORAL_CAPSULE | Freq: Every day | ORAL | Status: DC
  Administered 2021-03-20: 60 mg via ORAL
  Filled 2021-03-19: qty 2

## 2021-03-19 MED ORDER — HYDRALAZINE HCL 20 MG/ML IJ SOLN: 10.0000 mg | INTRAMUSCULAR | Status: DC | PRN

## 2021-03-19 MED ORDER — ACETAMINOPHEN 500 MG PO TABS
1000.0000 mg | ORAL_TABLET | Freq: Four times a day (QID) | ORAL | Status: DC
  Administered 2021-03-19 – 2021-03-20 (×2): 1000 mg via ORAL
  Filled 2021-03-19 (×3): qty 2

## 2021-03-19 MED ORDER — ASPIRIN EC 81 MG PO TBEC
81.0000 mg | DELAYED_RELEASE_TABLET | Freq: Every day | ORAL | Status: DC
  Administered 2021-03-19 – 2021-03-20 (×2): 81 mg via ORAL
  Filled 2021-03-19 (×2): qty 1

## 2021-03-19 MED ORDER — DIPHENHYDRAMINE HCL 12.5 MG/5ML PO ELIX: 12.5000 mg | ORAL_SOLUTION | Freq: Four times a day (QID) | ORAL | Status: DC | PRN

## 2021-03-19 MED ORDER — DOCUSATE SODIUM 100 MG PO CAPS
100.0000 mg | ORAL_CAPSULE | Freq: Two times a day (BID) | ORAL | Status: DC
  Administered 2021-03-19 – 2021-03-20 (×2): 100 mg via ORAL
  Filled 2021-03-19 (×2): qty 1

## 2021-03-19 MED ORDER — FAMOTIDINE 20 MG PO TABS
20.0000 mg | ORAL_TABLET | Freq: Every evening | ORAL | Status: DC
  Administered 2021-03-19: 20 mg via ORAL
  Filled 2021-03-19: qty 1

## 2021-03-19 MED ORDER — VITAMIN B-12 1000 MCG PO TABS
1000.0000 ug | ORAL_TABLET | Freq: Every day | ORAL | Status: DC
  Administered 2021-03-20: 1000 ug via ORAL
  Filled 2021-03-19: qty 1

## 2021-03-19 MED ORDER — ONDANSETRON HCL 4 MG/2ML IJ SOLN
4.0000 mg | Freq: Once | INTRAMUSCULAR | Status: AC
  Administered 2021-03-19: 4 mg via INTRAVENOUS
  Filled 2021-03-19: qty 2

## 2021-03-19 MED ORDER — MORPHINE SULFATE (PF) 4 MG/ML IV SOLN
4.0000 mg | Freq: Once | INTRAVENOUS | Status: AC
  Administered 2021-03-19: 4 mg via INTRAVENOUS
  Filled 2021-03-19: qty 1

## 2021-03-19 MED ORDER — BUPIVACAINE-EPINEPHRINE (PF) 0.25% -1:200000 IJ SOLN
INTRAMUSCULAR | Status: AC
  Filled 2021-03-19: qty 30

## 2021-03-19 MED ORDER — SODIUM CHLORIDE 0.9 % IV SOLN
1000.0000 mL | Freq: Once | INTRAVENOUS | Status: AC
  Administered 2021-03-19: 1000 mL via INTRAVENOUS

## 2021-03-19 MED ORDER — LACTATED RINGERS IV SOLN: INTRAVENOUS | Status: DC

## 2021-03-19 MED ORDER — HYDROCHLOROTHIAZIDE 25 MG PO TABS
25.0000 mg | ORAL_TABLET | Freq: Every day | ORAL | Status: DC
  Administered 2021-03-20: 25 mg via ORAL
  Filled 2021-03-19: qty 1

## 2021-03-19 MED ORDER — PHENTERMINE HCL 15 MG PO CAPS: 15.0000 mg | ORAL_CAPSULE | ORAL | Status: DC

## 2021-03-19 MED ORDER — MONTELUKAST SODIUM 10 MG PO TABS
10.0000 mg | ORAL_TABLET | Freq: Every day | ORAL | Status: DC
  Administered 2021-03-19 – 2021-03-20 (×2): 10 mg via ORAL
  Filled 2021-03-19 (×2): qty 1

## 2021-03-19 MED ORDER — INSULIN ASPART 100 UNIT/ML IJ SOLN
0.0000 [IU] | Freq: Three times a day (TID) | INTRAMUSCULAR | Status: DC
  Administered 2021-03-20: 2 [IU] via SUBCUTANEOUS
  Administered 2021-03-20: 3 [IU] via SUBCUTANEOUS
  Filled 2021-03-19: qty 0.15

## 2021-03-19 MED ORDER — MUSCLE RUB 10-15 % EX CREA
TOPICAL_CREAM | Freq: Four times a day (QID) | CUTANEOUS | Status: DC | PRN
  Filled 2021-03-19: qty 85

## 2021-03-19 MED ORDER — DIPHENHYDRAMINE HCL 50 MG/ML IJ SOLN: 12.5000 mg | Freq: Four times a day (QID) | INTRAMUSCULAR | Status: DC | PRN

## 2021-03-19 MED ORDER — TROLAMINE SALICYLATE 10 % EX CREA: TOPICAL_CREAM | Freq: Four times a day (QID) | CUTANEOUS | Status: DC | PRN

## 2021-03-19 MED ORDER — FENTANYL CITRATE (PF) 100 MCG/2ML IJ SOLN
100.0000 ug | Freq: Once | INTRAMUSCULAR | Status: AC
  Administered 2021-03-19: 100 ug via INTRAVENOUS
  Filled 2021-03-19: qty 2

## 2021-03-19 MED ORDER — SIMETHICONE 80 MG PO CHEW
40.0000 mg | CHEWABLE_TABLET | Freq: Four times a day (QID) | ORAL | Status: DC | PRN
  Filled 2021-03-19: qty 1

## 2021-03-19 MED ORDER — ALPRAZOLAM 0.5 MG PO TABS
0.5000 mg | ORAL_TABLET | Freq: Three times a day (TID) | ORAL | Status: DC | PRN
  Administered 2021-03-19: 0.5 mg via ORAL
  Filled 2021-03-19: qty 1

## 2021-03-19 MED ORDER — TROLAMINE SALICYLATE 10 % EX LOTN: TOPICAL_LOTION | Freq: Four times a day (QID) | CUTANEOUS | Status: DC | PRN

## 2021-03-19 MED ORDER — ALBUTEROL SULFATE HFA 108 (90 BASE) MCG/ACT IN AERS: 1.0000 | INHALATION_SPRAY | Freq: Four times a day (QID) | RESPIRATORY_TRACT | Status: DC | PRN

## 2021-03-19 MED ORDER — ONDANSETRON HCL 4 MG/2ML IJ SOLN
4.0000 mg | Freq: Once | INTRAMUSCULAR | Status: AC
Start: 2021-03-19 — End: 2021-03-19
  Administered 2021-03-19: 4 mg via INTRAVENOUS
  Filled 2021-03-19: qty 2

## 2021-03-19 MED ORDER — ONDANSETRON 4 MG PO TBDP
4.0000 mg | ORAL_TABLET | Freq: Three times a day (TID) | ORAL | Status: DC | PRN
  Administered 2021-03-20: 4 mg via ORAL
  Filled 2021-03-19: qty 1

## 2021-03-19 MED ORDER — INSULIN ASPART 100 UNIT/ML IJ SOLN
0.0000 [IU] | Freq: Every day | INTRAMUSCULAR | Status: DC
  Filled 2021-03-19: qty 0.05

## 2021-03-19 MED ORDER — METFORMIN HCL ER 500 MG PO TB24: 500.0000 mg | ORAL_TABLET | Freq: Two times a day (BID) | ORAL | Status: DC

## 2021-03-19 MED ORDER — ENOXAPARIN SODIUM 40 MG/0.4ML IJ SOSY
40.0000 mg | PREFILLED_SYRINGE | INTRAMUSCULAR | Status: DC
  Administered 2021-03-19: 40 mg via SUBCUTANEOUS
  Filled 2021-03-19: qty 0.4

## 2021-03-19 MED ORDER — ALBUTEROL SULFATE (2.5 MG/3ML) 0.083% IN NEBU: 2.5000 mg | INHALATION_SOLUTION | Freq: Four times a day (QID) | RESPIRATORY_TRACT | Status: DC | PRN

## 2021-03-19 NOTE — ED Notes (Addendum)
Called to ED WR with reports pt passed out-pt sitting in w/c with head slumped to left side-wife beside her-pt had immediate response to pain/pen to nail bed-when asked what happened and change-pt states she now feels dizzy-VSS-pt taken to room 1 via w/c

## 2021-03-19 NOTE — ED Provider Notes (Signed)
[facility] EMERGENCY DEPARTMENT Provider Note   CSN: [id number] Arrival date & time: 03/19/21  1535     History Chief Complaint  Patient presents with   Abdominal Pain    Kelly Vargas is a 50 y.o. female.   Abdominal Pain Associated symptoms: nausea and vomiting   Associated symptoms: no fever    Patient presents with acute onset of epigastric abdominal pain.  She is a hernia she has not been able to reduce.  She has had 6 episodes of vomiting.  Also there is a concern of a moment of confusion, on the phone she was unable to recognize the voice of her wife.  She is oriented to time and place now, but states she feels weaker than normal.  She has been losing weight for an upcoming surgery.  She states that she is currently feeling dizzy as well as nauseated.  The pain is severe.  Past Medical History:  Diagnosis Date   Anxiety    Asthma    Complication of anesthesia    Depression    Diabetes mellitus without complication (HCC)    GERD (gastroesophageal reflux disease)    Hernia    Hypertension    IBS (irritable bowel syndrome)    Migraine    Morbid obesity (HCC)    Polycystic ovarian disease    PONV (postoperative nausea and vomiting)    Rosacea    Sleep apnea    Small bowel obstruction [facility])     Patient Active Problem List   Diagnosis Date Noted   Generalized abdominal pain    Type 2 diabetes mellitus without complication, without long-term current use of insulin (HCC)    Depression    Anxiety    SBO (small bowel obstruction) (HCC) 09/09/2018   Recurrent incisional hernia with incarceration 09/09/2018   PCOS (polycystic ovarian syndrome) 03/02/2015   Morbid obesity with BMI of 50.0-59.9, adult (HCC) 03/02/2015   Hirsutism 03/02/2015   High blood pressure associated with diabetes (HCC) 03/02/2015    Past Surgical History:  Procedure Laterality Date   ADENOIDECTOMY     BACK SURGERY     DILATATION & CURETTAGE/HYSTEROSCOPY WITH MYOSURE N/A  02/17/2019   Procedure: DILATATION & CURETTAGE/HYSTEROSCOPY WITH MYOSURE;  Surgeon: Myna Hidalgo, DO;  Location: [facility] OR;  Service: Gynecology;  Laterality: N/A;   HERNIA REPAIR     TONSILECTOMY, ADENOIDECTOMY, BILATERAL MYRINGOTOMY AND TUBES     UMBILICAL HERNIA REPAIR     ventrical hernia repair       OB History   No obstetric history on file.     Family History  Problem Relation Age of Onset   Heart disease Mother    Cancer Mother    Stroke Mother    Diabetes Mother    Hypertension Mother    Heart disease Father    Hypertension Brother    High Cholesterol Brother    Depression Brother     Social History   Tobacco Use   Smoking status: Former    Packs/day: 1.00    Years: 20.00    Pack years: 20.00    Types: Cigarettes    Quit date: 09/02/2006    Years since quitting: 14.5   Smokeless tobacco: Never  Vaping Use   Vaping Use: Never used  Substance Use Topics   Alcohol use: Not Currently   Drug use: No    Home Medications Prior to Admission medications   Medication Sig Start Date End Date Taking? Authorizing Provider  acetaminophen (  TYLENOL) 500 MG tablet Take 500 mg by mouth every 6 (six) hours as needed for mild pain.     [provider]  albuterol (VENTOLIN HFA) 108 (90 Base) MCG/ACT inhaler Inhale 1-2 puffs into the lungs every 6 (six) hours as needed for wheezing or shortness of breath.    [provider]  ALPRAZolam Prudy Feeler) 0.5 MG tablet Take 0.5 mg by mouth 3 (three) times daily as needed for anxiety.     [provider]  aspirin EC 81 MG tablet Take 81 mg by mouth daily.    [provider]  Cholecalciferol (VITAMIN D-3) 125 MCG (5000 UT) TABS Take 5,000 Units by mouth daily.    [provider]  diphenhydrAMINE (BENADRYL) 25 MG tablet Take 25 mg by mouth at bedtime as needed for sleep (sleep or rash).    [provider]  DULoxetine (CYMBALTA) 60 MG capsule Take 60 mg by mouth daily.    [provider]  escitalopram (LEXAPRO) 10 MG tablet Take 10 mg by mouth daily.    [provider]  famotidine (PEPCID) 20 MG tablet Take 20 mg by mouth every evening.     [provider]  hydrochlorothiazide (HYDRODIURIL) 25 MG tablet Take 25 mg by mouth daily.    [provider]  metFORMIN (GLUCOPHAGE-XR) 500 MG 24 hr tablet Take 500 mg by mouth 2 (two) times a day.    [provider]  montelukast (SINGULAIR) 10 MG tablet Take 10 mg by mouth daily.    [provider]  Multiple Vitamin (MULTIVITAMIN WITH MINERALS) TABS tablet Take 1 tablet by mouth daily. Women's One-A-Day Multivitamin    [provider]  ondansetron (ZOFRAN ODT) 4 MG disintegrating tablet 4mg  ODT q4 hours prn nausea/vomit 11/02/19   01/02/20, PA-C  phentermine 15 MG capsule Take 15 mg by mouth every morning.    [provider]  Semaglutide (OZEMPIC, 2 MG/DOSE, [place]) Inject into the skin. 2 mg weekly    [provider]  spironolactone (ALDACTONE) 50 MG tablet TAKE 1 TABLET BY MOUTH  DAILY. TAKE WITH BREAKFAST. Patient taking differently: Take 50 mg by mouth daily. 07/04/17   13/2/18 A, CNM  Trolamine Salicylate (ASPERCREME EX) Apply 1 application topically 4 (four) times daily as needed (pain.).    [provider]  vitamin B-12 (CYANOCOBALAMIN) 1000 MCG tablet Take 1,000 mcg by mouth daily.    [provider]    Allergies    Chamomile, Levomenol, Penicillins, Erythromycin, and Morphine  Review of Systems   Review of Systems  Constitutional:  Negative for fever.  Gastrointestinal:  Positive for abdominal pain, nausea and vomiting.  Psychiatric/Behavioral:  Positive for confusion.    Physical Exam Updated Vital Signs BP 100/81   Pulse 81   Temp (!) 97 F (36.1 C) (Tympanic) Comment: would not register oral x 3 attempts-pt states "i've been mouth breathing"  Resp 18   Ht 6' (1.829 m)   Wt (!) 158.8 kg   LMP 01/12/2016   SpO2 98%    BMI 47.47 kg/m   Physical Exam Vitals and nursing note reviewed. Exam conducted with a chaperone present.  Constitutional:      General: She is in acute distress.     Appearance: Normal appearance. She is obese.     Comments: Patient is distressed, obviously in pain.  Able to answer questions appropriately and follow commands.  HENT:     Head: Normocephalic and atraumatic.  Eyes:  General: No scleral icterus.       Right eye: No discharge.        Left eye: No discharge.     Extraocular Movements: Extraocular movements intact.     Pupils: Pupils are equal, round, and reactive to light.  Cardiovascular:     Rate and Rhythm: Normal rate and regular rhythm.     Pulses: Normal pulses.     Heart sounds: Normal heart sounds. No murmur heard.   No friction rub. No gallop.  Pulmonary:     Effort: Pulmonary effort is normal. No respiratory distress.     Breath sounds: Normal breath sounds.  Abdominal:     General: Abdomen is flat. Bowel sounds are normal. There is no distension.     Palpations: Abdomen is soft.     Tenderness: There is abdominal tenderness. There is guarding. There is no rebound.     Hernia: A hernia is present. Hernia is present in the ventral area.     Comments: Patient has a ventral hernia that is tender to palpation.  I am unable to reduce it.  Skin:    General: Skin is warm and dry.     Coloration: Skin is not jaundiced.  Neurological:     Mental Status: She is alert. Mental status is at baseline.     Coordination: Coordination normal.     Comments: Cranial nerves III through XII are grossly intact.  Grip strength is equal bilaterally.    ED Results / Procedures / Treatments   Labs (all labs ordered are listed, but only abnormal results are displayed) Labs Reviewed - No data to display  EKG None  Radiology No results found.  Procedures Procedures   Medications Ordered in ED Medications - No data to display  ED Course  I have reviewed the triage  vital signs and the nursing notes.  Pertinent labs & imaging results that were available during my care of the patient were reviewed by me and considered in my medical decision making (see chart for details).  Clinical Course as of 03/19/21 Halford Chessman Mar 19, 2021  1716 Lipase, blood Doubt pancreatitis [HS]  1716 CBC with Differential(!) Patient is a leukocytosis concerning for possible infectious or inflammatory etiology.  CT scan is already ordered, will evaluate when read. [HS]  1716 Comprehensive metabolic panel(!) No significant electrolyte derangement. [HS]    Clinical Course User Index [HS] Theron Arista, PA-C   MDM Rules/Calculators/A&P                          Patient is not in distress, although her vitals appear stable.  I am unable to reduce the ventral hernia.  There is also concerned about possible bowel obstruction given she has a history of it.  I will order labs, pain medicine, Zofran.  We will also start with an x-ray to assess for small bowel obstruction with as well as a CT to assess for incarcerated hernia.  CT scan shows evidence of  closed loop jejunal obstruction and possible small bowel obstruction vs. Hernia incarceration. Will consult surgery.   Spoke with Dr. Frederik Pear with general surgery.  He request we put an NG tube in the patient and transfer [facility] to [facility].  Spoke with Dr. Madilyn Hook at [facility] who was agreed to accept the patient.  We will arrange for [facility] transfer service to ER to ER.  Final Clinical Impression(s) / ED Diagnoses Final diagnoses:  None    Rx / DC Orders ED Discharge Orders     None        Theron AristaSage, Retaj Hilbun, [place]PA-C 03/19/21 1840    Cathren LaineSteinl, Kevin, MD 03/21/21 1032

## 2021-03-19 NOTE — Anesthesia Preprocedure Evaluation (Deleted)
Anesthesia Evaluation  Patient identified by MRN, date of birth, ID band Patient awake    Reviewed: Allergy & Precautions, NPO status , Patient's Chart, lab work & pertinent test results  History of Anesthesia Complications (+) PONV  Airway Mallampati: II  TM Distance: <3 FB Neck ROM: Full    Dental no notable dental hx.    Pulmonary sleep apnea , former smoker,    Pulmonary exam normal breath sounds clear to auscultation       Cardiovascular hypertension, Normal cardiovascular exam Rhythm:Regular Rate:Normal     Neuro/Psych negative neurological ROS  negative psych ROS   GI/Hepatic Neg liver ROS, GERD  ,  Endo/Other  diabetesMorbid obesityPolycystic ovarian disease  Renal/GU negative Renal ROS  negative genitourinary   Musculoskeletal negative musculoskeletal ROS (+)   Abdominal (+) + obese,   Peds negative pediatric ROS (+)  Hematology negative hematology ROS (+)   Anesthesia Other Findings   Reproductive/Obstetrics negative OB ROS                             Anesthesia Physical Anesthesia Plan  ASA: 3  Anesthesia Plan: General   Post-op Pain Management:    Induction: Intravenous and Rapid sequence  PONV Risk Score and Plan: 4 or greater and Ondansetron, Dexamethasone, Midazolam and Treatment may vary due to age or medical condition  Airway Management Planned: Oral ETT and Video Laryngoscope Planned  Additional Equipment:   Intra-op Plan:   Post-operative Plan: Extubation in OR  Informed Consent: I have reviewed the patients History and Physical, chart, labs and discussed the procedure including the risks, benefits and alternatives for the proposed anesthesia with the patient or authorized representative who has indicated his/her understanding and acceptance.     Dental advisory given  Plan Discussed with: CRNA and Surgeon  Anesthesia Plan Comments:          Anesthesia Quick Evaluation

## 2021-03-19 NOTE — ED Provider Notes (Signed)
Pt transferred from [facility] for bowel obstruction - general surgery to see.    Pt seen by Dr. Cliffton Asters and hernia reduced at bedside.  Plan to admit for further observation.     Tilden Fossa, MD 03/19/21 [phone]

## 2021-03-19 NOTE — H&P (Signed)
CC: Incarcerated recurrent incisional hernia  Requesting provider: Theron Arista, PA-C  HPI: Kelly Vargas is an 50 y.o. female with hx of HTN, DM, GERD, HLD, PCOS, whom reports a history of 2 prior hernia repairs with mesh-1 being remote around her umbilicus and a subsequent in 2012 with Dr. Lindie Spruce for an incisional/paramedian ventral hernia with mesh.  She developed a recurrence.  She has been dealing with this for many years.  She reports she is currently seeing [facility] and attempt to lose weight with diet/exercise.  She reports that with the assistance of some medication and eating a better diet she is lost approximately 75 pounds intentionally in the last 6 months.  She has had a large incisional hernia that she has known about for many years and works to keep it in a reduced type position in the evening.  She is generally able to at least partially reduce it until it is soft.  For the last day she has been unable to reduce any of it and it has become hard and uncomfortable.  She presented to med [facility] for further evaluation.  She reports that attempted bedside reduction there were unsuccessful.  She was transferred to [facility] long for further evaluation  Past Medical History:  Diagnosis Date   Anxiety    Asthma    Complication of anesthesia    Depression    Diabetes mellitus without complication (HCC)    GERD (gastroesophageal reflux disease)    Hernia    Hypertension    IBS (irritable bowel syndrome)    Migraine    Morbid obesity (HCC)    Polycystic ovarian disease    PONV (postoperative nausea and vomiting)    Rosacea    Sleep apnea    Small bowel obstruction (HCC)     Past Surgical History:  Procedure Laterality Date   ADENOIDECTOMY     BACK SURGERY     DILATATION & CURETTAGE/HYSTEROSCOPY WITH MYOSURE N/A 02/17/2019   Procedure: DILATATION & CURETTAGE/HYSTEROSCOPY WITH MYOSURE;  Surgeon: Myna Hidalgo, DO;  Location: [facility] OR;  Service: Gynecology;   Laterality: N/A;   HERNIA REPAIR     TONSILECTOMY, ADENOIDECTOMY, BILATERAL MYRINGOTOMY AND TUBES     UMBILICAL HERNIA REPAIR     ventrical hernia repair      Family History  Problem Relation Age of Onset   Heart disease Mother    Cancer Mother    Stroke Mother    Diabetes Mother    Hypertension Mother    Heart disease Father    Hypertension Brother    High Cholesterol Brother    Depression Brother     Social:  reports that she quit smoking about 14 years ago. Her smoking use included cigarettes. She has a 20.00 pack-year smoking history. She has never used smokeless tobacco. She reports previous alcohol use. She reports that she does not use drugs.  Allergies:  Allergies  Allergen Reactions   Chamomile Swelling    Lips and mouth   Levomenol Swelling   Penicillins Hives    Did it involve swelling of the face/tongue/throat, SOB, or low BP? No Did it involve sudden or severe rash/hives, skin peeling, or any reaction on the inside of your mouth or nose? Yes Did you need to seek medical attention at a hospital or doctor's office? No When did it last happen?28 years ago If all above answers are "NO", may proceed with cephalosporin use.    Erythromycin Nausea And Vomiting   Morphine  Nausea And Vomiting    Pt states morphine makes me have nausea and vomiting.    Medications: I have reviewed the patient's current medications.  Results for orders placed or performed during the hospital encounter of 03/19/21 (from the past 48 hour(s))  CBC with Differential     Status: Abnormal   Collection Time: 03/19/21  4:15 PM  Result Value Ref Range   WBC 16.7 (H) 4.0 - 10.5 K/uL   RBC 5.63 (H) 3.87 - 5.11 MIL/uL   Hemoglobin 17.0 (H) 12.0 - 15.0 g/dL   HCT 86.5 (H) 78.4 - 69.6 %   MCV 89.3 80.0 - 100.0 fL   MCH 30.2 26.0 - 34.0 pg   MCHC 33.8 30.0 - 36.0 g/dL   RDW 29.5 28.4 - 13.2 %   Platelets 325 150 - 400 K/uL   nRBC 0.0 0.0 - 0.2 %   Neutrophils Relative % 85 %   Neutro Abs  14.3 (H) 1.7 - 7.7 K/uL   Lymphocytes Relative 10 %   Lymphs Abs 1.6 0.7 - 4.0 K/uL   Monocytes Relative 4 %   Monocytes Absolute 0.6 0.1 - 1.0 K/uL   Eosinophils Relative 0 %   Eosinophils Absolute 0.0 0.0 - 0.5 K/uL   Basophils Relative 0 %   Basophils Absolute 0.1 0.0 - 0.1 K/uL   Immature Granulocytes 1 %   Abs Immature Granulocytes 0.11 (H) 0.00 - 0.07 K/uL    Comment: Performed at [facility], [address] [place], [place] [address]  Comprehensive metabolic panel     Status: Abnormal   Collection Time: 03/19/21  4:15 PM  Result Value Ref Range   Sodium 136 135 - 145 mmol/L   Potassium 3.1 (L) 3.5 - 5.1 mmol/L   Chloride 99 98 - 111 mmol/L   CO2 21 (L) 22 - 32 mmol/L   Glucose, Bld 181 (H) 70 - 99 mg/dL    Comment: Glucose reference range applies only to samples taken after fasting for at least 8 hours.   BUN 13 6 - 20 mg/dL   Creatinine, Ser 2.72 0.44 - 1.00 mg/dL   Calcium 9.6 8.9 - 53.6 mg/dL   Total Protein 8.4 (H) 6.5 - 8.1 g/dL   Albumin 4.7 3.5 - 5.0 g/dL   AST 29 15 - 41 U/L   ALT 22 0 - 44 U/L   Alkaline Phosphatase 69 38 - 126 U/L   Total Bilirubin 0.3 0.3 - 1.2 mg/dL   GFR, Estimated >64 >40 mL/min    Comment: (NOTE) Calculated using the CKD-EPI Creatinine Equation (2021)    Anion gap 16 (H) 5 - 15    Comment: Performed at [facility], [address] [place], [place] [address]  Lipase, blood     Status: None   Collection Time: 03/19/21  4:15 PM  Result Value Ref Range   Lipase 29 11 - 51 U/L    Comment: Performed at [facility], [address] [place], [place] [address]  Resp Panel by RT-PCR (Flu A&B, Covid) Nasopharyngeal Swab     Status: None   Collection Time: 03/19/21  6:18 PM   Specimen: Nasopharyngeal Swab; Nasopharyngeal(NP) swabs in vial transport medium  Result Value Ref Range   SARS Coronavirus 2 by RT PCR NEGATIVE NEGATIVE    Comment: (NOTE) SARS-CoV-2 target nucleic acids are NOT DETECTED.  The  SARS-CoV-2 RNA is generally detectable in upper respiratory specimens during the acute phase  of infection. The lowest concentration of SARS-CoV-2 viral copies this assay can detect is 138 copies/mL. A negative result does not preclude SARS-Cov-2 infection and should not be used as the sole basis for treatment or other patient management decisions. A negative result may occur with  improper specimen collection/handling, submission of specimen other than nasopharyngeal swab, presence of viral mutation(s) within the areas targeted by this assay, and inadequate number of viral copies(<138 copies/mL). A negative result must be combined with clinical observations, patient history, and epidemiological information. The expected result is Negative.  Fact Sheet for Patients:  BloggerCourse.com  Fact Sheet for Healthcare Providers:  SeriousBroker.it  This test is no t yet approved or cleared by the [place] FDA and  has been authorized for detection and/or diagnosis of SARS-CoV-2 by FDA under an Emergency Use Authorization (EUA). This EUA will remain  in effect (meaning this test can be used) for the duration of the COVID-19 declaration under Section 564(b)(1) of the Act, 21 U.S.C.section 360bbb-3(b)(1), unless the authorization is terminated  or revoked sooner.       Influenza A by PCR NEGATIVE NEGATIVE   Influenza B by PCR NEGATIVE NEGATIVE    Comment: (NOTE) The Xpert Xpress SARS-CoV-2/FLU/RSV plus assay is intended as an aid in the diagnosis of influenza from Nasopharyngeal swab specimens and should not be used as a sole basis for treatment. Nasal washings and aspirates are unacceptable for Xpert Xpress SARS-CoV-2/FLU/RSV testing.  Fact Sheet for Patients: BloggerCourse.com  Fact Sheet for Healthcare Providers: SeriousBroker.it  This test is not yet approved or cleared by the  [place] FDA and has been authorized for detection and/or diagnosis of SARS-CoV-2 by FDA under an Emergency Use Authorization (EUA). This EUA will remain in effect (meaning this test can be used) for the duration of the COVID-19 declaration under Section 564(b)(1) of the Act, 21 U.S.C. section 360bbb-3(b)(1), unless the authorization is terminated or revoked.  Performed at [facility], [address] [place], [place] [address]     CT Abdomen Pelvis W Contrast  Result Date: 03/19/2021 CLINICAL DATA:  Generalized and right-sided abdominal pain since this morning EXAM: CT ABDOMEN AND PELVIS WITH CONTRAST TECHNIQUE: Multidetector CT imaging of the abdomen and pelvis was performed using the standard protocol following bolus administration of intravenous contrast. CONTRAST:  OMNIPAQUE IOHEXOL 300 MG/ML  SOLN COMPARISON:  11/02/2019 FINDINGS: Lower chest: No acute pleural or parenchymal lung disease. Hepatobiliary: Diffuse hepatic steatosis unchanged. No focal liver abnormalities. Gallbladder is unremarkable. No biliary duct dilation. Pancreas: Unremarkable. No pancreatic ductal dilatation or surrounding inflammatory changes. Spleen: Normal in size without focal abnormality. Adrenals/Urinary Tract: Stable lobular contour of the kidneys likely congenital. Otherwise the kidneys enhance normally and symmetrically. No urinary tract calculi or obstructive uropathy. The adrenals and bladder are unremarkable. Stomach/Bowel: There is a large midline ventral hernia again noted. Since the previous exam, a closed loop small-bowel obstruction has developed, with transition points along the inferior margin of the hernia sac reference image 74/3 and at the left lateral margin of the hernia sac reference image 65/3. There is distension of the intervening loop of jejunum measuring up to 3.5 cm, with scattered gas fluid levels. Mild jejunal wall thickening is also noted, with surrounding mesenteric  stranding. Trace amount of free fluid within the hernia sac. No evidence of pneumatosis. Diverticulosis of the descending and sigmoid colon again noted without diverticulitis. Vascular/Lymphatic: Aortic atherosclerosis. No enlarged abdominal or pelvic lymph nodes. Reproductive: Uterus and bilateral adnexa  are unremarkable. Other: There is trace free fluid within the pelvis. No free intraperitoneal gas. As described above, there is a large ventral hernia again identified with abdominal wall defect measuring 15 x 9 cm. Herniation of large and small bowel through the hernia defect, with developing closed loop jejunal obstruction as described above. Extensive mesenteric edema and trace free fluid within the hernia sac may be related to the closed loop obstruction or due to hernia incarceration. Musculoskeletal: No acute or destructive bony lesions. Reconstructed images demonstrate no additional findings. IMPRESSION: 1. Closed loop jejunal obstruction, with transition points located along the left lateral and inferior margins of the large ventral hernia seen previously. Distension of the jejunal loop, with scattered gas fluid levels and mild jejunal wall thickening. No evidence of pneumatosis at this time. 2. Significant fat stranding within the mesentery adjacent to the closed loop jejunal obstruction, as well as throughout the hernia sac. Trace fluid within the hernia sac as well. Differential includes reactive changes from small-bowel obstruction versus hernia incarceration. Surgical consultation recommended. 3. Sigmoid diverticulosis without diverticulitis. 4. Hepatic steatosis. 5.  Aortic Atherosclerosis (ICD10-I70.0). Critical Value/emergent results were called by telephone at the time of interpretation on 03/19/2021 at 6:07 pm to provider DR Denton Lank, who verbally acknowledged these results. Electronically Signed   By: Sharlet Salina M.D.   On: 03/19/2021 18:09    ROS - all of the below systems have been reviewed  with the patient and positives are indicated with bold text General: chills, fever or night sweats Eyes: blurry vision or double vision ENT: epistaxis or sore throat Allergy/Immunology: itchy/watery eyes or nasal congestion Hematologic/Lymphatic: bleeding problems, blood clots or swollen lymph nodes Endocrine: temperature intolerance or unexpected weight changes Breast: new or changing breast lumps or nipple discharge Resp: cough, shortness of breath, or wheezing CV: chest pain or dyspnea on exertion GI: as per HPI GU: dysuria, trouble voiding, or hematuria MSK: joint pain or joint stiffness Neuro: TIA or stroke symptoms Derm: pruritus and skin lesion changes Psych: anxiety and depression  PE Blood pressure 135/77, pulse 94, temperature (!) 97 F (36.1 C), temperature source Tympanic, resp. rate (!) 24, height 6' (1.829 m), weight (!) 158.8 kg, last menstrual period 01/12/2016, SpO2 92 %. Constitutional: NAD; conversant; no deformities Eyes: Moist conjunctiva; no lid lag; anicteric; PERRL Neck: Trachea midline; no thyromegaly Lungs: Normal respiratory effort; no tactile fremitus CV: RRR; no palpable thrills; no pitting edema GI: Abd obese, soft; 2 ventral hernias.  The one most medial near the umbilicus is firm.  With gentle steady pressure, this is able to be reduced without a lot of difficulty.  The much larger more lateral ventral hernia is gently manipulated and able to be at least partially reduced.  This is clearly not incarcerated at this point as it is soft and freely moves through the fascia.  No overlying skin changes. No clearly palpable hepatosplenomegaly MSK: Normal range of motion of extremities; no clubbing/cyanosis Psychiatric: Appropriate affect; alert and oriented x3 Lymphatic: No palpable cervical or axillary lymphadenopathy  Results for orders placed or performed during the hospital encounter of 03/19/21 (from the past 48 hour(s))  CBC with Differential     Status:  Abnormal   Collection Time: 03/19/21  4:15 PM  Result Value Ref Range   WBC 16.7 (H) 4.0 - 10.5 K/uL   RBC 5.63 (H) 3.87 - 5.11 MIL/uL   Hemoglobin 17.0 (H) 12.0 - 15.0 g/dL   HCT 86.5 (H) 78.4 - 69.6 %  MCV 89.3 80.0 - 100.0 fL   MCH 30.2 26.0 - 34.0 pg   MCHC 33.8 30.0 - 36.0 g/dL   RDW 16.1 09.6 - 04.5 %   Platelets 325 150 - 400 K/uL   nRBC 0.0 0.0 - 0.2 %   Neutrophils Relative % 85 %   Neutro Abs 14.3 (H) 1.7 - 7.7 K/uL   Lymphocytes Relative 10 %   Lymphs Abs 1.6 0.7 - 4.0 K/uL   Monocytes Relative 4 %   Monocytes Absolute 0.6 0.1 - 1.0 K/uL   Eosinophils Relative 0 %   Eosinophils Absolute 0.0 0.0 - 0.5 K/uL   Basophils Relative 0 %   Basophils Absolute 0.1 0.0 - 0.1 K/uL   Immature Granulocytes 1 %   Abs Immature Granulocytes 0.11 (H) 0.00 - 0.07 K/uL    Comment: Performed at [facility], [address] [place], [place] [address]  Comprehensive metabolic panel     Status: Abnormal   Collection Time: 03/19/21  4:15 PM  Result Value Ref Range   Sodium 136 135 - 145 mmol/L   Potassium 3.1 (L) 3.5 - 5.1 mmol/L   Chloride 99 98 - 111 mmol/L   CO2 21 (L) 22 - 32 mmol/L   Glucose, Bld 181 (H) 70 - 99 mg/dL    Comment: Glucose reference range applies only to samples taken after fasting for at least 8 hours.   BUN 13 6 - 20 mg/dL   Creatinine, Ser 1.91 0.44 - 1.00 mg/dL   Calcium 9.6 8.9 - 47.8 mg/dL   Total Protein 8.4 (H) 6.5 - 8.1 g/dL   Albumin 4.7 3.5 - 5.0 g/dL   AST 29 15 - 41 U/L   ALT 22 0 - 44 U/L   Alkaline Phosphatase 69 38 - 126 U/L   Total Bilirubin 0.3 0.3 - 1.2 mg/dL   GFR, Estimated >29 >56 mL/min    Comment: (NOTE) Calculated using the CKD-EPI Creatinine Equation (2021)    Anion gap 16 (H) 5 - 15    Comment: Performed at [facility], [address] [place], [place] [address]  Lipase, blood     Status: None   Collection Time: 03/19/21  4:15 PM  Result Value Ref Range   Lipase 29 11 - 51 U/L    Comment: Performed at [facility], [address] [place], [place] [address]  Resp Panel by RT-PCR (Flu A&B, Covid) Nasopharyngeal Swab     Status: None   Collection Time: 03/19/21  6:18 PM   Specimen: Nasopharyngeal Swab; Nasopharyngeal(NP) swabs in vial transport medium  Result Value Ref Range   SARS Coronavirus 2 by RT PCR NEGATIVE NEGATIVE    Comment: (NOTE) SARS-CoV-2 target nucleic acids are NOT DETECTED.  The SARS-CoV-2 RNA is generally detectable in upper respiratory specimens during the acute phase of infection. The lowest concentration of SARS-CoV-2 viral copies this assay can detect is 138 copies/mL. A negative result does not preclude SARS-Cov-2 infection and should not be used as the sole basis for treatment or other patient management decisions. A negative result may occur with  improper specimen collection/handling, submission of specimen other than nasopharyngeal swab, presence of viral mutation(s) within the areas targeted by this assay, and inadequate number of viral copies(<138 copies/mL). A negative result must be combined with clinical observations, patient history, and epidemiological information. The expected result is Negative.  Fact Sheet for Patients:  BloggerCourse.com  Fact Sheet for Healthcare  Providers:  SeriousBroker.it  This test is no t yet approved or cleared by the [place] and  has been authorized for detection and/or diagnosis of SARS-CoV-2 by FDA under an Emergency Use Authorization (EUA). This EUA will remain  in effect (meaning this test can be used) for the duration of the COVID-19 declaration under Section 564(b)(1) of the Act, 21 U.S.C.section 360bbb-3(b)(1), unless the authorization is terminated  or revoked sooner.       Influenza A by PCR NEGATIVE NEGATIVE   Influenza B by PCR NEGATIVE NEGATIVE    Comment: (NOTE) The Xpert Xpress SARS-CoV-2/FLU/RSV plus assay is intended as an aid in  the diagnosis of influenza from Nasopharyngeal swab specimens and should not be used as a sole basis for treatment. Nasal washings and aspirates are unacceptable for Xpert Xpress SARS-CoV-2/FLU/RSV testing.  Fact Sheet for Patients: BloggerCourse.com  Fact Sheet for Healthcare Providers: SeriousBroker.it  This test is not yet approved or cleared by the [place] FDA and has been authorized for detection and/or diagnosis of SARS-CoV-2 by FDA under an Emergency Use Authorization (EUA). This EUA will remain in effect (meaning this test can be used) for the duration of the COVID-19 declaration under Section 564(b)(1) of the Act, 21 U.S.C. section 360bbb-3(b)(1), unless the authorization is terminated or revoked.  Performed at [facility] [address] [place], [place] [address]     CT Abdomen Pelvis W Contrast  Result Date: 03/19/2021 CLINICAL DATA:  Generalized and right-sided abdominal pain since this morning EXAM: CT ABDOMEN AND PELVIS WITH CONTRAST TECHNIQUE: Multidetector CT imaging of the abdomen and pelvis was performed using the standard protocol following bolus administration of intravenous contrast. CONTRAST:  OMNIPAQUE IOHEXOL 300 MG/ML  SOLN COMPARISON:  11/02/2019 FINDINGS: Lower chest: No acute pleural or parenchymal lung disease. Hepatobiliary: Diffuse hepatic steatosis unchanged. No focal liver abnormalities. Gallbladder is unremarkable. No biliary duct dilation. Pancreas: Unremarkable. No pancreatic ductal dilatation or surrounding inflammatory changes. Spleen: Normal in size without focal abnormality. Adrenals/Urinary Tract: Stable lobular contour of the kidneys likely congenital. Otherwise the kidneys enhance normally and symmetrically. No urinary tract calculi or obstructive uropathy. The adrenals and bladder are unremarkable. Stomach/Bowel: There is a large midline ventral hernia again noted. Since  the previous exam, a closed loop small-bowel obstruction has developed, with transition points along the inferior margin of the hernia sac reference image 74/3 and at the left lateral margin of the hernia sac reference image 65/3. There is distension of the intervening loop of jejunum measuring up to 3.5 cm, with scattered gas fluid levels. Mild jejunal wall thickening is also noted, with surrounding mesenteric stranding. Trace amount of free fluid within the hernia sac. No evidence of pneumatosis. Diverticulosis of the descending and sigmoid colon again noted without diverticulitis. Vascular/Lymphatic: Aortic atherosclerosis. No enlarged abdominal or pelvic lymph nodes. Reproductive: Uterus and bilateral adnexa are unremarkable. Other: There is trace free fluid within the pelvis. No free intraperitoneal gas. As described above, there is a large ventral hernia again identified with abdominal wall defect measuring 15 x 9 cm. Herniation of large and small bowel through the hernia defect, with developing closed loop jejunal obstruction as described above. Extensive mesenteric edema and trace free fluid within the hernia sac may be related to the closed loop obstruction or due to hernia incarceration. Musculoskeletal: No acute or destructive bony lesions. Reconstructed images demonstrate no additional findings. IMPRESSION: 1. Closed loop jejunal obstruction, with transition points located along the left lateral and  inferior margins of the large ventral hernia seen previously. Distension of the jejunal loop, with scattered gas fluid levels and mild jejunal wall thickening. No evidence of pneumatosis at this time. 2. Significant fat stranding within the mesentery adjacent to the closed loop jejunal obstruction, as well as throughout the hernia sac. Trace fluid within the hernia sac as well. Differential includes reactive changes from small-bowel obstruction versus hernia incarceration. Surgical consultation recommended.  3. Sigmoid diverticulosis without diverticulitis. 4. Hepatic steatosis. 5.  Aortic Atherosclerosis (ICD10-I70.0). Critical Value/emergent results were called by telephone at the time of interpretation on 03/19/2021 at 6:07 pm to provider DR Denton LankSTEINL, who verbally acknowledged these results. Electronically Signed   By: Sharlet SalinaMichael  Brown M.D.   On: 03/19/2021 18:09     A/P: Franki MonteKristy M Vargas is an 50 y.o. female with HTN, DM, GERD, HLD, PCOS with what was an incarcerated small bowel containing recurrent incisional hernia  -We are able to successfully reduce her hernia.  It is no longer incarcerated and she feels much better.  She reports that her hernia currently feels much like it usually does with regards to its size and softness.  She denies any pain whatsoever at this point. -We will plan to bring her in at least overnight for observation to ensure she is feeling better and to ensure there is no evidence of compromised bowel -Diabetic liquids tonight; advance to carb modified diet tomorrow if tolerates -Hypokalemia - replace with 40 mEq Kdur tonight  Marin Olphristopher Tyria Springer, MD [facility] Use [email or web address] to contact on call provider

## 2021-03-20 LAB — HIV ANTIBODY (ROUTINE TESTING W REFLEX): HIV Screen 4th Generation wRfx: NONREACTIVE

## 2021-03-20 LAB — URINALYSIS, ROUTINE W REFLEX MICROSCOPIC
Bacteria, UA: NONE SEEN
Bilirubin Urine: NEGATIVE
Glucose, UA: NEGATIVE mg/dL
Hgb urine dipstick: NEGATIVE
Ketones, ur: 20 mg/dL — AB
Leukocytes,Ua: NEGATIVE
Nitrite: NEGATIVE
Protein, ur: 30 mg/dL — AB
Specific Gravity, Urine: >1.046 — ABNORMAL HIGH (ref 1.005–1.030)
pH: 5 (ref 5.0–8.0)

## 2021-03-20 LAB — BASIC METABOLIC PANEL
Anion gap: 10 (ref 5–15)
BUN: 20 mg/dL (ref 6–20)
CO2: 22 mmol/L (ref 22–32)
Calcium: 8.8 mg/dL — ABNORMAL LOW (ref 8.9–10.3)
Chloride: 103 mmol/L (ref 98–111)
Creatinine, Ser: 0.7 mg/dL (ref 0.44–1.00)
GFR, Estimated: >60 mL/min (ref >60)
Glucose, Bld: 165 mg/dL — ABNORMAL HIGH (ref 70–99)
Potassium: 4.5 mmol/L (ref 3.5–5.1)
Sodium: 135 mmol/L (ref 135–145)

## 2021-03-20 LAB — CBG MONITORING, ED
Glucose-Capillary: 125 mg/dL — ABNORMAL HIGH (ref 70–99)
Glucose-Capillary: 153 mg/dL — ABNORMAL HIGH (ref 70–99)
Glucose-Capillary: 130 mg/dL — ABNORMAL HIGH (ref 70–99)

## 2021-03-20 SURGERY — IRRIGATION AND DEBRIDEMENT KNEE
Anesthesia: General | Site: Knee | Laterality: Right

## 2021-03-20 MED ORDER — ALUM & MAG HYDROXIDE-SIMETH 200-200-20 MG/5 ML NICU TOPICAL: 1.0000 "application " | TOPICAL | Status: DC | PRN

## 2021-03-20 MED ORDER — HYDROMORPHONE HCL 1 MG/ML IJ SOLN
0.5000 mg | INTRAMUSCULAR | Status: DC | PRN
  Administered 2021-03-20: 0.5 mg via INTRAVENOUS
  Filled 2021-03-20: qty 1

## 2021-03-20 MED ORDER — ALUM & MAG HYDROXIDE-SIMETH 200-200-20 MG/5ML PO SUSP
30.0000 mL | ORAL | Status: DC | PRN
  Administered 2021-03-20: 30 mL via ORAL
  Filled 2021-03-20: qty 30

## 2021-03-20 NOTE — Progress Notes (Signed)
1 Day Post-Op  Subjective: CC: Feel much better after hernia was reduced last night. She reports her current hernia is what it normally looks/feels like in regards to size/softness. Some mild generalized aching of the stomach. Had some occasional nausea but no emesis. Drinking clears. No flatus yet.   Objective: Vital signs in last 24 hours: Temp:  [97 F (36.1 C)-98.3 F (36.8 C)] 98.3 F (36.8 C) (07/19 0751) Pulse Rate:  [79-122] 103 (07/19 0751) Resp:  [15-24] 18 (07/19 0700) BP: (100-150)/(37-87) 109/84 (07/19 0751) SpO2:  [92 %-100 %] 94 % (07/19 0700) Weight:  [158.8 kg] 158.8 kg (07/18 1546)    Intake/Output from previous day: 07/18 0701 - 07/19 0700 In: 50.2 [I.V.:50.2] Out: -  Intake/Output this shift: No intake/output data recorded.  PE: Gen: Awake and alert, NAD Heart: reg rate on monitor Lungs: Normal rate and effort Abd: Abd obese, soft; Lateral ventral hernia is gently manipulated and able to be at least partially reduced and does not appear incarcerated as it is soft and freely moves through the fascia similar to the exam described yesterday.  No overlying skin changes. No clearly palpable hepatosplenomegaly  Lab Results:  Recent Labs    03/19/21 1615  WBC 16.7*  HGB 17.0*  HCT 50.3*  PLT 325   BMET Recent Labs    03/19/21 1615 03/20/21 0322  NA 136 135  K 3.1* 4.5  CL 99 103  CO2 21* 22  GLUCOSE 181* 165*  BUN 13 20  CREATININE 0.76 0.70  CALCIUM 9.6 8.8*   PT/INR No results for input(s): LABPROT, INR in the last 72 hours. CMP     Component Value Date/Time   NA 135 03/20/2021 0322   K 4.5 03/20/2021 0322   CL 103 03/20/2021 0322   CO2 22 03/20/2021 0322   GLUCOSE 165 (H) 03/20/2021 0322   BUN 20 03/20/2021 0322   CREATININE 0.70 03/20/2021 0322   CALCIUM 8.8 (L) 03/20/2021 0322   PROT 8.4 (H) 03/19/2021 1615   ALBUMIN 4.7 03/19/2021 1615   AST 29 03/19/2021 1615   ALT 22 03/19/2021 1615   ALKPHOS 69 03/19/2021 1615    BILITOT 0.3 03/19/2021 1615   GFRNONAA >60 03/20/2021 0322   GFRAA >60 11/02/2019 1651   Lipase     Component Value Date/Time   LIPASE 29 03/19/2021 1615       Studies/Results: CT Abdomen Pelvis W Contrast  Result Date: 03/19/2021 CLINICAL DATA:  Generalized and right-sided abdominal pain since this morning EXAM: CT ABDOMEN AND PELVIS WITH CONTRAST TECHNIQUE: Multidetector CT imaging of the abdomen and pelvis was performed using the standard protocol following bolus administration of intravenous contrast. CONTRAST:  OMNIPAQUE IOHEXOL 300 MG/ML  SOLN COMPARISON:  11/02/2019 FINDINGS: Lower chest: No acute pleural or parenchymal lung disease. Hepatobiliary: Diffuse hepatic steatosis unchanged. No focal liver abnormalities. Gallbladder is unremarkable. No biliary duct dilation. Pancreas: Unremarkable. No pancreatic ductal dilatation or surrounding inflammatory changes. Spleen: Normal in size without focal abnormality. Adrenals/Urinary Tract: Stable lobular contour of the kidneys likely congenital. Otherwise the kidneys enhance normally and symmetrically. No urinary tract calculi or obstructive uropathy. The adrenals and bladder are unremarkable. Stomach/Bowel: There is a large midline ventral hernia again noted. Since the previous exam, a closed loop small-bowel obstruction has developed, with transition points along the inferior margin of the hernia sac reference image 74/3 and at the left lateral margin of the hernia sac reference image 65/3. There is distension of the intervening loop  of jejunum measuring up to 3.5 cm, with scattered gas fluid levels. Mild jejunal wall thickening is also noted, with surrounding mesenteric stranding. Trace amount of free fluid within the hernia sac. No evidence of pneumatosis. Diverticulosis of the descending and sigmoid colon again noted without diverticulitis. Vascular/Lymphatic: Aortic atherosclerosis. No enlarged abdominal or pelvic lymph nodes.  Reproductive: Uterus and bilateral adnexa are unremarkable. Other: There is trace free fluid within the pelvis. No free intraperitoneal gas. As described above, there is a large ventral hernia again identified with abdominal wall defect measuring 15 x 9 cm. Herniation of large and small bowel through the hernia defect, with developing closed loop jejunal obstruction as described above. Extensive mesenteric edema and trace free fluid within the hernia sac may be related to the closed loop obstruction or due to hernia incarceration. Musculoskeletal: No acute or destructive bony lesions. Reconstructed images demonstrate no additional findings. IMPRESSION: 1. Closed loop jejunal obstruction, with transition points located along the left lateral and inferior margins of the large ventral hernia seen previously. Distension of the jejunal loop, with scattered gas fluid levels and mild jejunal wall thickening. No evidence of pneumatosis at this time. 2. Significant fat stranding within the mesentery adjacent to the closed loop jejunal obstruction, as well as throughout the hernia sac. Trace fluid within the hernia sac as well. Differential includes reactive changes from small-bowel obstruction versus hernia incarceration. Surgical consultation recommended. 3. Sigmoid diverticulosis without diverticulitis. 4. Hepatic steatosis. 5.  Aortic Atherosclerosis (ICD10-I70.0). Critical Value/emergent results were called by telephone at the time of interpretation on 03/19/2021 at 6:07 pm to provider DR Denton Lank, who verbally acknowledged these results. Electronically Signed   By: Sharlet Salina M.D.   On: 03/19/2021 18:09    Anti-infectives: Anti-infectives (From admission, onward)    None        Assessment/Plan Incarcerated small bowel containing recurrent incisional/ventral hernia - Hernia reduced by Dr. Cliffton Asters at bedside 7/18.  - Ventral hernia at least partially reducible and does not appear incarcerated as it is soft  and freely moves through the fascia similar to the exam described yesterday. No skin changes. She reports that her hernia currently feels much like it usually does with regards to its size and softness.   - Adv diet. Recheck this PM for possible discharge. If abel to be discharged would recommend an academic center referral to discuss surgical repair given hernia recurrence.   FEN - CM VTE - SCDs, lovenox  ID - None   Hypokalemia - resolved HTN - home meds  DM - SSI GERD HLD PCOS   LOS: 0 days    Jacinto Halim , [facility] 03/20/2021, 8:34 AM Please see Amion for pager number during day hours 7:00am-4:30pm

## 2021-03-20 NOTE — ED Notes (Signed)
No N/V after PO intake. PA Trixie Deis made aware.

## 2021-03-20 NOTE — Discharge Summary (Signed)
Physician Discharge Summary  Patient ID: Kelly Vargas MRN: [id number] DOB/AGE: 09-13-1970 50 y.o.  Admit date: 03/19/2021 Discharge date: 03/20/2021  Discharge Diagnoses Ventral hernia, chronic - s/p manual reduction  Consultants None  Procedures None   HPI: Kelly Vargas is an 50 y.o. female with hx of HTN, DM, GERD, HLD, PCOS, whom reports a history of 2 prior hernia repairs with mesh-1 being remote around her umbilicus and a subsequent in 2012 with Dr. Lindie Spruce for an incisional/paramedian ventral hernia with mesh.  She developed a recurrence.  She has been dealing with this for many years.  She reports she is currently seeing [facility] and attempt to lose weight with diet/exercise.  She reports that with the assistance of some medication and eating a better diet she is lost approximately 75 pounds intentionally in the last 6 months.  She has had a large incisional hernia that she has known about for many years and works to keep it in a reduced type position in the evening.  She is generally able to at least partially reduce it until it is soft.  For the last day she has been unable to reduce any of it and it has become hard and uncomfortable.  She presented to med [facility] for further evaluation.   She reports that attempted bedside reduction there were unsuccessful.  She was transferred to [facility] for further evaluation  Hospital Course: Patient was evaluated in ED by general surgery and hernia was able to be reduced. She was admitted for observation and given PO challenge. She was able to tolerate diet and pain improved. She was discharged from the ED 7/19 with strict return precautions. [facility] office to send referral to tertiary care center for evaluation for elective hernia repair and patient notified of this.     Allergies as of 03/20/2021       Reactions   Chamomile Swelling   Lips and mouth   Levomenol Swelling   Penicillins Hives   Did it involve swelling of  the face/tongue/throat, SOB, or low BP? No Did it involve sudden or severe rash/hives, skin peeling, or any reaction on the inside of your mouth or nose? Yes Did you need to seek medical attention at a hospital or doctor's office? No When did it last happen?28 years ago If all above answers are "NO", may proceed with cephalosporin use.   Atorvastatin Other (See Comments)   Joint aches/pain   Erythromycin Nausea And Vomiting   Morphine Nausea And Vomiting        Medication List     STOP taking these medications    ondansetron 4 MG disintegrating tablet Commonly known as: Zofran ODT       TAKE these medications    acetaminophen 500 MG tablet Commonly known as: TYLENOL Take 1,000 mg by mouth every 6 (six) hours as needed for headache (pain).   albuterol 108 (90 Base) MCG/ACT inhaler Commonly known as: VENTOLIN HFA Inhale 1-2 puffs into the lungs every 6 (six) hours as needed for wheezing or shortness of breath.   ALPRAZolam 0.5 MG tablet Commonly known as: XANAX Take 0.5 mg by mouth 3 (three) times daily as needed for anxiety.   ASPERCREME EX Apply 1 application topically 4 (four) times daily as needed (pain.).   aspirin EC 81 MG tablet Take 81 mg by mouth every morning.   COENZYME Q10-RED YEAST RICE PO Take 1 capsule by mouth every morning.   diphenhydrAMINE 25 MG tablet Commonly  known as: BENADRYL Take 25 mg by mouth at bedtime.   DULoxetine 60 MG capsule Commonly known as: CYMBALTA Take 60 mg by mouth every morning.   escitalopram 20 MG tablet Commonly known as: LEXAPRO Take 10-20 mg by mouth See admin instructions. Take 1/2 tablet (10 mg) by mouth every morning, increase to 1 tablet (20 mg) during the week of menstrual cycle What changed: Another medication with the same name was removed. Continue taking this medication, and follow the directions you see here.   famotidine 20 MG tablet Commonly known as: PEPCID Take 20 mg by mouth every evening.    hydrochlorothiazide 25 MG tablet Commonly known as: HYDRODIURIL Take 25 mg by mouth every morning.   lisinopril 5 MG tablet Commonly known as: ZESTRIL Take 5 mg by mouth every morning.   metFORMIN 500 MG 24 hr tablet Commonly known as: GLUCOPHAGE-XR Take 1,000 mg by mouth 2 (two) times a day.   montelukast 10 MG tablet Commonly known as: SINGULAIR Take 10 mg by mouth every morning.   multivitamin with minerals Tabs tablet Take 1 tablet by mouth every morning. Women's One-A-Day Multivitamin   naproxen sodium 220 MG tablet Commonly known as: ALEVE Take 440 mg by mouth 2 (two) times daily as needed (pain).   OZEMPIC (2 MG/DOSE)  Inject 2 mg into the skin every Friday.   phentermine 15 MG capsule Take 15 mg by mouth every morning.   traZODone 50 MG tablet Commonly known as: DESYREL Take 50 mg by mouth at bedtime.   vitamin B-12 1000 MCG tablet Commonly known as: CYANOCOBALAMIN Take 1,000 mcg by mouth every morning.   Vitamin D-3 125 MCG (5000 UT) Tabs Take 5,000 Units by mouth every morning.       ASK your doctor about these medications    spironolactone 50 MG tablet Commonly known as: ALDACTONE TAKE 1 TABLET BY MOUTH  DAILY. TAKE WITH BREAKFAST.           Signed: Juliet Rude , [facility] 03/20/2021, 3:52 PM Please see Amion for pager number during day hours 7:00am-4:30pm

## 2021-03-20 NOTE — ED Notes (Signed)
Per PA Trixie Deis, pt ok to be discharged from ED.

## 2021-03-20 NOTE — Discharge Instructions (Addendum)
[facility] office is working on referral to a tertiary care center for evaluation for hernia repair.   Return Precautions: - severe pain  - hernia feels stuck out  - intolerance of PO intake or nausea and vomiting  - skin changes over hernia - fever > 100.4

## 2021-03-20 NOTE — ED Notes (Signed)
Pt attached to cardiac monitor x2. VSS. A&O x4. Pt rates pain tolerable at a 2/10 at this time, generalized aching to the abdomen. She denies any additional complaints or concerns at this time.

## 2021-04-08 IMAGING — CT CT ABD-PELV W/ CM
2 of 5 series · 16 of 46 positions shown, 18 images · IV contrast (omnipaque)
Comparison: CT scan of the abdomen dated 03/16/2019

CLINICAL DATA: Acute abdominal pain. Vomiting. Previous small bowel
obstruction.

EXAM:
CT ABDOMEN AND PELVIS WITH CONTRAST
TECHNIQUE: Multidetector CT imaging of the abdomen and pelvis was performed
using the standard protocol following bolus administration of
intravenous contrast.
CONTRAST:  100mL OMNIPAQUE IOHEXOL 300 MG/ML  SOLN

[Series 2: axial st · axial · 0.92mm/px · z∈[-697,-242]mm · 13 of 103 slices shown, 15 images]
[im 6/103  soft-tissue]
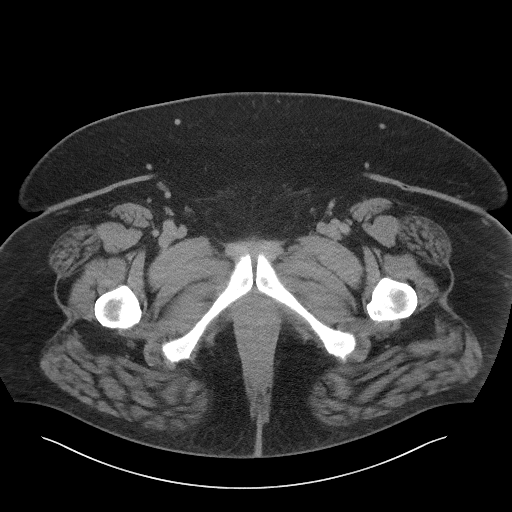
[im 6/103  bone]
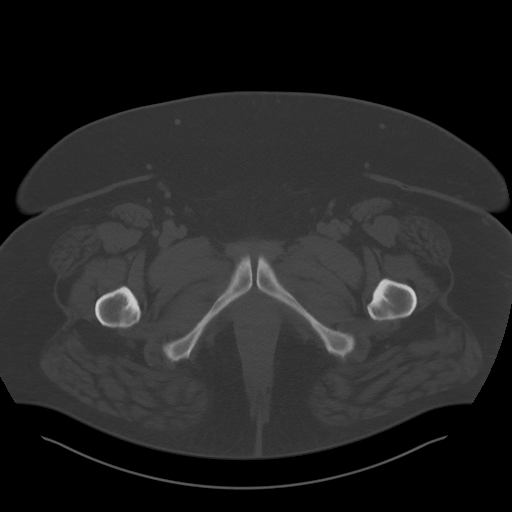
[im 17/103  soft-tissue]
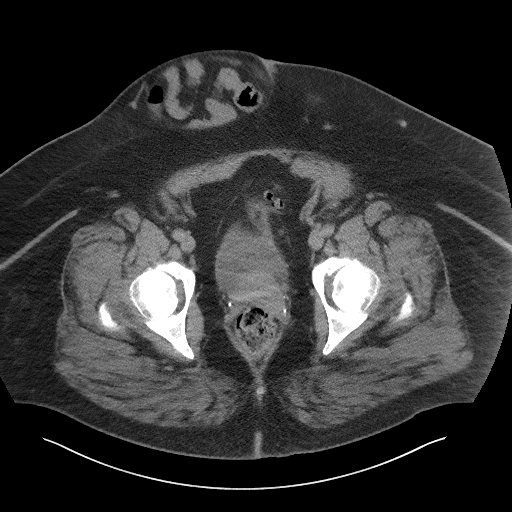
[im 22/103  soft-tissue]
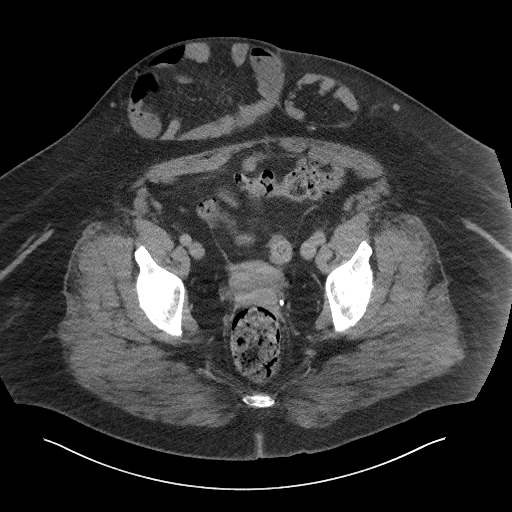
[im 27/103  soft-tissue]
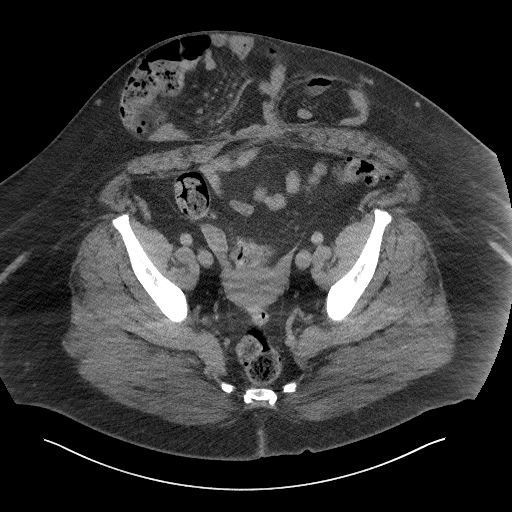
[im 38/103  soft-tissue]
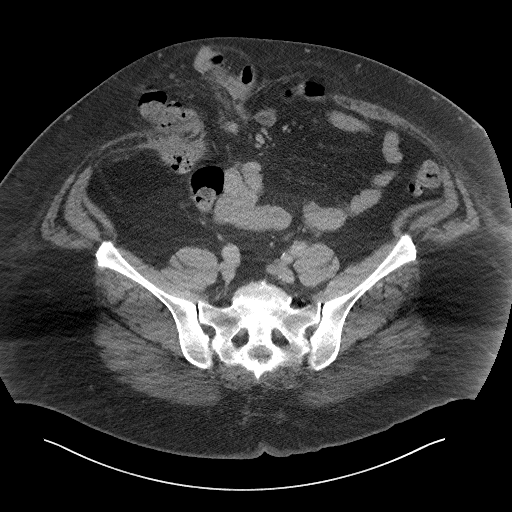
[im 43/103  soft-tissue]
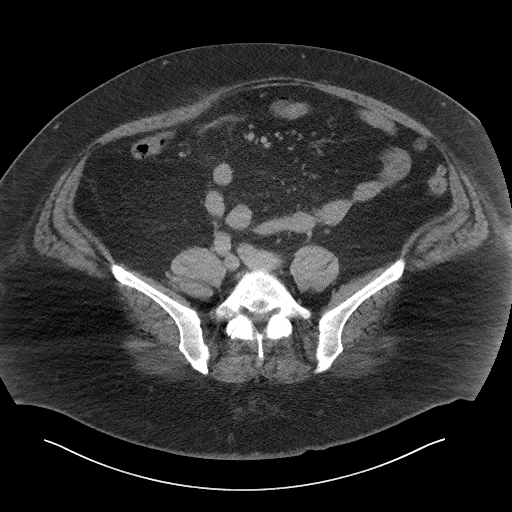
[im 54/103  soft-tissue]
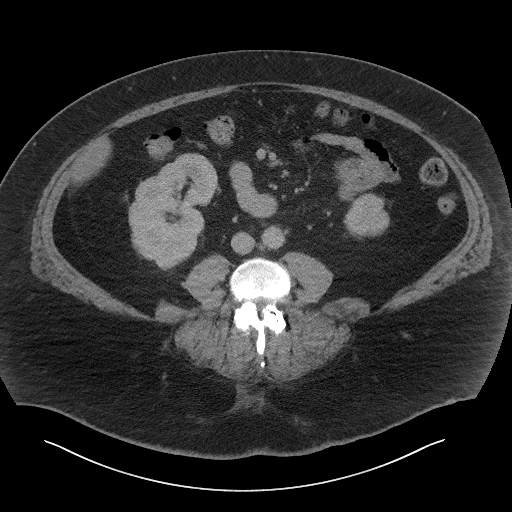
[im 60/103  soft-tissue]
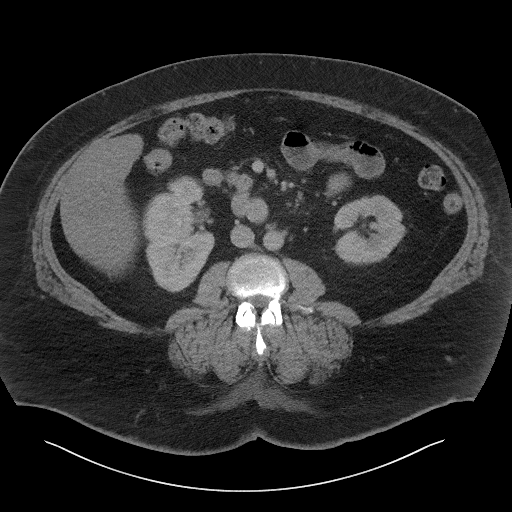
[im 65/103  soft-tissue]
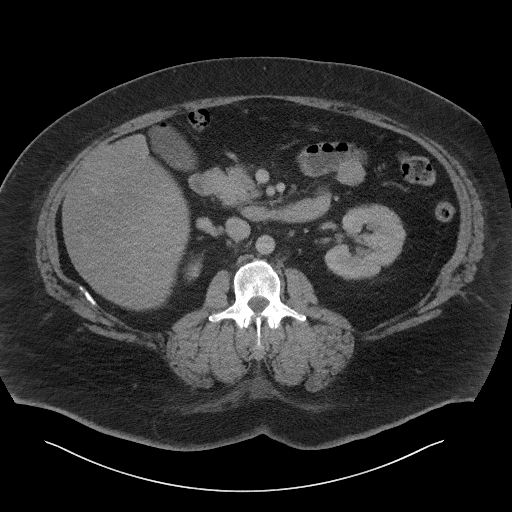
[im 65/103  bone]
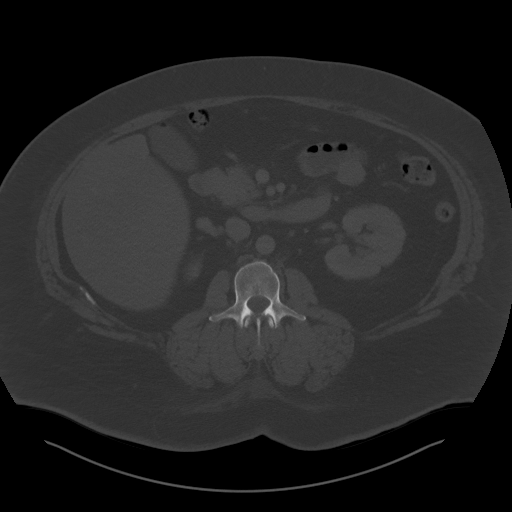
[im 76/103  soft-tissue]
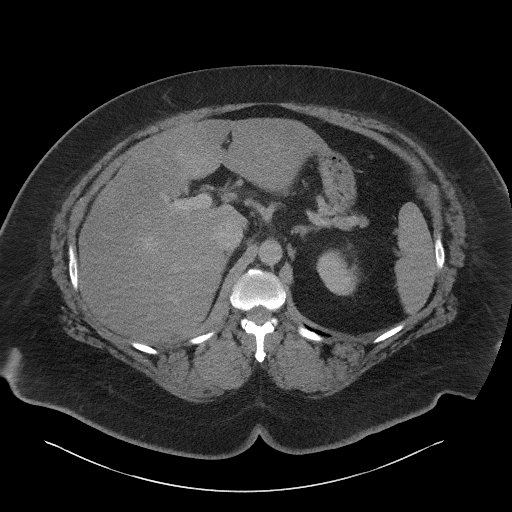
[im 81/103  soft-tissue]
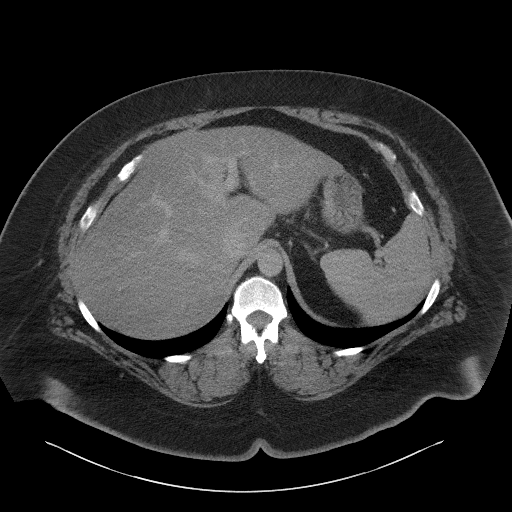
[im 86/103  soft-tissue]
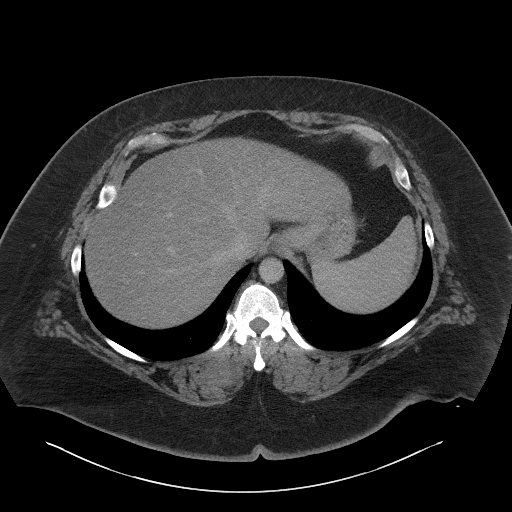
[im 97/103  soft-tissue]
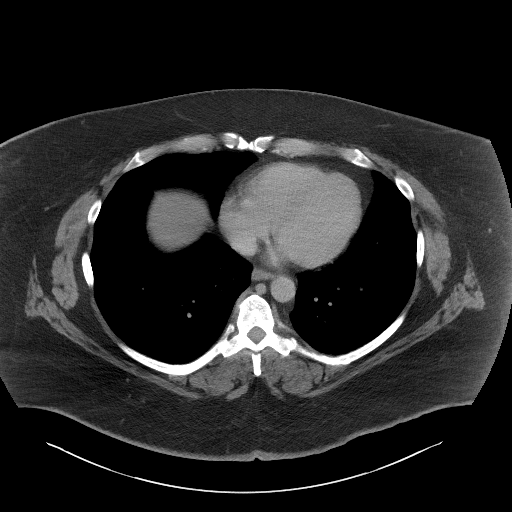

[Series 5: coronal st · coronal · 0.97mm/px · 3 of 123 slices shown]
[im 41/123  soft-tissue]
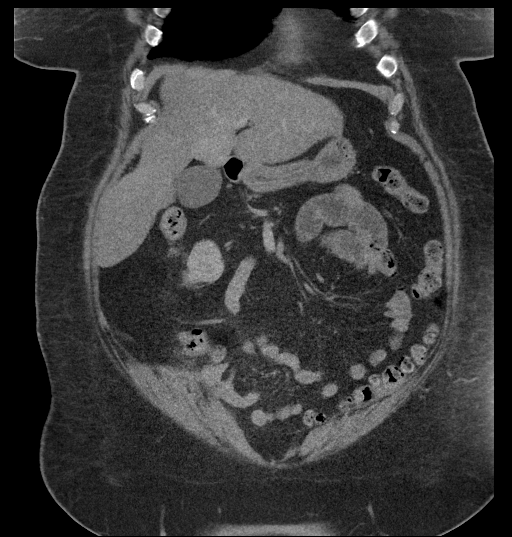
[im 55/123  soft-tissue]
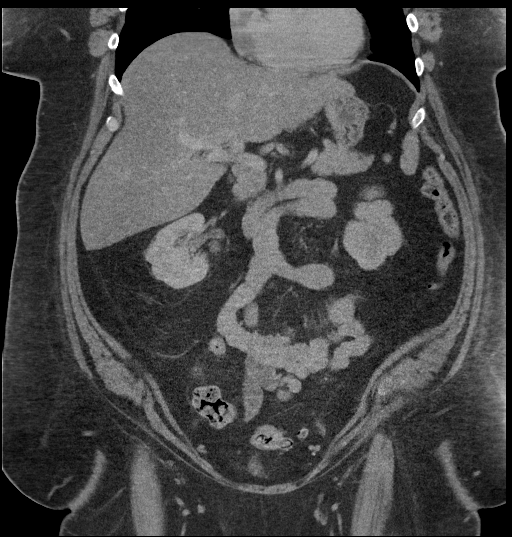
[im 68/123  soft-tissue]
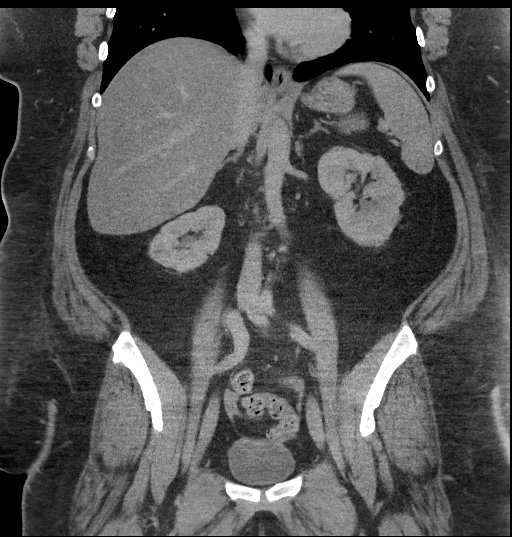

[16 of 46 positions shown; findings below may reference images not displayed]

FINDINGS: Lower chest: Normal.

Hepatobiliary: Chronic hepatic steatosis. No focal liver lesions.
Biliary tree is normal.

Pancreas: Unremarkable. No pancreatic ductal dilatation or
surrounding inflammatory changes.

Spleen: Normal in size without focal abnormality.

Adrenals/Urinary Tract: The adrenal glands are normal. Lobulated
contour of the kidneys bilaterally, stable. No mass lesion or
hydronephrosis or renal or ureteral calculi. Bladder is normal.

Stomach/Bowel: There is a large bilobed anterior abdominal wall
hernia containing loops of large and small bowel. The cecum is now
within the hernia, new since the prior study. No dilated bowel
proximal or distal to the hernia. Scattered diverticula in the
distal colon. Appendix is normal. Stomach appears normal.

Vascular/Lymphatic: No significant vascular findings are present. No
enlarged abdominal or pelvic lymph nodes.

Reproductive: Uterus and bilateral adnexa are unremarkable.

Other: No ascites.

Musculoskeletal: The defect in the anterior abdominal wall is
approximately 13.5 cm in diameter at the site of the hernia
containing large and small bowel. No acute bone abnormality. Chronic
severe bilateral facet arthritis at L3-4 with moderately severe
spinal stenosis.
IMPRESSION: 1. Large bilobed anterior abdominal wall hernia containing loops of
large and small bowel. The cecum is now within the hernia, new since
the prior study. No evidence of bowel obstruction.
2. Chronic hepatic steatosis.
3. Chronic severe bilateral facet arthritis at L3-4 with moderately
severe spinal stenosis.

## 2022-02-19 ENCOUNTER — Ambulatory Visit: Payer: 59 | Admitting: Adult Health

## 2022-05-02 ENCOUNTER — Ambulatory Visit (INDEPENDENT_AMBULATORY_CARE_PROVIDER_SITE_OTHER): Payer: 59 | Admitting: Adult Health

## 2022-05-02 ENCOUNTER — Encounter: Payer: Self-pay | Admitting: Adult Health

## 2022-05-02 VITALS — BP 128/74 | HR 80 | Ht 72.0 in | Wt 372.0 lb

## 2022-05-02 DIAGNOSIS — G4733 Obstructive sleep apnea (adult) (pediatric): Secondary | ICD-10-CM | POA: Diagnosis not present

## 2022-05-02 NOTE — Progress Notes (Signed)
PATIENT: Kelly Vargas DOB: [birth year]  REASON FOR VISIT: follow up HISTORY FROM: patient PRIMARY NEUROLOGIST: Dr. Rexene Alberts  Chief Complaint  Patient presents with   RM 19    Here alone for CPAP follow-up. States things are going well. Soil scientist. ESS 7.     HISTORY OF PRESENT ILLNESS: Today 05/02/22:  Kelly Vargas is a 51 year old female with a history of obstructive sleep apnea on CPAP.  She returns today for follow-up.  She reports that the CPAP is working well.  She denies any new issues.  Continues to see the benefit.  Her download is below    REVIEW OF SYSTEMS: Out of a complete 14 system review of symptoms, the patient complains only of the following symptoms, and all other reviewed systems are negative.  ESS 7  ALLERGIES: Allergies  Allergen Reactions   Chamomile Swelling    Lips and mouth   Levomenol Swelling   Penicillins Hives    Did it involve swelling of the face/tongue/throat, SOB, or low BP? No Did it involve sudden or severe rash/hives, skin peeling, or any reaction on the inside of your mouth or nose? Yes Did you need to seek medical attention at a hospital or doctor's office? No When did it last happen?28 years ago If all above answers are "NO", may proceed with cephalosporin use.    Atorvastatin Other (See Comments)    Joint aches/pain   Erythromycin Nausea And Vomiting   Morphine Nausea And Vomiting    HOME MEDICATIONS: Outpatient Medications Prior to Visit  Medication Sig Dispense Refill   acetaminophen (TYLENOL) 500 MG tablet Take 1,000 mg by mouth every 6 (six) hours as needed for headache (pain).     albuterol (VENTOLIN HFA) 108 (90 Base) MCG/ACT inhaler Inhale 1-2 puffs into the lungs every 6 (six) hours as needed for wheezing or shortness of breath.     ALPRAZolam (XANAX) 0.5 MG tablet Take 0.5 mg by mouth 3 (three) times daily as needed for anxiety.      aspirin EC 81 MG tablet Take 81 mg by mouth every morning.     Cholecalciferol  (VITAMIN D-3) 125 MCG (5000 UT) TABS Take 5,000 Units by mouth every morning.     COENZYME Q10-RED YEAST RICE PO Take 1 capsule by mouth every morning.     diphenhydrAMINE (BENADRYL) 25 MG tablet Take 25 mg by mouth at bedtime.     DULoxetine (CYMBALTA) 60 MG capsule Take 60 mg by mouth every morning.     escitalopram (LEXAPRO) 20 MG tablet Take 10-20 mg by mouth See admin instructions. Take 1/2 tablet (10 mg) by mouth every morning, increase to 1 tablet (20 mg) during the week of menstrual cycle     famotidine (PEPCID) 20 MG tablet Take 20 mg by mouth every evening.      hydrochlorothiazide (HYDRODIURIL) 25 MG tablet Take 25 mg by mouth every morning.     lisinopril (ZESTRIL) 5 MG tablet Take 5 mg by mouth every morning.     metFORMIN (GLUCOPHAGE-XR) 500 MG 24 hr tablet Take 1,000 mg by mouth 2 (two) times a day.     montelukast (SINGULAIR) 10 MG tablet Take 10 mg by mouth every morning.     Multiple Vitamin (MULTIVITAMIN WITH MINERALS) TABS tablet Take 1 tablet by mouth every morning. Women's One-A-Day Multivitamin     naproxen sodium (ALEVE) 220 MG tablet Take 440 mg by mouth 2 (two) times daily as needed (pain).  phentermine 15 MG capsule Take 15 mg by mouth every morning.     Semaglutide (OZEMPIC, 2 MG/DOSE, [place]) Inject 2 mg into the skin every Friday.     spironolactone (ALDACTONE) 50 MG tablet TAKE 1 TABLET BY MOUTH  DAILY. TAKE WITH BREAKFAST. (Patient taking differently: Take 50 mg by mouth every morning.) 30 tablet 12   traZODone (DESYREL) 50 MG tablet Take 50 mg by mouth at bedtime.     Trolamine Salicylate (ASPERCREME EX) Apply 1 application topically 4 (four) times daily as needed (pain.).     vitamin B-12 (CYANOCOBALAMIN) 1000 MCG tablet Take 1,000 mcg by mouth every morning.     No facility-administered medications prior to visit.    PAST MEDICAL HISTORY: Past Medical History:  Diagnosis Date   Anxiety    Asthma    Complication of anesthesia    Depression    Diabetes  mellitus without complication (HCC)    GERD (gastroesophageal reflux disease)    Hernia    Hypertension    IBS (irritable bowel syndrome)    Migraine    Morbid obesity (HCC)    Polycystic ovarian disease    PONV (postoperative nausea and vomiting)    Rosacea    Sleep apnea    Small bowel obstruction (HCC)     PAST SURGICAL HISTORY: Past Surgical History:  Procedure Laterality Date   ADENOIDECTOMY     BACK SURGERY     DILATATION & CURETTAGE/HYSTEROSCOPY WITH MYOSURE N/A 02/17/2019   Procedure: DILATATION & CURETTAGE/HYSTEROSCOPY WITH MYOSURE;  Surgeon: Myna Hidalgo, DO;  Location: [facility] OR;  Service: Gynecology;  Laterality: N/A;   HERNIA REPAIR     TONSILECTOMY, ADENOIDECTOMY, BILATERAL MYRINGOTOMY AND TUBES     UMBILICAL HERNIA REPAIR     ventrical hernia repair      FAMILY HISTORY: Family History  Problem Relation Age of Onset   Heart disease Mother    Cancer Mother    Stroke Mother    Diabetes Mother    Hypertension Mother    Heart disease Father    Hypertension Brother    High Cholesterol Brother    Depression Brother     SOCIAL HISTORY: Social History   Socioeconomic History   Marital status: Legally Separated    Spouse name: [name]   Number of children: 0   Years of education: Market researcher.   Highest education level: Not on file  Occupational History    Employer: [employer]  Tobacco Use   Smoking status: Former    Packs/day: 1.00    Years: 20.00    Total pack years: 20.00    Types: Cigarettes    Quit date: 09/02/2006    Years since quitting: 15.6   Smokeless tobacco: Never  Vaping Use   Vaping Use: Never used  Substance and Sexual Activity   Alcohol use: Not Currently   Drug use: No   Sexual activity: Yes    Partners: Female  Other Topics Concern   Not on file  Social History Narrative   Patient lives at home with spouse.   Caffeine Use: 2.5 cups of coffee daily   Social Determinants of Health   Financial Resource Strain: Not on file  Food  Insecurity: Not on file  Transportation Needs: Not on file  Physical Activity: Not on file  Stress: Not on file  Social Connections: Not on file  Intimate Partner Violence: Not on file      PHYSICAL EXAM  Vitals:   05/02/22 1310  Weight: Marland Kitchen)  372 lb (168.7 kg)  Height: 6' (1.829 m)   Body mass index is 50.45 kg/m.  Generalized: Well developed, in no acute distress  Chest: Lungs clear to auscultation bilaterally  Neurological examination  Mentation: Alert oriented to time, place, history taking. Follows all commands speech and language fluent Cranial nerve II-XII: Extraocular movements were full, visual field were full on confrontational test Head turning and shoulder shrug  were normal and symmetric.  DIAGNOSTIC DATA (LABS, IMAGING, TESTING) - I reviewed patient records, labs, notes, testing and imaging myself where available.  Lab Results  Component Value Date   WBC 16.7 (H) 03/19/2021   HGB 17.0 (H) 03/19/2021   HCT 50.3 (H) 03/19/2021   MCV 89.3 03/19/2021   PLT 325 03/19/2021      Component Value Date/Time   NA 135 03/20/2021 0322   K 4.5 03/20/2021 0322   CL 103 03/20/2021 0322   CO2 22 03/20/2021 0322   GLUCOSE 165 (H) 03/20/2021 0322   BUN 20 03/20/2021 0322   CREATININE 0.70 03/20/2021 0322   CALCIUM 8.8 (L) 03/20/2021 0322   PROT 8.4 (H) 03/19/2021 1615   ALBUMIN 4.7 03/19/2021 1615   AST 29 03/19/2021 1615   ALT 22 03/19/2021 1615   ALKPHOS 69 03/19/2021 1615   BILITOT 0.3 03/19/2021 1615   GFRNONAA >60 03/20/2021 0322   GFRAA >60 11/02/2019 1651       ASSESSMENT AND PLAN 51 y.o. year old female  has a past medical history of Anxiety, Asthma, Complication of anesthesia, Depression, Diabetes mellitus without complication (HCC), GERD (gastroesophageal reflux disease), Hernia, Hypertension, IBS (irritable bowel syndrome), Migraine, Morbid obesity (HCC), Polycystic ovarian disease, PONV (postoperative nausea and vomiting), Rosacea, Sleep apnea, and  Small bowel obstruction (HCC). here with:  OSA on CPAP  - CPAP compliance excellent - Good treatment of AHI  - Encourage patient to use CPAP nightly and > 4 hours each night - F/U in 1 year or sooner if needed   Butch Penny, MSN, NP-C 05/02/2022, 1:11 PM [facility] [address], [address] [place], [place] [address] [phone]

## 2022-05-02 NOTE — Patient Instructions (Signed)
Continue using CPAP nightly and greater than 4 hours each night °If your symptoms worsen or you develop new symptoms please let us know.  ° °

## 2023-02-10 ENCOUNTER — Other Ambulatory Visit: Payer: Self-pay | Admitting: Nurse Practitioner

## 2023-02-10 DIAGNOSIS — Z1231 Encounter for screening mammogram for malignant neoplasm of breast: Secondary | ICD-10-CM

## 2023-02-25 ENCOUNTER — Ambulatory Visit
Admission: RE | Admit: 2023-02-25 | Discharge: 2023-02-25 | Disposition: A | Discharge status: Home / Self Care | Payer: 59 | Source: Ambulatory Visit | Attending: Nurse Practitioner | Admitting: Nurse Practitioner

## 2023-02-25 DIAGNOSIS — Z1231 Encounter for screening mammogram for malignant neoplasm of breast: Secondary | ICD-10-CM

## 2023-05-06 NOTE — Progress Notes (Signed)
PATIENT: Kelly Vargas DOB: 1970/09/09  REASON FOR VISIT: follow up HISTORY FROM: patient PRIMARY NEUROLOGIST: Dr. Frances Furbish  Chief Complaint  Patient presents with   Obstructive Sleep Apnea    Rm 19 alone Pt is well and stable, no new concerns with OSA/CPAP. Does mention a rash on face since mid Aug were her mask sits.      HISTORY OF PRESENT ILLNESS: Today 05/06/23:  Kelly Vargas is a 52 y.o. female with OSA on Bipap. Returns today for follow-up.  Download is below.  She reports that CPAP is working well.  She does state that she has had a facial rash since August.  She has not seen her PCP.  Has been using over-the-counter antibiotic cream.  Download is below     05/02/22: [name] is a 52 year old female with a history of obstructive sleep apnea on CPAP.  She returns today for follow-up.  She reports that the CPAP is working well.  She denies any new issues.  Continues to see the benefit.  Her download is below    REVIEW OF SYSTEMS: Out of a complete 14 system review of symptoms, the patient complains only of the following symptoms, and all other reviewed systems are negative.  ESS 7  ALLERGIES: Allergies  Allergen Reactions   Chamomile Swelling    Lips and mouth   Levomenol Swelling   Penicillins Hives    Did it involve swelling of the face/tongue/throat, SOB, or low BP? No Did it involve sudden or severe rash/hives, skin peeling, or any reaction on the inside of your mouth or nose? Yes Did you need to seek medical attention at a hospital or doctor's office? No When did it last happen?28 years ago If all above answers are "NO", may proceed with cephalosporin use.    Atorvastatin Other (See Comments)    Joint aches/pain   Erythromycin Nausea And Vomiting   Morphine Nausea And Vomiting    HOME MEDICATIONS: Outpatient Medications Prior to Visit  Medication Sig Dispense Refill   acetaminophen (TYLENOL) 500 MG tablet Take 1,000 mg by mouth every 6 (six)  hours as needed for headache (pain).     albuterol (VENTOLIN HFA) 108 (90 Base) MCG/ACT inhaler Inhale 1-2 puffs into the lungs every 6 (six) hours as needed for wheezing or shortness of breath.     ALPRAZolam (XANAX) 0.5 MG tablet Take 0.5 mg by mouth 3 (three) times daily as needed for anxiety.      aspirin EC 81 MG tablet Take 81 mg by mouth every morning.     Cholecalciferol (VITAMIN D-3) 125 MCG (5000 UT) TABS Take 5,000 Units by mouth every morning.     COENZYME Q10-RED YEAST RICE PO Take 1 capsule by mouth every morning.     diphenhydrAMINE (BENADRYL) 25 MG tablet Take 25 mg by mouth at bedtime.     DULoxetine (CYMBALTA) 60 MG capsule Take 60 mg by mouth every morning.     escitalopram (LEXAPRO) 20 MG tablet Take 10-20 mg by mouth See admin instructions. Take 1/2 tablet (10 mg) by mouth every morning, increase to 1 tablet (20 mg) during the week of menstrual cycle (Patient not taking: Reported on 05/02/2022)     famotidine (PEPCID) 20 MG tablet Take 20 mg by mouth every evening.      hydrochlorothiazide (HYDRODIURIL) 25 MG tablet Take 25 mg by mouth every morning.     lisinopril (ZESTRIL) 5 MG tablet Take 5 mg by mouth every morning.  metFORMIN (GLUCOPHAGE-XR) 500 MG 24 hr tablet Take 1,000 mg by mouth 2 (two) times a day.     montelukast (SINGULAIR) 10 MG tablet Take 10 mg by mouth every morning.     Multiple Vitamin (MULTIVITAMIN WITH MINERALS) TABS tablet Take 1 tablet by mouth every morning. Women's One-A-Day Multivitamin     naproxen sodium (ALEVE) 220 MG tablet Take 440 mg by mouth 2 (two) times daily as needed (pain).     phentermine 15 MG capsule Take 15 mg by mouth every morning. (Patient not taking: Reported on 05/02/2022)     Semaglutide (OZEMPIC, 2 MG/DOSE, [place]) Inject 2 mg into the skin every Friday.     spironolactone (ALDACTONE) 50 MG tablet TAKE 1 TABLET BY MOUTH  DAILY. TAKE WITH BREAKFAST. (Patient taking differently: Take 50 mg by mouth every morning.) 30 tablet 12    traZODone (DESYREL) 50 MG tablet Take 50 mg by mouth at bedtime.     Trolamine Salicylate (ASPERCREME EX) Apply 1 application topically 4 (four) times daily as needed (pain.).     Vilazodone HCl 20 MG TABS Take 20 mg by mouth at bedtime.     vitamin B-12 (CYANOCOBALAMIN) 1000 MCG tablet Take 1,000 mcg by mouth every morning.     No facility-administered medications prior to visit.    PAST MEDICAL HISTORY: Past Medical History:  Diagnosis Date   Anxiety    Asthma    Complication of anesthesia    Depression    Diabetes mellitus without complication (HCC)    GERD (gastroesophageal reflux disease)    Hernia    Hypertension    IBS (irritable bowel syndrome)    Migraine    Morbid obesity (HCC)    Polycystic ovarian disease    PONV (postoperative nausea and vomiting)    Rosacea    Sleep apnea    Small bowel obstruction (HCC)     PAST SURGICAL HISTORY: Past Surgical History:  Procedure Laterality Date   ADENOIDECTOMY     BACK SURGERY     DILATATION & CURETTAGE/HYSTEROSCOPY WITH MYOSURE N/A 02/17/2019   Procedure: DILATATION & CURETTAGE/HYSTEROSCOPY WITH MYOSURE;  Surgeon: Myna Hidalgo, DO;  Location: [facility] OR;  Service: Gynecology;  Laterality: N/A;   HERNIA REPAIR     TONSILECTOMY, ADENOIDECTOMY, BILATERAL MYRINGOTOMY AND TUBES     UMBILICAL HERNIA REPAIR     ventrical hernia repair      FAMILY HISTORY: Family History  Problem Relation Age of Onset   Heart disease Mother    Cancer Mother    Stroke Mother    Diabetes Mother    Hypertension Mother    Heart attack Mother    Heart disease Father    Hypertension Brother    High Cholesterol Brother    Depression Brother     SOCIAL HISTORY: Social History   Socioeconomic History   Marital status: Legally Separated    Spouse name: [name]   Number of children: 0   Years of education: Market researcher.   Highest education level: Not on file  Occupational History    Employer: [employer]  Tobacco Use   Smoking status: Former     Current packs/day: 0.00    Average packs/day: 1 pack/day for 20.0 years (20.0 ttl pk-yrs)    Types: Cigarettes    Start date: 09/02/1986    Quit date: 09/02/2006    Years since quitting: 16.6   Smokeless tobacco: Never  Vaping Use   Vaping status: Never Used  Substance and Sexual Activity  Alcohol use: Not Currently   Drug use: No   Sexual activity: Yes    Partners: Female  Other Topics Concern   Not on file  Social History Narrative   Patient lives at home with spouse.   Caffeine Use: 2.5 cups of coffee daily   Social Determinants of Health   Financial Resource Strain: Low Risk  (06/16/2021)   Received from [facility], [facility]   Overall Financial Resource Strain (CARDIA)    Difficulty of Paying Living Expenses: Not very hard  Food Insecurity: No Food Insecurity (06/16/2021)   Received from [facility], [facility]   Hunger Vital Sign    Worried About Running Out of Food in the Last Year: Never true    Ran Out of Food in the Last Year: Never true  Transportation Needs: No Transportation Needs (06/16/2021)   Received from [facility], [facility]   PRAPARE - Transportation    Lack of Transportation (Medical): No    Lack of Transportation (Non-Medical): No  Physical Activity: Inactive (06/16/2021)   Received from [facility], [facility]   Exercise Vital Sign    Days of Exercise per Week: 0 days    Minutes of Exercise per Session: 0 min  Stress: Stress Concern Present (06/16/2021)   Received from [facility] Occupational Health - Occupational Stress Questionnaire    Feeling of Stress : Very much  Social Connections: Unknown (01/15/2022)   Received from [facility], [facility]   Social Network    Social Network: Not on file  Intimate Partner Violence: Unknown (12/07/2021)   Received from [facility], [facility]   HITS    Physically Hurt: Not on file    Insult or Talk Down To: Not on file     Threaten Physical Harm: Not on file    Scream or Curse: Not on file      PHYSICAL EXAM  Vitals:   05/07/23 1320 05/07/23 1323  BP: (!) 146/74 135/89  Pulse: 71 83  Weight: (!) 374 lb (169.6 kg)   Height: 6' (1.829 m)     Body mass index is 50.72 kg/m.  Generalized: Well developed, in no acute distress  Chest: Lungs clear to auscultation bilaterally  Neurological examination  Mentation: Alert oriented to time, place, history taking. Follows all commands speech and language fluent Cranial nerve II-XII: Facial symmetry noted  DIAGNOSTIC DATA (LABS, IMAGING, TESTING) - I reviewed patient records, labs, notes, testing and imaging myself where available.  Lab Results  Component Value Date   WBC 16.7 (H) 03/19/2021   HGB 17.0 (H) 03/19/2021   HCT 50.3 (H) 03/19/2021   MCV 89.3 03/19/2021   PLT 325 03/19/2021      Component Value Date/Time   NA 135 03/20/2021 0322   K 4.5 03/20/2021 0322   CL 103 03/20/2021 0322   CO2 22 03/20/2021 0322   GLUCOSE 165 (H) 03/20/2021 0322   BUN 20 03/20/2021 0322   CREATININE 0.70 03/20/2021 0322   CALCIUM 8.8 (L) 03/20/2021 0322   PROT 8.4 (H) 03/19/2021 1615   ALBUMIN 4.7 03/19/2021 1615   AST 29 03/19/2021 1615   ALT 22 03/19/2021 1615   ALKPHOS 69 03/19/2021 1615   BILITOT 0.3 03/19/2021 1615   GFRNONAA >60 03/20/2021 0322   GFRAA >60 11/02/2019 1651       ASSESSMENT AND PLAN 52 y.o. year old female  has a past medical history of Anxiety, Asthma, Complication of anesthesia,  Depression, Diabetes mellitus without complication (HCC), GERD (gastroesophageal reflux disease), Hernia, Hypertension, IBS (irritable bowel syndrome), Migraine, Morbid obesity (HCC), Polycystic ovarian disease, PONV (postoperative nausea and vomiting), Rosacea, Sleep apnea, and Small bowel obstruction (HCC). here with:  OSA on CPAP  - CPAP compliance excellent - Good treatment of AHI  - order placed for mask liners - advised to see her PCP her ongoing  facial rash - Encourage patient to use CPAP nightly and > 4 hours each night - F/U in 1 year or sooner if needed   Butch Penny, MSN, NP-C 05/06/2023, 4:26 PM [facility] [address] [place], [place] [address] [phone]

## 2023-05-07 ENCOUNTER — Ambulatory Visit: Payer: 59 | Admitting: Adult Health

## 2023-05-07 ENCOUNTER — Encounter: Payer: Self-pay | Admitting: Adult Health

## 2023-05-07 VITALS — BP 135/89 | HR 83 | Ht 72.0 in | Wt 374.0 lb

## 2023-05-07 DIAGNOSIS — G4733 Obstructive sleep apnea (adult) (pediatric): Secondary | ICD-10-CM | POA: Diagnosis not present

## 2024-05-10 ENCOUNTER — Encounter: Payer: Self-pay | Admitting: *Deleted

## 2024-05-11 ENCOUNTER — Telehealth (INDEPENDENT_AMBULATORY_CARE_PROVIDER_SITE_OTHER): Payer: 59 | Admitting: Adult Health

## 2024-05-11 DIAGNOSIS — G4733 Obstructive sleep apnea (adult) (pediatric): Secondary | ICD-10-CM

## 2024-05-11 NOTE — Progress Notes (Signed)
 PATIENT: Kelly Vargas DOB: [birth year]  REASON FOR VISIT: follow up HISTORY FROM: patient PRIMARY NEUROLOGIST: Dr. Buck   Virtual Visit via Video Note  I connected with Kelly Vargas on 05/11/24 at  3:15 PM EDT by a video enabled telemedicine application located remotely at [facility] and verified that I am speaking with the correct person using two identifiers who was located at their own home in [place]   I discussed the limitations of evaluation and management by telemedicine and the availability of in person appointments. The patient expressed understanding and agreed to proceed.   PATIENT: Kelly Vargas DOB: [birth year]  REASON FOR VISIT: follow up HISTORY FROM: patient  HISTORY OF PRESENT ILLNESS: Today 05/11/24  Kelly Vargas is a 53 y.o. female with a history of OSA on bipapa Returns today for follow-up.  Reports that bipap is working well.  Continues to find it beneficial.  Currently has fullface mask.  Download is below      HISTORY   REVIEW OF SYSTEMS: Out of a complete 14 system review of symptoms, the patient complains only of the following symptoms, and all other reviewed systems are negative.  ALLERGIES: Allergies  Allergen Reactions   Chamomile Swelling    Lips and mouth   Levomenol Swelling   Penicillins Hives    Did it involve swelling of the face/tongue/throat, SOB, or low BP? No Did it involve sudden or severe rash/hives, skin peeling, or any reaction on the inside of your mouth or nose? Yes Did you need to seek medical attention at a hospital or doctor's office? No When did it last happen?28 years ago If all above answers are "NO", may proceed with cephalosporin use.    Atorvastatin Other (See Comments)    Joint aches/pain   Erythromycin Nausea And Vomiting   Morphine  Nausea And Vomiting    HOME MEDICATIONS: Outpatient Medications Prior to Visit  Medication Sig Dispense Refill   acetaminophen  (TYLENOL ) 500 MG  tablet Take 1,000 mg by mouth every 6 (six) hours as needed for headache (pain).     albuterol  (VENTOLIN  HFA) 108 (90 Base) MCG/ACT inhaler Inhale 1-2 puffs into the lungs every 6 (six) hours as needed for wheezing or shortness of breath.     ALPRAZolam  (XANAX ) 0.5 MG tablet Take 0.5 mg by mouth 3 (three) times daily as needed for anxiety.      aspirin  EC 81 MG tablet Take 81 mg by mouth every morning.     Cholecalciferol  (VITAMIN D -3) 125 MCG (5000 UT) TABS Take 5,000 Units by mouth every morning.     COENZYME Q10-RED YEAST RICE PO Take 1 capsule by mouth every morning.     diphenhydrAMINE  (BENADRYL ) 25 MG tablet Take 25 mg by mouth at bedtime.     DULoxetine  (CYMBALTA ) 60 MG capsule Take 60 mg by mouth every morning.     famotidine  (PEPCID ) 20 MG tablet Take 20 mg by mouth every evening.      hydrochlorothiazide  (HYDRODIURIL ) 12.5 MG tablet Take 12.5 mg by mouth every morning.     lisinopril (ZESTRIL) 5 MG tablet Take 5 mg by mouth every morning.     metFORMIN  (GLUCOPHAGE -XR) 500 MG 24 hr tablet Take 1,000 mg by mouth 2 (two) times a day.     montelukast  (SINGULAIR ) 10 MG tablet Take 10 mg by mouth every morning.     Multiple Vitamin (MULTIVITAMIN WITH MINERALS) TABS tablet Take 1 tablet by mouth every morning. Women's One-A-Day  Multivitamin     naproxen sodium (ALEVE) 220 MG tablet Take 440 mg by mouth 2 (two) times daily as needed (pain).     Semaglutide (OZEMPIC, 2 MG/DOSE, [place]) Inject 2 mg into the skin every Friday.     spironolactone  (ALDACTONE ) 50 MG tablet TAKE 1 TABLET BY MOUTH  DAILY. TAKE WITH BREAKFAST. (Patient taking differently: Take 50 mg by mouth every morning.) 30 tablet 12   traZODone (DESYREL) 50 MG tablet Take 50 mg by mouth at bedtime.     Trolamine Salicylate (ASPERCREME EX) Apply 1 application topically 4 (four) times daily as needed (pain.).     Vilazodone HCl 20 MG TABS Take 20 mg by mouth at bedtime.     vitamin B-12 (CYANOCOBALAMIN ) 1000 MCG tablet Take 1,000 mcg by  mouth every morning.     No facility-administered medications prior to visit.    PAST MEDICAL HISTORY: Past Medical History:  Diagnosis Date   Anxiety    Asthma    Complication of anesthesia    Depression    Diabetes mellitus without complication (HCC)    GERD (gastroesophageal reflux disease)    Hernia    Hypertension    IBS (irritable bowel syndrome)    Migraine    Morbid obesity (HCC)    Polycystic ovarian disease    PONV (postoperative nausea and vomiting)    Rosacea    Sleep apnea    Small bowel obstruction (HCC)     PAST SURGICAL HISTORY: Past Surgical History:  Procedure Laterality Date   ADENOIDECTOMY     BACK SURGERY     DILATATION & CURETTAGE/HYSTEROSCOPY WITH MYOSURE N/A 02/17/2019   Procedure: DILATATION & CURETTAGE/HYSTEROSCOPY WITH MYOSURE;  Surgeon: Ozan, Jennifer, DO;  Location: [facility] OR;  Service: Gynecology;  Laterality: N/A;   HERNIA REPAIR     TONSILECTOMY, ADENOIDECTOMY, BILATERAL MYRINGOTOMY AND TUBES     UMBILICAL HERNIA REPAIR     ventrical hernia repair      FAMILY HISTORY: Family History  Problem Relation Age of Onset   Heart disease Mother    Cancer Mother    Stroke Mother    Diabetes Mother    Hypertension Mother    Heart attack Mother    Heart disease Father    Hypertension Brother    High Cholesterol Brother    Depression Brother     SOCIAL HISTORY: Social History   Socioeconomic History   Marital status: Legally Separated    Spouse name: [name]   Number of children: 0   Years of education: Market researcher.   Highest education level: Not on file  Occupational History    Employer: [employer]  Tobacco Use   Smoking status: Former    Current packs/day: 0.00    Average packs/day: 1 pack/day for 20.0 years (20.0 ttl pk-yrs)    Types: Cigarettes    Start date: 09/02/1986    Quit date: 09/02/2006    Years since quitting: 17.7   Smokeless tobacco: Never  Vaping Use   Vaping status: Never Used  Substance and Sexual Activity    Alcohol use: Not Currently   Drug use: No   Sexual activity: Yes    Partners: Female  Other Topics Concern   Not on file  Social History Narrative   Patient lives at home with spouse.   Caffeine Use: 2.5 cups of coffee daily   Social Drivers of Health   Financial Resource Strain: Low Risk  (06/16/2021)   Received from [facility]   Overall  Financial Resource Strain (CARDIA)    Difficulty of Paying Living Expenses: Not very hard  Food Insecurity: No Food Insecurity (06/16/2021)   Received from [facility]   Hunger Vital Sign    Within the past 12 months, you worried that your food would run out before you got the money to buy more.: Never true    Within the past 12 months, the food you bought just didn't last and you didn't have money to get more.: Never true  Transportation Needs: No Transportation Needs (06/16/2021)   Received from [facility] - Transportation    Lack of Transportation (Medical): No    Lack of Transportation (Non-Medical): No  Physical Activity: Inactive (06/16/2021)   Received from [facility]   Exercise Vital Sign    On average, how many days per week do you engage in moderate to strenuous exercise (like a brisk walk)?: 0 days    On average, how many minutes do you engage in exercise at this level?: 0 min  Stress: Stress Concern Present (06/16/2021)   Received from [facility] of Occupational Health - Occupational Stress Questionnaire    Feeling of Stress : Very much  Social Connections: Unknown (01/15/2022)   Received from [facility]   Social Network    Social Network: Not on file  Intimate Partner Violence: Unknown (12/07/2021)   Received from [facility]   HITS    Physically Hurt: Not on file    Insult or Talk Down To: Not on file    Threaten Physical Harm: Not on file    Scream or Curse: Not on file      PHYSICAL EXAM Generalized: Well developed, in no acute distress   Neurological examination   Mentation: Alert oriented to time, place, history taking. Follows all commands speech and language fluent Cranial nerve II-XII: Facial symmetry noted.   DIAGNOSTIC DATA (LABS, IMAGING, TESTING) - I reviewed patient records, labs, notes, testing and imaging myself where available.  Lab Results  Component Value Date   WBC 16.7 (H) 03/19/2021   HGB 17.0 (H) 03/19/2021   HCT 50.3 (H) 03/19/2021   MCV 89.3 03/19/2021   PLT 325 03/19/2021      Component Value Date/Time   NA 135 03/20/2021 0322   K 4.5 03/20/2021 0322   CL 103 03/20/2021 0322   CO2 22 03/20/2021 0322   GLUCOSE 165 (H) 03/20/2021 0322   BUN 20 03/20/2021 0322   CREATININE 0.70 03/20/2021 0322   CALCIUM 8.8 (L) 03/20/2021 0322   PROT 8.4 (H) 03/19/2021 1615   ALBUMIN 4.7 03/19/2021 1615   AST 29 03/19/2021 1615   ALT 22 03/19/2021 1615   ALKPHOS 69 03/19/2021 1615   BILITOT 0.3 03/19/2021 1615   GFRNONAA >60 03/20/2021 0322   GFRAA >60 11/02/2019 1651      ASSESSMENT AND PLAN 53 y.o. year old female  has a past medical history of Anxiety, Asthma, Complication of anesthesia, Depression, Diabetes mellitus without complication (HCC), GERD (gastroesophageal reflux disease), Hernia, Hypertension, IBS (irritable bowel syndrome), Migraine, Morbid obesity (HCC), Polycystic ovarian disease, PONV (postoperative nausea and vomiting), Rosacea, Sleep apnea, and Small bowel obstruction (HCC). here with:  OSA on Bipap  Bipap compliance excellent Residual AHI is good Encouraged patient to continue using Bipap nightly and > 4 hours each night F/U in 1 year or sooner if needed   Duwaine Russell, MSN, NP-C 05/11/2024, 3:03 PM [facility] [address] [place],  [place] [address] [phone]  The patient's condition requires frequent monitoring and adjustments in the treatment plan, reflecting the ongoing complexity of care.  This provider is the continuing focal point for all needed services for  this condition.

## 2025-05-12 ENCOUNTER — Telehealth: Admitting: Adult Health
